# Patient Record
Sex: Male | Born: 1960 | Race: Black or African American | Hispanic: No | Marital: Single | Smoking: Heavy tobacco smoker
Health system: Southern US, Community
[De-identification: ages and names within clinical notes are randomized; demographics above are authoritative.]

## PROBLEM LIST (undated history)

## (undated) DIAGNOSIS — N189 Chronic kidney disease, unspecified: Secondary | ICD-10-CM

## (undated) DIAGNOSIS — N419 Inflammatory disease of prostate, unspecified: Secondary | ICD-10-CM

## (undated) DIAGNOSIS — M549 Dorsalgia, unspecified: Secondary | ICD-10-CM

## (undated) DIAGNOSIS — M542 Cervicalgia: Secondary | ICD-10-CM

## (undated) DIAGNOSIS — I10 Essential (primary) hypertension: Secondary | ICD-10-CM

## (undated) DIAGNOSIS — Z8711 Personal history of peptic ulcer disease: Secondary | ICD-10-CM

## (undated) DIAGNOSIS — E119 Type 2 diabetes mellitus without complications: Secondary | ICD-10-CM

## (undated) DIAGNOSIS — G8929 Other chronic pain: Secondary | ICD-10-CM

## (undated) DIAGNOSIS — B1920 Unspecified viral hepatitis C without hepatic coma: Secondary | ICD-10-CM

## (undated) DIAGNOSIS — G629 Polyneuropathy, unspecified: Secondary | ICD-10-CM

## (undated) DIAGNOSIS — Z8719 Personal history of other diseases of the digestive system: Secondary | ICD-10-CM

## (undated) DIAGNOSIS — J40 Bronchitis, not specified as acute or chronic: Secondary | ICD-10-CM

## (undated) DIAGNOSIS — K859 Acute pancreatitis without necrosis or infection, unspecified: Secondary | ICD-10-CM

## (undated) DIAGNOSIS — Z87898 Personal history of other specified conditions: Secondary | ICD-10-CM

## (undated) DIAGNOSIS — T8859XA Other complications of anesthesia, initial encounter: Secondary | ICD-10-CM

## (undated) DIAGNOSIS — Z8489 Family history of other specified conditions: Secondary | ICD-10-CM

## (undated) DIAGNOSIS — K862 Cyst of pancreas: Secondary | ICD-10-CM

## (undated) DIAGNOSIS — R0602 Shortness of breath: Secondary | ICD-10-CM

## (undated) DIAGNOSIS — K219 Gastro-esophageal reflux disease without esophagitis: Secondary | ICD-10-CM

## (undated) DIAGNOSIS — T4145XA Adverse effect of unspecified anesthetic, initial encounter: Secondary | ICD-10-CM

## (undated) DIAGNOSIS — E162 Hypoglycemia, unspecified: Secondary | ICD-10-CM

## (undated) DIAGNOSIS — C259 Malignant neoplasm of pancreas, unspecified: Secondary | ICD-10-CM

## (undated) DIAGNOSIS — I209 Angina pectoris, unspecified: Secondary | ICD-10-CM

## (undated) DIAGNOSIS — R768 Other specified abnormal immunological findings in serum: Secondary | ICD-10-CM

## (undated) HISTORY — DX: Personal history of other specified conditions: Z87.898

## (undated) HISTORY — DX: Essential (primary) hypertension: I10

## (undated) HISTORY — DX: Other chronic pain: G89.29

## (undated) HISTORY — DX: Other specified abnormal immunological findings in serum: R76.8

## (undated) HISTORY — DX: Chronic kidney disease, unspecified: N18.9

## (undated) HISTORY — DX: Cyst of pancreas: K86.2

## (undated) HISTORY — DX: Bronchitis, not specified as acute or chronic: J40

## (undated) HISTORY — PX: HERNIA REPAIR: SHX51

## (undated) HISTORY — DX: Gastro-esophageal reflux disease without esophagitis: K21.9

## (undated) HISTORY — PX: PANCREATIC CYST DRAINAGE: SHX2156

---
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
## 1997-09-09 ENCOUNTER — Emergency Department (HOSPITAL_COMMUNITY): Admission: EM | Admit: 1997-09-09 | Discharge: 1997-09-10 | Payer: Self-pay | Admitting: Emergency Medicine

## 1997-09-17 ENCOUNTER — Emergency Department (HOSPITAL_COMMUNITY): Admission: EM | Admit: 1997-09-17 | Discharge: 1997-09-17 | Payer: Self-pay | Admitting: Emergency Medicine

## 1997-09-18 ENCOUNTER — Emergency Department (HOSPITAL_COMMUNITY): Admission: EM | Admit: 1997-09-18 | Discharge: 1997-09-18 | Payer: Self-pay | Admitting: Emergency Medicine

## 1998-09-30 ENCOUNTER — Emergency Department (HOSPITAL_COMMUNITY): Admission: EM | Admit: 1998-09-30 | Discharge: 1998-09-30 | Payer: Self-pay | Admitting: *Deleted

## 1998-12-09 ENCOUNTER — Emergency Department (HOSPITAL_COMMUNITY): Admission: EM | Admit: 1998-12-09 | Discharge: 1998-12-09 | Payer: Self-pay | Admitting: Emergency Medicine

## 1999-02-19 ENCOUNTER — Emergency Department (HOSPITAL_COMMUNITY): Admission: EM | Admit: 1999-02-19 | Discharge: 1999-02-19 | Payer: Self-pay | Admitting: Emergency Medicine

## 1999-03-04 ENCOUNTER — Ambulatory Visit (HOSPITAL_COMMUNITY): Admission: RE | Admit: 1999-03-04 | Discharge: 1999-03-04 | Payer: Self-pay | Admitting: *Deleted

## 1999-03-04 ENCOUNTER — Encounter: Payer: Self-pay | Admitting: *Deleted

## 1999-05-20 ENCOUNTER — Emergency Department (HOSPITAL_COMMUNITY): Admission: EM | Admit: 1999-05-20 | Discharge: 1999-05-20 | Payer: Self-pay | Admitting: Emergency Medicine

## 2000-01-11 ENCOUNTER — Emergency Department (HOSPITAL_COMMUNITY): Admission: EM | Admit: 2000-01-11 | Discharge: 2000-01-11 | Payer: Self-pay | Admitting: Emergency Medicine

## 2000-01-17 ENCOUNTER — Emergency Department (HOSPITAL_COMMUNITY): Admission: EM | Admit: 2000-01-17 | Discharge: 2000-01-17 | Payer: Self-pay

## 2000-01-23 ENCOUNTER — Emergency Department (HOSPITAL_COMMUNITY): Admission: EM | Admit: 2000-01-23 | Discharge: 2000-01-24 | Payer: Self-pay | Admitting: Emergency Medicine

## 2000-01-24 ENCOUNTER — Encounter: Payer: Self-pay | Admitting: Emergency Medicine

## 2000-01-26 ENCOUNTER — Emergency Department (HOSPITAL_COMMUNITY): Admission: EM | Admit: 2000-01-26 | Discharge: 2000-01-27 | Payer: Self-pay | Admitting: Emergency Medicine

## 2000-01-29 ENCOUNTER — Emergency Department (HOSPITAL_COMMUNITY): Admission: EM | Admit: 2000-01-29 | Discharge: 2000-01-29 | Payer: Self-pay

## 2000-02-02 ENCOUNTER — Encounter: Admission: RE | Admit: 2000-02-02 | Discharge: 2000-02-02 | Payer: Self-pay | Admitting: Internal Medicine

## 2000-02-16 ENCOUNTER — Encounter: Admission: RE | Admit: 2000-02-16 | Discharge: 2000-02-16 | Payer: Self-pay | Admitting: Internal Medicine

## 2000-02-23 ENCOUNTER — Encounter: Admission: RE | Admit: 2000-02-23 | Discharge: 2000-02-23 | Payer: Self-pay

## 2000-03-30 ENCOUNTER — Encounter: Admission: RE | Admit: 2000-03-30 | Discharge: 2000-03-30 | Payer: Self-pay | Admitting: Internal Medicine

## 2000-04-09 ENCOUNTER — Encounter: Admission: RE | Admit: 2000-04-09 | Discharge: 2000-04-09 | Payer: Self-pay

## 2000-04-12 ENCOUNTER — Encounter: Admission: RE | Admit: 2000-04-12 | Discharge: 2000-04-12 | Payer: Self-pay | Admitting: Internal Medicine

## 2000-04-14 ENCOUNTER — Encounter: Admission: RE | Admit: 2000-04-14 | Discharge: 2000-04-14 | Payer: Self-pay | Admitting: Hematology and Oncology

## 2000-07-27 ENCOUNTER — Emergency Department (HOSPITAL_COMMUNITY): Admission: EM | Admit: 2000-07-27 | Discharge: 2000-07-27 | Payer: Self-pay | Admitting: *Deleted

## 2000-09-05 ENCOUNTER — Emergency Department (HOSPITAL_COMMUNITY): Admission: EM | Admit: 2000-09-05 | Discharge: 2000-09-06 | Payer: Self-pay

## 2000-10-14 ENCOUNTER — Encounter: Admission: RE | Admit: 2000-10-14 | Discharge: 2000-10-14 | Payer: Self-pay | Admitting: Internal Medicine

## 2001-03-24 ENCOUNTER — Emergency Department (HOSPITAL_COMMUNITY): Admission: EM | Admit: 2001-03-24 | Discharge: 2001-03-24 | Payer: Self-pay | Admitting: *Deleted

## 2001-04-06 ENCOUNTER — Emergency Department (HOSPITAL_COMMUNITY): Admission: EM | Admit: 2001-04-06 | Discharge: 2001-04-06 | Payer: Self-pay | Admitting: *Deleted

## 2001-05-23 ENCOUNTER — Encounter: Admission: RE | Admit: 2001-05-23 | Discharge: 2001-05-23 | Payer: Self-pay

## 2001-06-08 ENCOUNTER — Emergency Department (HOSPITAL_COMMUNITY): Admission: EM | Admit: 2001-06-08 | Discharge: 2001-06-08 | Payer: Self-pay | Admitting: Emergency Medicine

## 2001-07-17 ENCOUNTER — Emergency Department (HOSPITAL_COMMUNITY): Admission: EM | Admit: 2001-07-17 | Discharge: 2001-07-17 | Payer: Self-pay | Admitting: Emergency Medicine

## 2001-07-18 ENCOUNTER — Encounter: Admission: RE | Admit: 2001-07-18 | Discharge: 2001-07-18 | Payer: Self-pay | Admitting: Internal Medicine

## 2001-07-26 ENCOUNTER — Encounter: Admission: RE | Admit: 2001-07-26 | Discharge: 2001-07-26 | Payer: Self-pay | Admitting: Internal Medicine

## 2001-08-10 ENCOUNTER — Encounter: Admission: RE | Admit: 2001-08-10 | Discharge: 2001-08-10 | Payer: Self-pay | Admitting: Internal Medicine

## 2001-09-01 ENCOUNTER — Encounter: Admission: RE | Admit: 2001-09-01 | Discharge: 2001-09-01 | Payer: Self-pay | Admitting: Internal Medicine

## 2001-09-16 ENCOUNTER — Ambulatory Visit (HOSPITAL_COMMUNITY): Admission: RE | Admit: 2001-09-16 | Discharge: 2001-09-16 | Payer: Self-pay | Admitting: Urology

## 2001-09-16 ENCOUNTER — Encounter: Payer: Self-pay | Admitting: Urology

## 2001-10-25 ENCOUNTER — Encounter: Admission: RE | Admit: 2001-10-25 | Discharge: 2001-10-25 | Payer: Self-pay | Admitting: Internal Medicine

## 2001-10-30 ENCOUNTER — Emergency Department (HOSPITAL_COMMUNITY): Admission: EM | Admit: 2001-10-30 | Discharge: 2001-10-31 | Payer: Self-pay | Admitting: Emergency Medicine

## 2001-11-29 ENCOUNTER — Encounter: Admission: RE | Admit: 2001-11-29 | Discharge: 2001-11-29 | Payer: Self-pay | Admitting: Internal Medicine

## 2001-12-29 ENCOUNTER — Encounter: Admission: RE | Admit: 2001-12-29 | Discharge: 2001-12-29 | Payer: Self-pay | Admitting: Internal Medicine

## 2002-02-02 ENCOUNTER — Encounter: Admission: RE | Admit: 2002-02-02 | Discharge: 2002-02-02 | Payer: Self-pay | Admitting: Internal Medicine

## 2002-03-07 ENCOUNTER — Emergency Department (HOSPITAL_COMMUNITY): Admission: EM | Admit: 2002-03-07 | Discharge: 2002-03-08 | Payer: Self-pay | Admitting: Emergency Medicine

## 2002-03-08 ENCOUNTER — Encounter: Payer: Self-pay | Admitting: Emergency Medicine

## 2002-08-09 ENCOUNTER — Emergency Department (HOSPITAL_COMMUNITY): Admission: EM | Admit: 2002-08-09 | Discharge: 2002-08-10 | Payer: Self-pay | Admitting: Emergency Medicine

## 2002-12-25 ENCOUNTER — Emergency Department (HOSPITAL_COMMUNITY): Admission: EM | Admit: 2002-12-25 | Discharge: 2002-12-25 | Payer: Self-pay | Admitting: Emergency Medicine

## 2003-02-14 ENCOUNTER — Emergency Department (HOSPITAL_COMMUNITY): Admission: EM | Admit: 2003-02-14 | Discharge: 2003-02-14 | Payer: Self-pay

## 2003-04-19 ENCOUNTER — Emergency Department (HOSPITAL_COMMUNITY): Admission: EM | Admit: 2003-04-19 | Discharge: 2003-04-19 | Payer: Self-pay | Admitting: Emergency Medicine

## 2003-10-15 ENCOUNTER — Emergency Department (HOSPITAL_COMMUNITY): Admission: EM | Admit: 2003-10-15 | Discharge: 2003-10-15 | Payer: Self-pay | Admitting: Emergency Medicine

## 2004-12-21 ENCOUNTER — Emergency Department (HOSPITAL_COMMUNITY): Admission: EM | Admit: 2004-12-21 | Discharge: 2004-12-21 | Payer: Self-pay | Admitting: Emergency Medicine

## 2005-03-13 ENCOUNTER — Emergency Department (HOSPITAL_COMMUNITY): Admission: EM | Admit: 2005-03-13 | Discharge: 2005-03-13 | Payer: Self-pay | Admitting: Emergency Medicine

## 2005-03-16 ENCOUNTER — Emergency Department (HOSPITAL_COMMUNITY): Admission: EM | Admit: 2005-03-16 | Discharge: 2005-03-16 | Payer: Self-pay | Admitting: Family Medicine

## 2005-09-15 ENCOUNTER — Emergency Department (HOSPITAL_COMMUNITY): Admission: EM | Admit: 2005-09-15 | Discharge: 2005-09-15 | Payer: Self-pay | Admitting: Emergency Medicine

## 2005-09-18 ENCOUNTER — Ambulatory Visit: Payer: Self-pay | Admitting: Internal Medicine

## 2005-09-18 ENCOUNTER — Ambulatory Visit (HOSPITAL_COMMUNITY): Admission: RE | Admit: 2005-09-18 | Discharge: 2005-09-18 | Payer: Self-pay | Admitting: Internal Medicine

## 2005-10-02 ENCOUNTER — Ambulatory Visit: Payer: Self-pay | Admitting: Internal Medicine

## 2006-01-19 HISTORY — PX: CARDIAC CATHETERIZATION: SHX172

## 2006-09-01 ENCOUNTER — Emergency Department (HOSPITAL_COMMUNITY): Admission: EM | Admit: 2006-09-01 | Discharge: 2006-09-01 | Payer: Self-pay | Admitting: Emergency Medicine

## 2006-09-08 ENCOUNTER — Emergency Department (HOSPITAL_COMMUNITY): Admission: EM | Admit: 2006-09-08 | Discharge: 2006-09-08 | Payer: Self-pay | Admitting: Emergency Medicine

## 2006-11-17 ENCOUNTER — Emergency Department (HOSPITAL_COMMUNITY): Admission: EM | Admit: 2006-11-17 | Discharge: 2006-11-18 | Payer: Self-pay | Admitting: Emergency Medicine

## 2007-01-06 ENCOUNTER — Ambulatory Visit: Payer: Self-pay | Admitting: Internal Medicine

## 2007-01-06 ENCOUNTER — Observation Stay (HOSPITAL_COMMUNITY): Admission: EM | Admit: 2007-01-06 | Discharge: 2007-01-06 | Payer: Self-pay | Admitting: Emergency Medicine

## 2007-01-06 ENCOUNTER — Encounter (INDEPENDENT_AMBULATORY_CARE_PROVIDER_SITE_OTHER): Payer: Self-pay | Admitting: Cardiovascular Disease

## 2007-01-24 ENCOUNTER — Telehealth: Payer: Self-pay | Admitting: Internal Medicine

## 2007-01-24 ENCOUNTER — Ambulatory Visit: Payer: Self-pay | Admitting: Internal Medicine

## 2007-01-24 DIAGNOSIS — I10 Essential (primary) hypertension: Secondary | ICD-10-CM | POA: Insufficient documentation

## 2007-01-24 DIAGNOSIS — K219 Gastro-esophageal reflux disease without esophagitis: Secondary | ICD-10-CM

## 2007-01-25 ENCOUNTER — Encounter (INDEPENDENT_AMBULATORY_CARE_PROVIDER_SITE_OTHER): Payer: Self-pay | Admitting: Internal Medicine

## 2007-01-28 ENCOUNTER — Encounter (INDEPENDENT_AMBULATORY_CARE_PROVIDER_SITE_OTHER): Payer: Self-pay | Admitting: Internal Medicine

## 2007-01-28 LAB — CONVERTED CEMR LAB
Albumin: 4.9 g/dL (ref 3.5–5.2)
CO2: 28 meq/L (ref 19–32)
Calcium: 9.8 mg/dL (ref 8.4–10.5)
Chloride: 98 meq/L (ref 96–112)
Eosinophils Relative: 2 % (ref 0–5)
Glucose, Bld: 97 mg/dL (ref 70–99)
HCT: 44.7 % (ref 39.0–52.0)
Hemoglobin, Urine: NEGATIVE
Hemoglobin: 15.5 g/dL (ref 13.0–17.0)
Ketones, ur: NEGATIVE mg/dL
Leukocytes, UA: NEGATIVE
Lipase: 51 units/L (ref 0–75)
Lymphocytes Relative: 47 % — ABNORMAL HIGH (ref 12–46)
Lymphs Abs: 3.1 10*3/uL (ref 0.7–4.0)
Neutro Abs: 2.7 10*3/uL (ref 1.7–7.7)
Nitrite: NEGATIVE
Platelets: 297 10*3/uL (ref 150–400)
Protein, ur: 100 mg/dL — AB
Sodium: 139 meq/L (ref 135–145)
Total Bilirubin: 0.8 mg/dL (ref 0.3–1.2)
Total Protein: 7.8 g/dL (ref 6.0–8.3)
Urobilinogen, UA: 1 (ref 0.0–1.0)
WBC: 6.6 10*3/uL (ref 4.0–10.5)

## 2007-02-22 ENCOUNTER — Emergency Department (HOSPITAL_COMMUNITY): Admission: EM | Admit: 2007-02-22 | Discharge: 2007-02-22 | Payer: Self-pay | Admitting: Family Medicine

## 2007-03-09 ENCOUNTER — Emergency Department (HOSPITAL_COMMUNITY): Admission: EM | Admit: 2007-03-09 | Discharge: 2007-03-09 | Payer: Self-pay | Admitting: Emergency Medicine

## 2007-05-05 ENCOUNTER — Emergency Department (HOSPITAL_COMMUNITY): Admission: EM | Admit: 2007-05-05 | Discharge: 2007-05-05 | Payer: Self-pay | Admitting: Family Medicine

## 2007-05-23 ENCOUNTER — Emergency Department (HOSPITAL_COMMUNITY): Admission: EM | Admit: 2007-05-23 | Discharge: 2007-05-23 | Payer: Self-pay | Admitting: Emergency Medicine

## 2007-07-08 ENCOUNTER — Emergency Department (HOSPITAL_COMMUNITY): Admission: EM | Admit: 2007-07-08 | Discharge: 2007-07-08 | Payer: Self-pay | Admitting: Emergency Medicine

## 2007-07-26 ENCOUNTER — Ambulatory Visit: Payer: Self-pay | Admitting: Internal Medicine

## 2007-07-26 ENCOUNTER — Encounter (INDEPENDENT_AMBULATORY_CARE_PROVIDER_SITE_OTHER): Payer: Self-pay | Admitting: Internal Medicine

## 2007-07-26 DIAGNOSIS — Z87898 Personal history of other specified conditions: Secondary | ICD-10-CM

## 2007-07-26 HISTORY — DX: Personal history of other specified conditions: Z87.898

## 2007-08-02 LAB — CONVERTED CEMR LAB
CO2: 25 meq/L (ref 19–32)
Calcium: 9.8 mg/dL (ref 8.4–10.5)
Chloride: 96 meq/L (ref 96–112)
Creatinine, Ser: 1.26 mg/dL (ref 0.40–1.50)
Glucose, Bld: 99 mg/dL (ref 70–99)
Potassium: 3.8 meq/L (ref 3.5–5.3)
Sodium: 136 meq/L (ref 135–145)

## 2007-08-03 ENCOUNTER — Ambulatory Visit: Payer: Self-pay | Admitting: Infectious Diseases

## 2007-08-03 ENCOUNTER — Encounter (INDEPENDENT_AMBULATORY_CARE_PROVIDER_SITE_OTHER): Payer: Self-pay | Admitting: Internal Medicine

## 2007-09-20 ENCOUNTER — Emergency Department (HOSPITAL_COMMUNITY): Admission: EM | Admit: 2007-09-20 | Discharge: 2007-09-20 | Payer: Self-pay | Admitting: Family Medicine

## 2007-12-13 ENCOUNTER — Emergency Department (HOSPITAL_COMMUNITY): Admission: EM | Admit: 2007-12-13 | Discharge: 2007-12-13 | Payer: Self-pay | Admitting: Family Medicine

## 2008-02-13 ENCOUNTER — Telehealth (INDEPENDENT_AMBULATORY_CARE_PROVIDER_SITE_OTHER): Payer: Self-pay | Admitting: Internal Medicine

## 2008-02-27 ENCOUNTER — Emergency Department (HOSPITAL_COMMUNITY): Admission: EM | Admit: 2008-02-27 | Discharge: 2008-02-27 | Payer: Self-pay | Admitting: Emergency Medicine

## 2008-03-30 ENCOUNTER — Telehealth (INDEPENDENT_AMBULATORY_CARE_PROVIDER_SITE_OTHER): Payer: Self-pay | Admitting: Internal Medicine

## 2008-06-06 ENCOUNTER — Emergency Department (HOSPITAL_COMMUNITY): Admission: EM | Admit: 2008-06-06 | Discharge: 2008-06-06 | Payer: Self-pay | Admitting: Family Medicine

## 2008-06-06 ENCOUNTER — Ambulatory Visit: Payer: Self-pay | Admitting: Infectious Disease

## 2008-06-06 ENCOUNTER — Encounter (INDEPENDENT_AMBULATORY_CARE_PROVIDER_SITE_OTHER): Payer: Self-pay | Admitting: Internal Medicine

## 2008-06-06 DIAGNOSIS — D409 Neoplasm of uncertain behavior of male genital organ, unspecified: Secondary | ICD-10-CM

## 2008-06-06 LAB — CONVERTED CEMR LAB
Basophils Absolute: 0 10*3/uL
Basophils Relative: 1 %
Bilirubin Urine: NEGATIVE
Chlamydia, Swab/Urine, PCR: NEGATIVE
Cholesterol: 197 mg/dL
Eosinophils Absolute: 0.1 10*3/uL
Eosinophils Relative: 1 %
GC Probe Amp, Urine: NEGATIVE
HCT: 46.9 %
HDL: 46 mg/dL
Hemoglobin, Urine: NEGATIVE
Hemoglobin: 16.2 g/dL
Ketones, ur: NEGATIVE mg/dL
LDL Cholesterol: 132 mg/dL — ABNORMAL HIGH
Leukocytes, UA: NEGATIVE
Lymphocytes Relative: 50 % — ABNORMAL HIGH
Lymphs Abs: 3.1 10*3/uL
MCHC: 34.5 g/dL
MCV: 89 fL
Monocytes Absolute: 0.7 10*3/uL
Monocytes Relative: 10 %
Neutro Abs: 2.4 10*3/uL
Neutrophils Relative %: 38 % — ABNORMAL LOW
Nitrite: NEGATIVE
Platelets: 316 10*3/uL
Protein, ur: 100 mg/dL — AB
RBC / HPF: NONE SEEN
RBC: 5.27 M/uL
RDW: 13.4 %
Specific Gravity, Urine: 1.028
Total CHOL/HDL Ratio: 4.3
Triglycerides: 97 mg/dL
Urine Glucose: NEGATIVE mg/dL
Urobilinogen, UA: 1
VLDL: 19 mg/dL
WBC: 6.3 10*3/uL
pH: 5.5

## 2008-06-19 ENCOUNTER — Encounter (INDEPENDENT_AMBULATORY_CARE_PROVIDER_SITE_OTHER): Payer: Self-pay | Admitting: Internal Medicine

## 2008-06-19 ENCOUNTER — Ambulatory Visit: Payer: Self-pay | Admitting: Infectious Disease

## 2008-06-20 LAB — CONVERTED CEMR LAB
Calcium: 10.9 mg/dL — ABNORMAL HIGH (ref 8.4–10.5)
Creatinine, Ser: 1.55 mg/dL — ABNORMAL HIGH (ref 0.40–1.50)
GFR calc Af Amer: 58 mL/min — ABNORMAL LOW (ref >60)
GFR calc non Af Amer: 48 mL/min — ABNORMAL LOW (ref >60)
Sodium: 138 meq/L (ref 135–145)

## 2008-06-25 ENCOUNTER — Encounter (INDEPENDENT_AMBULATORY_CARE_PROVIDER_SITE_OTHER): Payer: Self-pay | Admitting: Internal Medicine

## 2008-06-25 ENCOUNTER — Ambulatory Visit: Payer: Self-pay | Admitting: Internal Medicine

## 2008-06-25 LAB — CONVERTED CEMR LAB
Calcium: 9.6 mg/dL (ref 8.4–10.5)
GFR calc Af Amer: >60 mL/min (ref >60)
GFR calc non Af Amer: >60 mL/min (ref >60)
Glucose, Bld: 106 mg/dL — ABNORMAL HIGH (ref 70–99)
Potassium: 3.9 meq/L (ref 3.5–5.3)
Sodium: 138 meq/L (ref 135–145)

## 2008-07-16 ENCOUNTER — Telehealth: Payer: Self-pay | Admitting: *Deleted

## 2008-07-16 ENCOUNTER — Encounter (INDEPENDENT_AMBULATORY_CARE_PROVIDER_SITE_OTHER): Payer: Self-pay | Admitting: Internal Medicine

## 2008-07-25 ENCOUNTER — Telehealth (INDEPENDENT_AMBULATORY_CARE_PROVIDER_SITE_OTHER): Payer: Self-pay | Admitting: Internal Medicine

## 2008-11-13 ENCOUNTER — Telehealth (INDEPENDENT_AMBULATORY_CARE_PROVIDER_SITE_OTHER): Payer: Self-pay | Admitting: Internal Medicine

## 2009-01-16 ENCOUNTER — Telehealth (INDEPENDENT_AMBULATORY_CARE_PROVIDER_SITE_OTHER): Payer: Self-pay | Admitting: Internal Medicine

## 2009-02-06 ENCOUNTER — Telehealth (INDEPENDENT_AMBULATORY_CARE_PROVIDER_SITE_OTHER): Payer: Self-pay | Admitting: Internal Medicine

## 2009-02-12 ENCOUNTER — Emergency Department (HOSPITAL_COMMUNITY): Admission: EM | Admit: 2009-02-12 | Discharge: 2009-02-12 | Payer: Self-pay | Admitting: Emergency Medicine

## 2009-02-28 ENCOUNTER — Ambulatory Visit: Payer: Self-pay | Admitting: Internal Medicine

## 2009-03-06 ENCOUNTER — Telehealth (INDEPENDENT_AMBULATORY_CARE_PROVIDER_SITE_OTHER): Payer: Self-pay | Admitting: Internal Medicine

## 2009-04-06 ENCOUNTER — Emergency Department (HOSPITAL_COMMUNITY): Admission: EM | Admit: 2009-04-06 | Discharge: 2009-04-06 | Payer: Self-pay | Admitting: Family Medicine

## 2009-04-11 ENCOUNTER — Encounter (INDEPENDENT_AMBULATORY_CARE_PROVIDER_SITE_OTHER): Payer: Self-pay | Admitting: Internal Medicine

## 2009-04-11 ENCOUNTER — Telehealth: Payer: Self-pay | Admitting: Internal Medicine

## 2009-05-22 ENCOUNTER — Telehealth (INDEPENDENT_AMBULATORY_CARE_PROVIDER_SITE_OTHER): Payer: Self-pay | Admitting: Internal Medicine

## 2009-06-24 ENCOUNTER — Telehealth: Payer: Self-pay | Admitting: *Deleted

## 2009-06-25 ENCOUNTER — Ambulatory Visit: Payer: Self-pay | Admitting: Internal Medicine

## 2009-07-30 ENCOUNTER — Emergency Department (HOSPITAL_COMMUNITY): Admission: EM | Admit: 2009-07-30 | Discharge: 2009-07-30 | Payer: Self-pay | Admitting: Emergency Medicine

## 2009-08-12 ENCOUNTER — Telehealth (INDEPENDENT_AMBULATORY_CARE_PROVIDER_SITE_OTHER): Payer: Self-pay | Admitting: *Deleted

## 2009-08-12 ENCOUNTER — Telehealth: Payer: Self-pay | Admitting: Internal Medicine

## 2009-08-15 ENCOUNTER — Encounter: Payer: Self-pay | Admitting: Licensed Clinical Social Worker

## 2009-08-15 ENCOUNTER — Ambulatory Visit: Payer: Self-pay | Admitting: Internal Medicine

## 2009-08-15 DIAGNOSIS — F17200 Nicotine dependence, unspecified, uncomplicated: Secondary | ICD-10-CM | POA: Insufficient documentation

## 2009-08-15 LAB — CONVERTED CEMR LAB
ALT: 39 units/L (ref 0–53)
AST: 21 units/L (ref 0–37)
Albumin: 4.5 g/dL (ref 3.5–5.2)
Alkaline Phosphatase: 82 units/L (ref 39–117)
BUN: 14 mg/dL (ref 6–23)
HDL: 49 mg/dL (ref >39)
LDL Cholesterol: 133 mg/dL — ABNORMAL HIGH (ref 0–99)
Potassium: 4.3 meq/L (ref 3.5–5.3)
Total CHOL/HDL Ratio: 4.2

## 2009-09-03 DIAGNOSIS — J45909 Unspecified asthma, uncomplicated: Secondary | ICD-10-CM | POA: Insufficient documentation

## 2009-09-03 DIAGNOSIS — I2510 Atherosclerotic heart disease of native coronary artery without angina pectoris: Secondary | ICD-10-CM

## 2009-09-10 ENCOUNTER — Ambulatory Visit: Payer: Self-pay | Admitting: Cardiovascular Disease

## 2009-10-01 ENCOUNTER — Ambulatory Visit: Payer: Self-pay

## 2009-10-01 ENCOUNTER — Ambulatory Visit: Payer: Self-pay | Admitting: Cardiovascular Disease

## 2009-10-17 ENCOUNTER — Telehealth (INDEPENDENT_AMBULATORY_CARE_PROVIDER_SITE_OTHER): Payer: Self-pay | Admitting: *Deleted

## 2009-10-23 ENCOUNTER — Ambulatory Visit: Payer: Self-pay | Admitting: Cardiovascular Disease

## 2009-10-23 LAB — CONVERTED CEMR LAB
Calcium: 9.7 mg/dL (ref 8.4–10.5)
Chloride: 105 meq/L (ref 96–112)
Creatinine, Ser: 1.1 mg/dL (ref 0.4–1.5)
Sodium: 141 meq/L (ref 135–145)

## 2010-01-08 ENCOUNTER — Telehealth: Payer: Self-pay | Admitting: Internal Medicine

## 2010-01-15 ENCOUNTER — Ambulatory Visit: Admission: RE | Admit: 2010-01-15 | Discharge: 2010-01-15 | Payer: Self-pay | Source: Home / Self Care

## 2010-01-15 DIAGNOSIS — R300 Dysuria: Secondary | ICD-10-CM | POA: Insufficient documentation

## 2010-01-15 DIAGNOSIS — R809 Proteinuria, unspecified: Secondary | ICD-10-CM

## 2010-01-15 LAB — CONVERTED CEMR LAB
BUN: 9 mg/dL (ref 6–23)
CO2: 29 meq/L (ref 19–32)
Chlamydia, DNA Probe: NEGATIVE
Chloride: 104 meq/L (ref 96–112)
Glucose, Bld: 99 mg/dL (ref 70–99)
Glucose, Urine, Semiquant: NEGATIVE
Lymphocytes Relative: 44 % (ref 12–46)
Lymphs Abs: 3.2 10*3/uL (ref 0.7–4.0)
Monocytes Relative: 8 % (ref 3–12)
Neutro Abs: 3.4 10*3/uL (ref 1.7–7.7)
Neutrophils Relative %: 46 % (ref 43–77)
Nitrite: NEGATIVE
Potassium: 4.2 meq/L (ref 3.5–5.3)
RBC: 5.47 M/uL (ref 4.22–5.81)
Specific Gravity, Urine: 1.03 (ref 1.005–1.03)
Urine Glucose: NEGATIVE mg/dL
Urobilinogen, UA: 1
WBC: 7.3 10*3/uL (ref 4.0–10.5)
pH: 5
pH: 5.5 (ref 5.0–8.0)

## 2010-01-19 HISTORY — PX: CARDIAC CATHETERIZATION: SHX172

## 2010-02-07 ENCOUNTER — Telehealth (INDEPENDENT_AMBULATORY_CARE_PROVIDER_SITE_OTHER): Payer: Self-pay | Admitting: *Deleted

## 2010-02-13 ENCOUNTER — Ambulatory Visit: Admission: RE | Admit: 2010-02-13 | Discharge: 2010-02-13 | Payer: Self-pay | Source: Home / Self Care

## 2010-02-13 DIAGNOSIS — N419 Inflammatory disease of prostate, unspecified: Secondary | ICD-10-CM | POA: Insufficient documentation

## 2010-02-13 LAB — CONVERTED CEMR LAB
ALT: 41 units/L (ref 0–53)
Alkaline Phosphatase: 93 units/L (ref 39–117)
Bilirubin Urine: NEGATIVE
CO2: 27 meq/L (ref 19–32)
Casts: NONE SEEN /lpf
Creatinine, Ser: 1.32 mg/dL (ref 0.40–1.50)
Eosinophils Absolute: 0.1 10*3/uL (ref 0.0–0.7)
Eosinophils Relative: 2 % (ref 0–5)
HCT: 47.9 % (ref 39.0–52.0)
Ketones, ur: NEGATIVE mg/dL
Lymphs Abs: 4 10*3/uL (ref 0.7–4.0)
MCHC: 35.7 g/dL (ref 30.0–36.0)
MCV: 89 fL (ref 78.0–100.0)
Monocytes Relative: 10 % (ref 3–12)
Platelets: 286 10*3/uL (ref 150–400)
RBC: 5.38 M/uL (ref 4.22–5.81)
Total Bilirubin: 0.8 mg/dL (ref 0.3–1.2)
Urine Glucose: NEGATIVE mg/dL
WBC: 7.6 10*3/uL (ref 4.0–10.5)
pH: 5.5 (ref 5.0–8.0)

## 2010-02-20 NOTE — Medication Information (Signed)
Summary: [place] [employer]  [place] [employer]   Imported By: Florinda Marker 04/15/2009 10:36:56  _____________________________________________________________________  External Attachment:    Type:   Image     Comment:   External Document

## 2010-02-20 NOTE — Assessment & Plan Note (Signed)
Summary: PROSTATE/SB.   Vital Signs:  Patient profile:   50 year old male Height:      67 inches (170.18 cm) Weight:      179.7 pounds (81.68 kg) BMI:     28.25 Temp:     100.2 degrees F (37.89 degrees C) oral Pulse rate:   88 / minute BP sitting:   140 / 88  (left arm)  Vitals Entered By: Stanton Kidney Ditzler RN (January 15, 2010 1:44 PM) Is Patient Diabetic? No Pain Assessment Patient in pain? yes     Location: left side prostate Intensity: 5 Type: sharp Onset of pain  off and on past week Nutritional Status BMI of 25 - 29 = overweight Nutritional Status Detail appetite good  Have you ever been in a relationship where you felt threatened, hurt or afraid?denies   Does patient need assistance? Functional Status Self care Ambulation Normal Comments Leaking and urgency urination. Accidents at night. Sl discharge. ? knot off and on.   Primary Care Provider:  Rosana Berger MD   History of Present Illness: 50 yo male with PMH outlined below presents to [facility] with main concern of 2 week dysuria, urinary frequency, urgency, dribbling, and sense of incomplete voiding. This has been getting worse and pain is not 7/10 in severity radiating to the lower back area. Aggrevated by bowel movements and urination. He has had similar episodes in the past but can not recall the exact cause of it. These episodes have been associtaed with fevers and chills but no other systemic concerns, no abd concerns. No recent changes in weight, appetite. He reports being sexualy active and has history of "some" STD, currently uses no protection. Denies blood in urine or stool.   Depression History:      The patient denies a depressed mood most of the day and a diminished interest in his usual daily activities.         Preventive Screening-Counseling & Management  Alcohol-Tobacco     Alcohol type: BEER AT TIMES     Smoking Status: current     Smoking Cessation Counseling: yes     Packs/Day: 2-3 cig  per  week     Year Quit: 01/06/07  Caffeine-Diet-Exercise     Does Patient Exercise: no  Problems Prior to Update: 1)  Limb Pain  (ICD-729.5) 2)  Cad, Native Vessel  (ICD-414.01) 3)  Chest Pain, Atypical  (ICD-786.59) 4)  Hypertension  (ICD-401.9) 5)  Tobacco Abuse  (ICD-305.1) 6)  Abdominal Pain, Epigastric  (ICD-789.06) 7)  Gerd  (ICD-530.81) 8)  Anxiety  (ICD-300.00) 9)  Asthma  (ICD-493.90) 10)  Penile Lesion  (ICD-236.6) 11)  Genital Herpes, Hx of  (ICD-V13.8)  Medications Prior to Update: 1)  Norvasc 10 Mg  Tabs (Amlodipine Besylate) .... Take 1 Pill By Mouth Daily. 2)  Ranitidine Hcl 300 Mg  Caps (Ranitidine Hcl) .... Take 1 Pill By Mouth Daily Before Bed 3)  Hydrochlorothiazide 25 Mg  Tabs (Hydrochlorothiazide) .... Take 1 Pill By Mouth Daily 4)  Ventolin Hfa 108 (90 Base) Mcg/act  Aers (Albuterol Sulfate) .... Take 2 Puffs Qid As Needed. 5)  Klor-Con M10 10 Meq  Tbcr (Potassium Chloride Crys Cr) .... Take 1 Pill By Mouth Daily. 6)  Valtrex 500 Mg  Tabs (Valacyclovir Hcl) .... Take 1 Pill By Mouth Twice Daily For 3 Days. 7)  Cialis 10 Mg Tabs (Tadalafil) .... Please Take One Pill By Mouth 30 Minutes Before Sexual Activity.  Current Medications (verified): 1)  Norvasc 10 Mg  Tabs (Amlodipine Besylate) .... Take 1 Pill By Mouth Daily. 2)  Ranitidine Hcl 300 Mg  Caps (Ranitidine Hcl) .... Take 1 Pill By Mouth Daily Before Bed 3)  Hydrochlorothiazide 25 Mg  Tabs (Hydrochlorothiazide) .... Take 1 Pill By Mouth Daily 4)  Ventolin Hfa 108 (90 Base) Mcg/act  Aers (Albuterol Sulfate) .... Take 2 Puffs Qid As Needed. 5)  Klor-Con M10 10 Meq  Tbcr (Potassium Chloride Crys Cr) .... Take 1 Pill By Mouth Daily. 6)  Valtrex 500 Mg  Tabs (Valacyclovir Hcl) .... Take 1 Pill By Mouth Twice Daily For 3 Days. 7)  Cialis 10 Mg Tabs (Tadalafil) .... Please Take One Pill By Mouth 30 Minutes Before Sexual Activity. 8)  Cipro 500 Mg Tabs (Ciprofloxacin Hcl) .... Take 1 Tablet By Mouth Two Times A Day  For 4 Weeks  Allergies: 1)  ! Benadryl  Past History:  Past Medical History: Last updated: 09/10/2009 HTN Multiple dental caries with extractions Asthma GERD Bronchitis Kidney stones Migraine Headaches  Past Surgical History: Last updated: 09/10/2009 Iinguinal hernia repair ? 1977  Cardiac cath December 2008 (Normal coronary arteries)  Family History: Last updated: 09/10/2009 Family History of Coronary Artery Disease:  Family History of CVA or Stroke:  Family History of Hypertension:   Mother-CVA/MI Father-MI Sister-MI  Social History: Last updated: 09/10/2009 Occupation:  Unemployed Current Smoker:  1ppd for 25 years. Alcohol use-yes:  1 beer/day Drug use-no Single 2 chiildren, healthy  Risk Factors: Exercise: no (01/15/2010)  Risk Factors: Smoking Status: current (01/15/2010) Packs/Day: 2-3 cig  per week (01/15/2010)  Family History: Reviewed history from 09/10/2009 and no changes required. Family History of Coronary Artery Disease:  Family History of CVA or Stroke:  Family History of Hypertension:   Mother-CVA/MI Father-MI Sister-MI  Social History: Reviewed history from 09/10/2009 and no changes required. Occupation:  Unemployed Current Smoker:  1ppd for 25 years. Alcohol use-yes:  1 beer/day Drug use-no Single 2 chiildren, healthy Packs/Day:  2-3 cig  per week  Review of Systems       per HPI  Physical Exam  General:  Well-developed,well-nourished,in no acute distress; alert,appropriate and cooperative throughout examination Lungs:  normal respiratory effort and no accessory muscle use.   Heart:  normal rate, regular rhythm, and no murmur.   Abdomen:  Bowel sounds positive,abdomen soft and non-tender without masses, organomegaly or hernias noted. Genitalia:  circumcised, no hydrocele, no varicocele, no scrotal masses, and no testicular masses or atrophy.  ?old wart lesion on penis, no active lesions noted, no discharge  noted Prostate:  no gland enlargement, no nodules, no asymmetry, and tender.   Inguinal Nodes:  No significant adenopathy   Impression & Recommendations:  Problem # 1:  PROTEINURIA (ICD-791.0)  Incidentally noted on urine dipstic, unclear etiology but will check UA and will also check bmp with GFR.   Orders: T-Urinalysis ([id number])  Problem # 2:  DYSURIA (ICD-788.1)  Symptoms most worrisome for acute prostatitis. Will treat with once IM dose of rocephin in case STD (given his high risk sexual behavior), will treat with courseof cipro for 4 weeks and will follow up. He was advised to come back to see [place] sooner if his symptoms do not improve or get worse and he cont to experience fevers. Will check bmp and will follow up on GFR and Cr, if results outside the range, will admite and treat inpatient. This is certainly worrisome for ascending infection but will first follow up on labs including cbc.  Orders: T-Urinalysis ([id number]) T-Culture, Urine ([id number]) T-Basic Metabolic Panel [id number]) T-CBC w/Diff [id number]) T-Chlamydia & GC Probe, Genital ([id number]) Admin of Therapeutic Inj  intramuscular or subcutaneous (51884) Rocephin  250mg  (Z6606)  His updated medication list for this problem includes:    Cipro 500 Mg Tabs (Ciprofloxacin hcl) .Marland Kitchen... Take 1 tablet by mouth two times a day for 4 weeks  Orders: T-Urinalysis ([id number]) T-Culture, Urine ([id number]) T-Basic Metabolic Panel ([id number]) T-CBC w/Diff ([id number]) T-Chlamydia & GC Probe, Genital ([id number]/[id number])  Problem # 3:  HYPERTENSION (ICD-401.9) Slightly above the goal and I suspect due to current condition. Will not make changes to his regimen but will follow upon bmet and will d/c K if indicated.  His updated medication list for this problem includes:    Norvasc 10 Mg Tabs (Amlodipine besylate) .Marland Kitchen... Take 1 pill by mouth daily.    Hydrochlorothiazide 25 Mg Tabs (Hydrochlorothiazide) .Marland Kitchen...  Take 1 pill by mouth daily  BP today: 140/88 Prior BP: 120/80 (10/23/2009)  Prior 10 Yr Risk Heart Disease: N/A (10/01/2009)  Labs Reviewed: K+: 5.2 (10/23/2009) Creat: : 1.1 (10/23/2009)   Chol: 205 (08/15/2009)   HDL: 49 (08/15/2009)   LDL: 133 (08/15/2009)   TG: 113 (08/15/2009)  Complete Medication List: 1)  Norvasc 10 Mg Tabs (Amlodipine besylate) .... Take 1 pill by mouth daily. 2)  Ranitidine Hcl 300 Mg Caps (Ranitidine hcl) .... Take 1 pill by mouth daily before bed 3)  Hydrochlorothiazide 25 Mg Tabs (Hydrochlorothiazide) .... Take 1 pill by mouth daily 4)  Ventolin Hfa 108 (90 Base) Mcg/act Aers (Albuterol sulfate) .... Take 2 puffs qid as needed. 5)  Klor-con M10 10 Meq Tbcr (Potassium chloride crys cr) .... Take 1 pill by mouth daily. 6)  Cialis 10 Mg Tabs (Tadalafil) .... Please take one pill by mouth 30 minutes before sexual activity. 7)  Cipro 500 Mg Tabs (Ciprofloxacin hcl) .... Take 1 tablet by mouth two times a day for 4 weeks  Other Orders: T-HIV Antibody  (Reflex) ([id number])  Patient Instructions: 1)  Please schedule a follow-up appointment in 2 weeks. 2)  Please check your blood pressure regularly, if it is >170 please call clinic at [phone] Prescriptions: CIPRO 500 MG TABS (CIPROFLOXACIN HCL) Take 1 tablet by mouth two times a day for 4 weeks  #60 x 0   Entered and Authorized by:   Mliss Sax MD   Signed by:   Mliss Sax MD on 01/15/2010   Method used:   Electronically to        [facility] [phone]* (retail)       [address]       [place], [place]  [address]       Ph: [phone]       Fax: [phone]   RxID:   [id number] CIPRO 500 MG TABS (CIPROFLOXACIN HCL) Take 1 tablet by mouth two times a day for 4 weeks  #60 x 0   Entered and Authorized by:   Mliss Sax MD   Signed by:   Mliss Sax MD on 01/15/2010   Method used:   Electronically to        [facility]* (retail)       [address]       [address]       [place], [place]  [address]       Ph: [phone]       Fax: [phone]   RxID:   [id number]  Medication Administration  Injection # 1:    Medication: Rocephin  250mg     Diagnosis: DYSURIA (ICD-788.1)    Route: IM    Site: RUOQ gluteus    Exp Date: 05/2012    Lot #: [id number]    Mfr: novaplus    Comments: Rocephin 500mg  IM per Dr Aldine Contes 2:30PM.    Patient tolerated injection without complications    Given by: Stanton Kidney Ditzler RN (January 15, 2010 2:30 PM)  Orders Added: 1)  T-Urinalysis [81003-65000] 2)  T-Culture, Urine [[id number]] 3)  T-Basic Metabolic Panel [[id number]] 4)  T-CBC w/Diff [[id number]] 5)  T-Chlamydia & GC Probe, Genital [87491/[id number]] 6)  T-HIV Antibody  (Reflex) [[id number]] 7)  Est. Patient Level III [44010] 8)  Admin of Therapeutic Inj  intramuscular or subcutaneous [96372] 9)  Rocephin  250mg  [J0696]   Process Orders Check Orders Results:     [facility]: ABN not required for this insurance Tests Sent for requisitioning (January 15, 2010 2:58 PM):     01/15/2010: [facility] -- T-Urinalysis [81003-65000] (signed)     01/15/2010: [facility] -- T-Culture, Urine [[id number]] (signed)     01/15/2010: [facility] -- T-Basic Metabolic Panel [id number] (signed)     01/15/2010: [facility] -- T-CBC w/Diff [[id number]] (signed)     01/15/2010: [facility] -- T-Chlamydia & GC Probe, Genital [[id number]] (signed)     01/15/2010: [facility] -- T-HIV Antibody  (Reflex) [[id number]] (signed)     Prevention & Chronic Care Immunizations   Influenza vaccine: Not documented    Tetanus booster: Not documented    Pneumococcal vaccine: Not documented  Colorectal Screening   Hemoccult: Not documented    Colonoscopy: Not documented  Other Screening   PSA: Not documented   Smoking status:  current  (01/15/2010)   Smoking cessation counseling: yes  (01/15/2010)  Lipids   Total Cholesterol: 205  (08/15/2009)   LDL: 133  (08/15/2009)   LDL Direct: Not documented   HDL: 49  (08/15/2009)   Triglycerides: 113  (08/15/2009)  Hypertension   Last Blood Pressure: 140 / 88  (01/15/2010)   Serum creatinine: 1.1  (10/23/2009)   Serum potassium 5.2  (10/23/2009)    Hypertension flowsheet reviewed?: Yes   Progress toward BP goal: Deteriorated  Self-Management Support :   Personal Goals (by the next clinic visit) :      Personal blood pressure goal: 140/90  (06/25/2009)   Patient will work on the following items until the next clinic visit to reach self-care goals:     Medications and monitoring: take my medicines every day, check my blood pressure, bring all of my medications to every visit, weigh myself weekly  (01/15/2010)     Eating: drink diet soda or water instead of juice or soda, eat more vegetables, use fresh or frozen vegetables, eat fruit for snacks and desserts  (01/15/2010)     Activity: take a 30 minute walk every day  (01/15/2010)    Hypertension self-management support: Written self-care plan, Education handout, Resources for patients handout  (01/15/2010)   Hypertension self-care plan printed.   Hypertension education handout printed      Resource handout printed.  Laboratory Results   Urine Tests  Date/Time Received: 01/15/10 1:51PM Date/Time Reported: same  Routine Urinalysis   Color: dk brown Appearance: Hazy Glucose: negative   (Normal Range: Negative) Bilirubin: moderate   (Normal Range: Negative) Ketone: small (15)   (Normal Range: Negative) Spec. Gravity: >=1.030   (  Normal Range: 1.003-1.035) Blood: moderate   (Normal Range: Negative) pH: 5.0   (Normal Range: 5.0-8.0) Protein: >=300   (Normal Range: Negative) Urobilinogen: 1.0   (Normal Range: 0-1) Leukocyte Esterace: negative   (Normal Range: Negative)         Medication  Administration  Injection # 1:    Medication: Rocephin  250mg     Diagnosis: DYSURIA (ICD-788.1)    Route: IM    Site: RUOQ gluteus    Exp Date: 05/2012    Lot #: [id number]    Mfr: novaplus    Comments: Rocephin 500mg  IM per Dr Aldine Contes 2:30PM.    Patient tolerated injection without complications    Given by: Stanton Kidney Ditzler RN (January 15, 2010 2:30 PM)  Orders Added: 1)  T-Urinalysis [81003-65000] 2)  T-Culture, Urine [[id number]] 3)  T-Basic Metabolic Panel [[id number]] 4)  T-CBC w/Diff [[id number]] 5)  T-Chlamydia & GC Probe, Genital [87491/[id number]] 6)  T-HIV Antibody  (Reflex) [[id number]] 7)  Est. Patient Level III [72536] 8)  Admin of Therapeutic Inj  intramuscular or subcutaneous [96372] 9)  Rocephin  250mg  [J0696]

## 2010-02-20 NOTE — Progress Notes (Signed)
Summary: refill/ hla  Phone Note Refill Request Message from:  Patient on March 06, 2009 10:59 AM  Refills Requested: Medication #1:  VICODIN 5-500 MG TABS take 1 pill by mouth every 6 hourly as needed for pain. spoke with dental clinic angie states hopefully within 2 wks he will be seen, so could we provide more pain med for him  Initial call taken by: Marin Roberts RN,  March 06, 2009 11:01 AM  Follow-up for Phone Call       Follow-up by: Jason Coop MD,  March 07, 2009 8:40 AM    Prescriptions: VICODIN 5-500 MG TABS (HYDROCODONE-ACETAMINOPHEN) take 1 pill by mouth every 6 hourly as needed for pain.  #30 x 0   Entered and Authorized by:   Jason Coop MD   Signed by:   Jason Coop MD on 03/07/2009   Method used:   Telephoned to ...       [facility] [address] [phone]* (retail)       [address]       [place], [place]  [address]       Ph: [phone]       Fax: [phone]   RxID:   [id number]

## 2010-02-20 NOTE — Progress Notes (Signed)
Summary: med refill/gp  #2  Phone Note Refill Request Message from:  Patient on August 12, 2009 9:29 AM  Refills Requested: Medication #1:  KLOR-CON M10 10 MEQ  TBCR take 1 pill by mouth daily.  Medication #2:  VALTREX 500 MG  TABS take 1 pill by mouth twice daily for 3 days.  Method Requested: Telephone to Pharmacy Initial call taken by: Chinita Pester RN,  August 12, 2009 9:30 AM  Follow-up for Phone Call         Potassium chloride refill faxed to pharmacy.  Patient should come in for labs (CMET) within 1 week.  Please clarify how patient is taking the Valtrex. Follow-up by: Margarito Liner MD,  August 12, 2009 3:20 PM  Additional Follow-up for Phone Call Additional follow up Details #1::        Pt.states he takes Valtrex 1 tab two times a day as needed for breakouts. Pt. has scheduled an appt. for July 28. Additional Follow-up by: Chinita Pester RN,  August 13, 2009 10:10 AM    Prescriptions: VALTREX 500 MG  TABS (VALACYCLOVIR HCL) take 1 pill by mouth twice daily for 3 days.  #6 x 0   Entered and Authorized by:   Margarito Liner MD   Signed by:   Margarito Liner MD on 08/13/2009   Method used:   Faxed to ...       [facility] (retail)       [address]       [place], [place]  [address]       Ph: [phone]       Fax: [phone]   RxID:   [id number] KLOR-CON M10 10 MEQ  TBCR (POTASSIUM CHLORIDE CRYS CR) take 1 pill by mouth daily.  #30 x 4   Entered and Authorized by:   Margarito Liner MD   Signed by:   Margarito Liner MD on 08/12/2009   Method used:   Faxed to ...       [facility] (retail)       [address]       [place], [place]  [address]       Ph: [phone]       Fax: [phone]   RxID:   [id number]  Process Orders Check Orders Results:     [facility]: ABN not required for this insurance Tests Sent for requisitioning (August 13, 2009 1:54 PM):     08/13/2009: [facility] -- T-Comprehensive Metabolic Panel [phone] (signed)

## 2010-02-20 NOTE — Assessment & Plan Note (Signed)
Summary: ACUTE-TOOTH PAIN/(POKHAREL)/CFB   Vital Signs:  Patient profile:   50 year old male Height:      67 inches (170.18 cm) Weight:      171.2 pounds (79.64 kg) BMI:     27.54 Temp:     99.4 degrees F (37.44 degrees C) oral Pulse rate:   69 / minute BP sitting:   130 / 85  (left arm) Cuff size:   regular  Vitals Entered By: Theotis Barrio NT II (June 25, 2009 9:38 AM) CC: PATIENT IS HERE FOR SEVERE TOOTH PAIN-LEFT LOWER- FILLING CAME OUT 6/6/011. HAS APPT WITH DENTAL CLINIC 6/9/011/ NEED SOMETHING FOR PAIN Is Patient Diabetic? No Nutritional Status BMI of 25 - 29 = overweight  Have you ever been in a relationship where you felt threatened, hurt or afraid?No   Does patient need assistance? Functional Status Self care Ambulation Normal Comments SEVER TOOTH PAIN X 2 PAIN / HAS DENTAL APPT 6/9/011 / WANTS SOMETHING FOR PAIN    CC:  PATIENT IS HERE FOR SEVERE TOOTH PAIN-LEFT LOWER- FILLING CAME OUT 6/6/011. HAS APPT WITH DENTAL CLINIC 6/9/011/ NEED SOMETHING FOR PAIN.  History of Present Illness: Jerry Terrell is a 50 yo man with pMH as outlined below.  He is here for tooth pain in the left lower side.  Filling came out yesterday and reports some swelling and severe pain.  He has had prior episodes in the past and reports having them had extracted.  He reports having a dental clinic appointment next week thursday 6/16.  Preventive Screening-Counseling & Management  Alcohol-Tobacco     Alcohol type: BEER AT TIMES     Smoking Status: current     Smoking Cessation Counseling: yes     Packs/Day: 1 pk per week     Year Quit: 01/06/07  Caffeine-Diet-Exercise     Does Patient Exercise: no      Drug Use:  no.    Current Medications (verified): 1)  Norvasc 10 Mg  Tabs (Amlodipine Besylate) .... Take 1 Pill By Mouth Daily. 2)  Ranitidine Hcl 300 Mg  Caps (Ranitidine Hcl) .... Take 1 Pill By Mouth Daily Before Bed 3)  Hydrochlorothiazide 25 Mg  Tabs (Hydrochlorothiazide) .... Take 1  Pill By Mouth Daily 4)  Ventolin Hfa 108 (90 Base) Mcg/act  Aers (Albuterol Sulfate) .... Take 2 Puffs Qid As Needed. 5)  Klor-Con M10 10 Meq  Tbcr (Potassium Chloride Crys Cr) .... Take 1 Pill By Mouth Daily. 6)  Valtrex 500 Mg  Tabs (Valacyclovir Hcl) .... Take 1 Pill By Mouth Twice Daily For 3 Days. 7)  Alprazolam 0.25 Mg Tabs (Alprazolam) .... Take 1 Pill By Mouth Three Times A Day As Needed For Anxiety  Allergies (verified): 1)  ! Benadryl  Past History:  Past Medical History: HTN multiple dental caries with extractions  Past Surgical History: inguinal hernia repair ? 1977 or so  Social History: Occupation:  Materials engineer Current Smoker:  5 cig/day Alcohol use-yes:  1 beer/day Drug use-no Smoking Status:  current Drug Use:  no  Review of Systems      See HPI  Physical Exam  General:  markedly anxious, in pain and uncomfortable Eyes:  anciteric Mouth:  poor dentition.  there is a filling tooth ? 18 with minimal swelling in gum. Lungs:  normal respiratory effort and no accessory muscle use.   Neurologic:  alert & oriented X3 and gait normal.   Psych:  Oriented X3 and moderately anxious.  Impression & Recommendations:  Problem # 1:  DENTAL CARIES (ICD-521.00)  similar episodes in past will check UDS and provide limited amount of vicodin reports having appointmnet with dental clinic 1 week from thursday (6/16)... will confirm. will avoid antibiotics at this point.  Orders: T-Drug Screen-Urine, (single) [id number])  Problem # 2:  ANXIETY (ICD-300.00) markedly anxious, likely due to pain above will check UDS to make sure no other source  His updated medication list for this problem includes:    Alprazolam 0.25 Mg Tabs (Alprazolam) .Marland Kitchen... Take 1 pill by mouth three times a day as needed for anxiety  Orders: T-Drug Screen-Urine, (single) [phone])  Complete Medication List: 1)  Norvasc 10 Mg Tabs (Amlodipine besylate) .... Take 1 pill by mouth  daily. 2)  Ranitidine Hcl 300 Mg Caps (Ranitidine hcl) .... Take 1 pill by mouth daily before bed 3)  Hydrochlorothiazide 25 Mg Tabs (Hydrochlorothiazide) .... Take 1 pill by mouth daily 4)  Ventolin Hfa 108 (90 Base) Mcg/act Aers (Albuterol sulfate) .... Take 2 puffs qid as needed. 5)  Klor-con M10 10 Meq Tbcr (Potassium chloride crys cr) .... Take 1 pill by mouth daily. 6)  Valtrex 500 Mg Tabs (Valacyclovir hcl) .... Take 1 pill by mouth twice daily for 3 days. 7)  Alprazolam 0.25 Mg Tabs (Alprazolam) .... Take 1 pill by mouth three times a day as needed for anxiety 8)  Vicodin 5-500 Mg Tabs (Hydrocodone-acetaminophen) .... Take 1 tab every 6 hours as needed pain  Patient Instructions: 1)  Please schedule an appointment with your primary doctor in : first available. 2)  Will provide a limited amount of vicodin until seen in the dental clinic. 3)  continue medications below. 4)  if you have any further problems before next visit, call clinic.  Prescriptions: VICODIN 5-500 MG TABS (HYDROCODONE-ACETAMINOPHEN) take 1 tab every 6 hours as needed pain  #40 x 0   Entered and Authorized by:   Mariea Stable MD   Signed by:   Mariea Stable MD on 06/25/2009   Method used:   Print then Give to Patient   RxID:   [id number]  Process Orders Check Orders Results:     [facility]: ABN not required for this insurance Order queued for requisitioning for [facility]: June 25, 2009 10:14 AM  Tests Sent for requisitioning (June 25, 2009 10:14 AM):     06/25/2009: [facility] -- T-Drug Screen-Urine, (single) [phone] (signed)    Prevention & Chronic Care Immunizations   Influenza vaccine: Not documented    Tetanus booster: Not documented    Pneumococcal vaccine: Not documented  Colorectal Screening   Hemoccult: Not documented    Colonoscopy: Not documented  Other Screening   PSA: Not documented   Smoking status: current  (06/25/2009)   Smoking  cessation counseling: yes  (06/25/2009)  Lipids   Total Cholesterol: 197  (06/06/2008)   LDL: 132  (06/06/2008)   LDL Direct: Not documented   HDL: 46  (06/06/2008)   Triglycerides: 97  (06/06/2008)  Hypertension   Last Blood Pressure: 130 / 85  (06/25/2009)   Serum creatinine: 1.19  (06/25/2008)   Serum potassium 3.9  (06/25/2008)  Self-Management Support :   Personal Goals (by the next clinic visit) :      Personal blood pressure goal: 140/90  (06/25/2009)   Patient will work on the following items until the next clinic visit to reach self-care goals:     Medications and monitoring: take my medicines every  day, bring all of my medications to every visit  (06/25/2009)     Eating: drink diet soda or water instead of juice or soda, eat more vegetables, use fresh or frozen vegetables, eat foods that are low in salt, eat baked foods instead of fried foods, eat fruit for snacks and desserts, limit or avoid alcohol  (06/25/2009)     Activity: take a 30 minute walk every day  (06/25/2009)    Hypertension self-management support: Resources for patients handout  (06/25/2009)      Resource handout printed.   Appended Document: ACUTE-TOOTH PAIN/(POKHAREL)/CFB    Clinical Lists Changes  Orders: Added new Test order of T- * Misc. Laboratory test [phone]) - Signed       Problem # 13:  DENTAL CARIES (ICD-521.00)  Orders: T- * Misc. Laboratory test [phone])   Complete Medication List: 1)  Norvasc 10 Mg Tabs (Amlodipine besylate) .... Take 1 pill by mouth daily. 2)  Ranitidine Hcl 300 Mg Caps (Ranitidine hcl) .... Take 1 pill by mouth daily before bed 3)  Hydrochlorothiazide 25 Mg Tabs (Hydrochlorothiazide) .... Take 1 pill by mouth daily 4)  Ventolin Hfa 108 (90 Base) Mcg/act Aers (Albuterol sulfate) .... Take 2 puffs qid as needed. 5)  Klor-con M10 10 Meq Tbcr (Potassium chloride crys cr) .... Take 1 pill by mouth daily. 6)  Valtrex 500 Mg Tabs (Valacyclovir hcl) .... Take 1  pill by mouth twice daily for 3 days. 7)  Alprazolam 0.25 Mg Tabs (Alprazolam) .... Take 1 pill by mouth three times a day as needed for anxiety 8)  Vicodin 5-500 Mg Tabs (Hydrocodone-acetaminophen) .... Take 1 tab every 6 hours as needed pain   Process Orders Check Orders Results:     [facility]: ABN not required for this insurance Order queued for requisitioning for [facility]: June 25, 2009 10:37 AM  Tests Sent for requisitioning (June 25, 2009 10:37 AM):     06/25/2009: [facility] -- T- * Misc. Laboratory test [phone] (signed)

## 2010-02-20 NOTE — Assessment & Plan Note (Signed)
Summary: Smoking Cessation  Social Work.  45 minutes.  Smoking Cessation counseling.   Met with Jerry Terrell in my office.  He smokes a pack a day and has been smoking since age 50.  His main reason for quitting is because he wants to set a good example for his grandchildren.   Quit tips were shared with Jerry Terrell and since he didn't think he could quit on his own, NRT was encouraged along with coming up with substitutes for his smoking like gum, carrots and celery stix, suckers, straws or toothpicks.   Jerry Terrell lives with his fiancee who is a nonsmoker,  He sometimes smokes in the house.  He has been encouraged to make both his home and car smoke free before his quit date.   HIs tentative quitdate is Monday August 1st.  He will come in the afternoon to visit with me again. I have suggested he purchase the patches starting with 21 mg for the first 6 weeks.   He has tip sheet and how to cope with triggers.  He was encouraged to connect with the Quitline for additional support.   SW followup.

## 2010-02-20 NOTE — Progress Notes (Signed)
Summary: med refill/gp  Phone Note Refill Request Message from:  Patient on August 12, 2009 9:32 AM  Refills Requested: Medication #1:  ALPRAZOLAM 0.25 MG TABS take 1 pill by mouth three times a day as needed for anxiety. This med. has to be refilled at [facility].   Method Requested: Telephone to Pharmacy Initial call taken by: Chinita Pester RN,  August 12, 2009 9:32 AM  Follow-up for Phone Call        This was prescribed in early May (#50 with no refills).  Results of UDS on 06/25/2009 noted.  Patient will need to be seen for management of his anxiety; please schedule an appointment this week.  Follow-up by: Margarito Liner MD,  August 12, 2009 3:29 PM  Additional Follow-up for Phone Call Additional follow up Details #1::        Pt. was calledto make an appt. Additional Follow-up by: Chinita Pester RN,  August 13, 2009 10:06 AM    Additional Follow-up for Phone Call Additional follow up Details #2::    Thank you.  Per Dr. Meredith Pel' note, will wait for pt to be seen for eval and further management. Follow-up by: Mariea Stable MD,  August 13, 2009 10:09 AM

## 2010-02-20 NOTE — Assessment & Plan Note (Signed)
Summary: bp/401.01/sa  Nurse Visit   Vital Signs:  Patient profile:   50 year old male Weight:      177 pounds Pulse rate:   80 / minute Pulse rhythm:   regular BP sitting:   120 / 80  (left arm) Cuff size:   regular  Allergies: 1)  ! Benadryl  Impression & Recommendations:  Problem # 1:  HYPERTENSION (ICD-401.9)  Pt with complaints of dry mouth and allergies this morning.  Jerry Terrell states that he has not started taking Lisinopril as previously ordered. He has not taken any of his bp medications today.  Dr. Clifton James aware and Lisinopril was discontinued.  Encouraged patient to regularly exercise 3xwk, follow a low salt low fat heart healthy diet.  He will follow-up with his PCP for hypertension and recommendations for a GI MD as he has had ulcers in the past and does experience some bloating after meals. Jerry Devoid RN  October 23, 2009 9:45 AM   Prevention & Chronic Care Immunizations   Influenza vaccine: Not documented    Tetanus booster: Not documented    Pneumococcal vaccine: Not documented  Colorectal Screening   Hemoccult: Not documented    Colonoscopy: Not documented  Other Screening   PSA: Not documented   Smoking status: current  (08/15/2009)   Smoking cessation counseling: yes  (08/15/2009)  Lipids   Total Cholesterol: 205  (08/15/2009)   LDL: 133  (08/15/2009)   LDL Direct: Not documented   HDL: 49  (08/15/2009)   Triglycerides: 113  (08/15/2009)  Hypertension   Last Blood Pressure: 120 / 80  (10/23/2009)   Serum creatinine: 1.22  (08/15/2009)   Serum potassium 4.3  (08/15/2009)  Self-Management Support :   Personal Goals (by the next clinic visit) :      Personal blood pressure goal: 140/90  (06/25/2009)   Hypertension self-management support: Education handout, Written self-care plan, Resources for patients handout  (08/15/2009)  Appended Document: bp/401.01/sa    Clinical Lists Changes  Medications: Removed medication of  LISINOPRIL 5 MG TABS (LISINOPRIL) Take one tablet by mouth daily

## 2010-02-20 NOTE — Assessment & Plan Note (Signed)
Summary: np6/chest pain/jml   Visit Type:  new pt visit Primary Ovide Dusek:  Rosana Berger MD  CC:  chest pain...bilateral leg pain....pt states his BP is up and down.....  History of Present Illness: 50 yo AAM with history of tobacco abuse, HTN and GERD who is here today for evaluation of chest pain. He tells me that he has occasional sharp pains over the last few months. This happens once per week. This is associated with dizziness. No exertional chest pain. He does notice pain in both calf muscles when he walks. This does not occur consistently. No rest pain or ulcerations.   Current Medications (verified): 1)  Norvasc 10 Mg  Tabs (Amlodipine Besylate) .... Take 1 Pill By Mouth Daily. 2)  Ranitidine Hcl 300 Mg  Caps (Ranitidine Hcl) .... Take 1 Pill By Mouth Daily Before Bed 3)  Hydrochlorothiazide 25 Mg  Tabs (Hydrochlorothiazide) .... Take 1 Pill By Mouth Daily 4)  Ventolin Hfa 108 (90 Base) Mcg/act  Aers (Albuterol Sulfate) .... Take 2 Puffs Qid As Needed. 5)  Klor-Con M10 10 Meq  Tbcr (Potassium Chloride Crys Cr) .... Take 1 Pill By Mouth Daily. 6)  Valtrex 500 Mg  Tabs (Valacyclovir Hcl) .... Take 1 Pill By Mouth Twice Daily For 3 Days.  Allergies: 1)  ! Benadryl  Past History:  Past Medical History: HTN Multiple dental caries with extractions Asthma GERD Bronchitis Kidney stones Migraine Headaches  Past Surgical History: Iinguinal hernia repair ? 1977  Cardiac cath December 2008 (Normal coronary arteries)  Family History: Family History of Coronary Artery Disease:  Family History of CVA or Stroke:  Family History of Hypertension:   Mother-CVA/MI Father-MI Sister-MI  Social History: Occupation:  Unemployed Current Smoker:  1ppd for 25 years. Alcohol use-yes:  1 beer/day Drug use-no Single 2 chiildren, healthy  Review of Systems       See HPI. Bilateral leg pain with ambulation.  Vital Signs:  Patient profile:   50 year old male Height:      67  inches Weight:      171 pounds BMI:     26.88 Pulse rate:   74 / minute Pulse rhythm:   regular BP sitting:   110 / 70  (left arm) Cuff size:   large  Vitals Entered By: Danielle Rankin, CMA (September 10, 2009 4:05 PM)  Physical Exam  General:  General: Well developed, well nourished, NAD HEENT: OP clear, mucus membranes moist SKIN: warm, dry Neuro: No focal deficits Musculoskeletal: Muscle strength 5/5 all ext Psychiatric: Mood and affect normal Neck: No JVD, no carotid bruits, no thyromegaly, no lymphadenopathy. Lungs:Clear bilaterally, no wheezes, rhonci, crackles CV: RRR no murmurs, gallops rubs Abdomen: soft, NT, ND, BS present Extremities: No edema, pulses 2+ in bilateral DP/PT/radial.    Cardiac Cath  Procedure date:  12/27/2006  Findings:      CORONARY ANATOMY:  The left main coronary artery was normal.      Left anterior descending coronary artery:  The left anterior descending   coronary artery was also normal.      Left circumflex coronary artery:  The left circumflex coronary artery   was normal.      Right coronary artery:  The right coronary was dominant and his   posterolateral branch and posterior descending coronary arteries were   also normal.      LEFT VENTRICULOGRAM:  The left ventriculogram showed mild apical   hypokinesia with ejection fraction of 60%.      IMPRESSION:  1. Normal coronaries.   2. Preserved left ventricular systolic function.  EKG  Procedure date:  12/27/2006  Findings:      Normal sinus rhythm, rate 74 bpm. Normal EKG  Impression & Recommendations:  Problem # 1:  CHEST PAIN, ATYPICAL (ICD-786.59)  His pain is atypical. He had a normal cardiac cath in December of 2008. I think there is a low probability of obstructive CAD. I will arrange an exercise treadmill stress.   His updated medication list for this problem includes:    Norvasc 10 Mg Tabs (Amlodipine besylate) .Marland Kitchen... Take 1 pill by mouth daily.  His updated  medication list for this problem includes:    Norvasc 10 Mg Tabs (Amlodipine besylate) .Marland Kitchen... Take 1 pill by mouth daily.  Problem # 2:  LIMB PAIN (ICD-729.5) His pain is in the bilateral calf muscles with exercise. His pulses are palpable and handheld doppler has audible signals in bilateral DP and PT. I will arrange ABI to assess further.   Other Orders: Treadmill (Treadmill) Arterial Duplex Lower Extremity (Arterial Duplex Low)  Patient Instructions: 1)  Your physician recommends that you schedule a follow-up appointment in: Dr. Clifton James will see you when you come in for your stress test. 2)  Your physician recommends that you continue on your current medications as directed. Please refer to the Current Medication list given to you today. 3)  Your physician has requested that you have an ankle brachial index (ABI). During this test an ultrasound and blood pressure cuff are used to evaluate the arteries that supply the arms and legs with blood. Allow thirty minutes for this exam. There are no restrictions or special instructions. 4)  Your physician has requested that you have an exercise tolerance test.  For further information please visit [email or web address]  Please also follow instruction sheet, as given.

## 2010-02-20 NOTE — Assessment & Plan Note (Signed)
Summary: EST-CK/FU/MEDS/CFB   Vital Signs:  Patient profile:   50 year old male Height:      67 inches (170.18 cm) Weight:      175.2 pounds (79.64 kg) BMI:     27.54 Temp:     98.9 degrees F (37.17 degrees C) oral Pulse rate:   85 / minute BP sitting:   126 / 85  (right arm)  Vitals Entered By: Stanton Kidney Ditzler RN (February 28, 2009 10:46 AM) Is Patient Diabetic? No Pain Assessment Patient in pain? yes     Location: teeth Intensity: 9-10 Type: painful Onset of pain  past 3-4 weeks Nutritional Status BMI of 25 - 29 = overweight Nutritional Status Detail appetite down  Have you ever been in a relationship where you felt threatened, hurt or afraid?denies   Does patient need assistance? Functional Status Self care Ambulation Normal Comments ER FU - needs dental referral. Past 1.5 months swollen stomach and tender in chest. Tylenol 500mg  by mouth x1 by mouth given to pt at 12N per Dr Aleene Davidson. Another sample given to pt per Dr Aleene Davidson Tylenol 500mg  by mouth x1 - to repeat 4-6 hours for pain.   History of Present Illness: Jerry Terrell is a 50 yo man comes today for f/u.  1. HTN: He is taking his norvasc and hctz regularly.   2. Anxiety: He takes clonapin as needed.   3. Abdominal Pain; Sometimes he feels like his belly is bloating and swollen. No abdoominal pain. He had abdominal xray and ultrasound a year ago and was WNL. No blood in stool, no vomiting, but he has some nausea.   4. Dental Pain: Pt visited ED few days back for dental pain. He wasn't given any antibiotics. He has some discharge. He endorses some fever and chills. He had temp of 101, about 2-3 months ago when he also had this dental pain. Then he was on antibiotics. He also has some headache.  Depression History:      The patient denies a depressed mood most of the day and a diminished interest in his usual daily activities.         Preventive Screening-Counseling & Management  Alcohol-Tobacco     Alcohol  type: BEER AT TIMES     Smoking Status: current yesterday     Smoking Cessation Counseling: yes     Packs/Day: 1 pk per week     Year Quit: 01/06/07  Caffeine-Diet-Exercise     Does Patient Exercise: no  Current Medications (verified): 1)  Norvasc 10 Mg  Tabs (Amlodipine Besylate) .... Take 1 Pill By Mouth Daily. 2)  Ranitidine Hcl 300 Mg  Caps (Ranitidine Hcl) .... Take 1 Pill By Mouth Daily Before Bed 3)  Hydrochlorothiazide 25 Mg  Tabs (Hydrochlorothiazide) .... Take 1 Pill By Mouth Daily 4)  Ventolin Hfa 108 (90 Base) Mcg/act  Aers (Albuterol Sulfate) .... Take 2 Puffs Qid As Needed. 5)  Klor-Con M10 10 Meq  Tbcr (Potassium Chloride Crys Cr) .... Take 1 Pill By Mouth Daily. 6)  Valtrex 500 Mg  Tabs (Valacyclovir Hcl) .... Take 1 Pill By Mouth Twice Daily For 3 Days. 7)  Clonazepam 0.5 Mg Tabs (Clonazepam) .... Take One Half Pill Two Times A Day 8)  Vicodin 5-500 Mg Tabs (Hydrocodone-Acetaminophen) .... Take 1 Pill By Mouth Every 6 Hourly As Needed For Pain. 9)  Amoxicillin 500 Mg Caps (Amoxicillin) .... Take 1 Pill By Mouth Two Times A Day For 5 Days  Allergies: 1)  !  Benadryl  Social History: Packs/Day:  1 pk per week  Review of Systems      See HPI  Physical Exam  General:  alert.   Mouth:  pharynx pink and moist, poor dentition, halitosis, gingival inflammation, and white plaque(s).  pharynx pink and moist, poor dentition, halitosis, gingival inflammation, and white plaque(s).   Lungs:  normal breath sounds, no crackles, and no wheezes.   Heart:  normal rate, regular rhythm, and no murmur.   Abdomen:  soft, non-tender, and normal bowel sounds.   Extremities:  trace left pedal edema and trace right pedal edema.   Neurologic:  alert & oriented X3.   Cervical Nodes:  no anterior cervical adenopathy and no posterior cervical adenopathy.  no anterior cervical adenopathy and no posterior cervical adenopathy.     Impression & Recommendations:  Problem # 1:  DENTAL CARIES  (ICD-521.00) The pt presented with excruciating dental pain and has visited ED twice and was on antibiotics during his first ED visit. He is today crying in my office with pain. I gave him extra strength tylenol. I also prescribed amoxycillin and vicodin for pain. Will make an urgent dental referral.  Orders: Dental Referral (Dentist)  Problem # 2:  ANXIETY (ICD-300.00) Will cont. his clonapin.  His updated medication list for this problem includes:    Clonazepam 0.5 Mg Tabs (Clonazepam) .Marland Kitchen... Take one half pill two times a day  Problem # 3:  GERD (ICD-530.81)  Cont his zantac. His updated medication list for this problem includes:    Ranitidine Hcl 300 Mg Caps (Ranitidine hcl) .Marland Kitchen... Take 1 pill by mouth daily before bed  Labs Reviewed: Hgb: 16.2 (06/06/2008)   Hct: 46.9 (06/06/2008)  His updated medication list for this problem includes:    Ranitidine Hcl 300 Mg Caps (Ranitidine hcl) .Marland Kitchen... Take 1 pill by mouth daily before bed  Problem # 4:  HYPERTENSION (ICD-401.9) BP well controlled with following regimen.  His updated medication list for this problem includes:    Norvasc 10 Mg Tabs (Amlodipine besylate) .Marland Kitchen... Take 1 pill by mouth daily.    Hydrochlorothiazide 25 Mg Tabs (Hydrochlorothiazide) .Marland Kitchen... Take 1 pill by mouth daily  BP today: 126/85 Prior BP: 128/91 (06/19/2008)  Labs Reviewed: K+: 3.9 (06/25/2008) Creat: : 1.19 (06/25/2008)   Chol: 197 (06/06/2008)   HDL: 46 (06/06/2008)   LDL: 132 (06/06/2008)   TG: 97 (06/06/2008)  His updated medication list for this problem includes:    Norvasc 10 Mg Tabs (Amlodipine besylate) .Marland Kitchen... Take 1 pill by mouth daily.    Hydrochlorothiazide 25 Mg Tabs (Hydrochlorothiazide) .Marland Kitchen... Take 1 pill by mouth daily  Complete Medication List: 1)  Norvasc 10 Mg Tabs (Amlodipine besylate) .... Take 1 pill by mouth daily. 2)  Ranitidine Hcl 300 Mg Caps (Ranitidine hcl) .... Take 1 pill by mouth daily before bed 3)  Hydrochlorothiazide 25 Mg Tabs  (Hydrochlorothiazide) .... Take 1 pill by mouth daily 4)  Ventolin Hfa 108 (90 Base) Mcg/act Aers (Albuterol sulfate) .... Take 2 puffs qid as needed. 5)  Klor-con M10 10 Meq Tbcr (Potassium chloride crys cr) .... Take 1 pill by mouth daily. 6)  Valtrex 500 Mg Tabs (Valacyclovir hcl) .... Take 1 pill by mouth twice daily for 3 days. 7)  Clonazepam 0.5 Mg Tabs (Clonazepam) .... Take one half pill two times a day 8)  Vicodin 5-500 Mg Tabs (Hydrocodone-acetaminophen) .... Take 1 pill by mouth every 6 hourly as needed for pain. 9)  Amoxicillin 500 Mg Caps (  Amoxicillin) .... Take 1 pill by mouth two times a day for 5 days  Patient Instructions: 1)  Please schedule a follow-up appointment in 1 month. 2)  Limit your Sodium (Salt) to less than 2 grams a day(slightly less than 1/2 a teaspoon) to prevent fluid retention, swelling, or worsening of symptoms. 3)  Tobacco is very bad for your health and your loved ones! You Should stop smoking!. 4)  Stop Smoking Tips: Choose a Quit date. Cut down before the Quit date. decide what you will do as a substitute when you feel the urge to smoke(gum,toothpick,exercise). 5)  It is important that you exercise regularly at least 20 minutes 5 times a week. If you develop chest pain, have severe difficulty breathing, or feel very tired , stop exercising immediately and seek medical attention. 6)  You need to lose weight. Consider a lower calorie diet and regular exercise.  7)  Check your Blood Pressure regularly. If it is above: you should make an appointment. Prescriptions: AMOXICILLIN 500 MG CAPS (AMOXICILLIN) take 1 pill by mouth two times a day for 5 days  #10 x 0   Entered and Authorized by:   Jerry Terrell   Signed by:   Jerry Terrell on 02/28/2009   Method used:   Print then Give to Patient   RxID:   [id number] VICODIN 5-500 MG TABS (HYDROCODONE-ACETAMINOPHEN) take 1 pill by mouth every 6 hourly as needed for pain.  #30 x 0   Entered and  Authorized by:   Jerry Terrell   Signed by:   Jerry Terrell on 02/28/2009   Method used:   Print then Give to Patient   RxID:   [id number] CLONAZEPAM 0.5 MG TABS (CLONAZEPAM) take one half pill two times a day  #30 x 0   Entered and Authorized by:   Jerry Terrell   Signed by:   Jerry Terrell on 02/28/2009   Method used:   Print then Give to Patient   RxID:   [id number] VALTREX 500 MG  TABS (VALACYCLOVIR HCL) take 1 pill by mouth twice daily for 3 days.  #6 x 4   Entered and Authorized by:   Jerry Terrell   Signed by:   Jerry Terrell on 02/28/2009   Method used:   Print then Give to Patient   RxID:   [id number] KLOR-CON M10 10 MEQ  TBCR (POTASSIUM CHLORIDE CRYS CR) take 1 pill by mouth daily.  #30 x 4   Entered and Authorized by:   Jerry Terrell   Signed by:   Jerry Terrell on 02/28/2009   Method used:   Print then Give to Patient   RxID:   [id number] VENTOLIN HFA 108 (90 BASE) MCG/ACT  AERS (ALBUTEROL SULFATE) take 2 puffs qid as needed.  #1 mo supply x 4   Entered and Authorized by:   Jerry Terrell   Signed by:   Jerry Terrell on 02/28/2009   Method used:   Print then Give to Patient   RxID:   [id number] HYDROCHLOROTHIAZIDE 25 MG  TABS (HYDROCHLOROTHIAZIDE) take 1 pill by mouth daily  #30 x 4   Entered and Authorized by:   Jerry Terrell   Signed by:   Jerry Terrell on 02/28/2009   Method used:   Print then Give to Patient   RxID:   [id number] RANITIDINE HCL 300 MG  CAPS (RANITIDINE HCL) take 1 pill by mouth daily before bed  #  30 x 4   Entered and Authorized by:   Jerry Terrell   Signed by:   Jerry Terrell on 02/28/2009   Method used:   Print then Give to Patient   RxID:   [id number] NORVASC 10 MG  TABS (AMLODIPINE BESYLATE) take 1 pill by mouth daily.  #30 x 4   Entered and Authorized by:   Jerry Terrell   Signed by:    Jerry Terrell on 02/28/2009   Method used:   Print then Give to Patient   RxID:   [id number]    Prevention & Chronic Care Immunizations   Influenza vaccine: Not documented    Tetanus booster: Not documented    Pneumococcal vaccine: Not documented  Colorectal Screening   Hemoccult: Not documented    Colonoscopy: Not documented  Other Screening   PSA: Not documented   Smoking status: current yesterday  (02/28/2009)  Lipids   Total Cholesterol: 197  (06/06/2008)   LDL: 132  (06/06/2008)   LDL Direct: Not documented   HDL: 46  (06/06/2008)   Triglycerides: 97  (06/06/2008)  Hypertension   Last Blood Pressure: 126 / 85  (02/28/2009)   Serum creatinine: 1.19  (06/25/2008)   Serum potassium 3.9  (06/25/2008)  Self-Management Support :    Patient will work on the following items until the next clinic visit to reach self-care goals:     Medications and monitoring: take my medicines every day, check my blood pressure, bring all of my medications to every visit, weigh myself weekly  (02/28/2009)     Eating: drink diet soda or water instead of juice or soda, eat more vegetables, use fresh or frozen vegetables, eat fruit for snacks and desserts  (02/28/2009)     Activity: take a 30 minute walk every day  (02/28/2009)    Hypertension self-management support: Not documented

## 2010-02-20 NOTE — Progress Notes (Signed)
Summary: med refill/gp  #1  Phone Note Refill Request Message from:  Patient on August 12, 2009 9:27 AM  Refills Requested: Medication #1:  NORVASC 10 MG  TABS take 1 pill by mouth daily.  Medication #2:  RANITIDINE HCL 300 MG  CAPS take 1 pill by mouth daily before bed  Medication #3:  HYDROCHLOROTHIAZIDE 25 MG  TABS take 1 pill by mouth daily  Medication #4:  VENTOLIN HFA 108 (90 BASE) MCG/ACT  AERS take 2 puffs qid as needed. Last appts, June 7 and Feb 10.   Method Requested: Telephone to Pharmacy Initial call taken by: Chinita Pester RN,  August 12, 2009 9:27 AM  Follow-up for Phone Call        Rx faxed to pharmacy. Follow-up by: Margarito Liner MD,  August 12, 2009 3:16 PM    Prescriptions: VENTOLIN HFA 108 (90 BASE) MCG/ACT  AERS (ALBUTEROL SULFATE) take 2 puffs qid as needed.  #1 mo supply x 4   Entered and Authorized by:   Margarito Liner MD   Signed by:   Margarito Liner MD on 08/12/2009   Method used:   Faxed to ...       [facility] (retail)       [address]       [place], [place]  [address]       Ph: [phone]       Fax: [phone]   RxID:   [id number] HYDROCHLOROTHIAZIDE 25 MG  TABS (HYDROCHLOROTHIAZIDE) take 1 pill by mouth daily  #30 x 4   Entered and Authorized by:   Margarito Liner MD   Signed by:   Margarito Liner MD on 08/12/2009   Method used:   Faxed to ...       [facility] (retail)       [address]       [place], [place]  [address]       Ph: [phone]       Fax: [phone]   RxID:   [id number] RANITIDINE HCL 300 MG  CAPS (RANITIDINE HCL) take 1 pill by mouth daily before bed  #30 x 4   Entered and Authorized by:   Margarito Liner MD   Signed by:   Margarito Liner MD on 08/12/2009   Method used:   Faxed to ...       [facility] (retail)       [address]       [place], [place]  [address]       Ph: [phone]       Fax:  [phone]   RxID:   [id number] NORVASC 10 MG  TABS (AMLODIPINE BESYLATE) take 1 pill by mouth daily.  #30 x 4   Entered and Authorized by:   Margarito Liner MD   Signed by:   Margarito Liner MD on 08/12/2009   Method used:   Faxed to ...       [facility] (retail)       [address]       [place], [place]  [address]       Ph: [phone]       Fax: [phone]   RxID:   [id number]

## 2010-02-20 NOTE — Progress Notes (Signed)
Summary: refill/gg  Phone Note Refill Request  on October 17, 2009 2:03 PM  Refills Requested: Medication #1:  HYDROCHLOROTHIAZIDE 25 MG  TABS take 1 pill by mouth daily   Last Refilled: 08/04/2009 last visit 08/15/09   Method Requested: Electronic Initial call taken by: Merrie Roof RN,  October 17, 2009 2:04 PM    Prescriptions: HYDROCHLOROTHIAZIDE 25 MG  TABS (HYDROCHLOROTHIAZIDE) take 1 pill by mouth daily  #30 x 11   Entered and Authorized by:   Zoila Shutter MD   Signed by:   Zoila Shutter MD on 10/17/2009   Method used:   Electronically to        [facility] [phone]* (retail)       [address]       [place], [place]  [address]       Ph: [phone]       Fax: [phone]   RxID:   [id number]

## 2010-02-20 NOTE — Progress Notes (Signed)
Summary: phone/gg  Phone Note From Other Clinic   Summary of Call: call from Thayer Ohm, dental assisstant at [facility].  [phone])   They have had some  Issues with pt. and wanted [place] to be aware. They will not give him any more pain meds from the dental clinic because he has received several Rx in a short amount of time.  He tells them that he goes to the ED and gets more pain meds   there.    He has one more extraction that should be done on Monday.  Can you run his name through the data base?  Initial call taken by: Merrie Roof RN,  April 11, 2009 11:18 AM  Follow-up for Phone Call        Ran name through [facility] database.  Except #15 hydrocodone in May 2010, pt only started receiving narcs after his teeth became an issue.  In the past 2 months,he has gotten #128 hydrocodone,averaging 2 per day.  Last narc fill was 03/22/09.  Checked echart and had ER visit 04/06/09 but I can't access those records.  Pt has legitimate reason for pain,is actively seeking definitve treatment for the pain (last extraction on Monday), and isn't really doctor shopping.  If it were up to me, I would give narcs to get through this last extraction.  Dentist has said they plan not to give any more narcs.  Follow-up by: Blanch Media MD,  April 11, 2009 11:45 AM     Appended Document: phone/gg I agree with Dr. Rogelia Boga.

## 2010-02-20 NOTE — Progress Notes (Signed)
Summary: phone note/dentgal/gp  Phone Note Call from Patient   Caller: Patient Summary of Call: Pt. states tooth filling came out and he's in a lot of pain and wants to see the doctor for something for pain. He has been to the Dental Clinic and I instructed him to call them.  He said it will probably be a "long time" before he can get an appt. Initial call taken by: Chinita Pester RN,  June 24, 2009 2:45 PM  Follow-up for Phone Call        Appt. has been scheduled for tomorrow.  Also, I called the pt. back to make sure he has the Dental Clinic # so he can go ahead and schedule his appt. [phone] or [phone]) Follow-up by: Chinita Pester RN,  June 24, 2009 2:51 PM

## 2010-02-20 NOTE — Assessment & Plan Note (Signed)
Summary: ACUTE/HO/1 MONTH RECHECK PER [facility]   Vital Signs:  Patient profile:   50 year old male Weight:      177.5 pounds Temp:     99.1 degrees F Pulse rate:   78 / minute BP sitting:   138 / 90  Primary Care Provider:  Rosana Berger MD   History of Present Illness: 50 y/o m with h/o HTN and recent empiric treatment for prostatis comes to the clinic complaining of headache.   - since 3 weeks - frontal mostly - intermittent.  - 8/10 in intesitiy, dull aching pain - he thinks it is from ciprofloxacin, - says it started after he started ciprofloxacin - he stopped ciprofloxacin 1 week ago but has been having headache.   b/l flank pain since 1 week -sharp - constant pain - worse after he stopped ciprofloxacin - no urinary complaints, no fever, chills,     ;  Current Medications (verified): 1)  Norvasc 10 Mg  Tabs (Amlodipine Besylate) .... Take 1 Pill By Mouth Daily. 2)  Ranitidine Hcl 300 Mg  Caps (Ranitidine Hcl) .... Take 1 Pill By Mouth Daily Before Bed 3)  Hydrochlorothiazide 25 Mg  Tabs (Hydrochlorothiazide) .... Take 1 Pill By Mouth Daily 4)  Ventolin Hfa 108 (90 Base) Mcg/act  Aers (Albuterol Sulfate) .... Take 2 Puffs Qid As Needed. 5)  Klor-Con M10 10 Meq  Tbcr (Potassium Chloride Crys Cr) .... Take 1 Pill By Mouth Daily. 6)  Cialis 10 Mg Tabs (Tadalafil) .... Please Take One Pill By Mouth 30 Minutes Before Sexual Activity.  Allergies (verified): 1)  ! Benadryl  Review of Systems       The patient complains of abdominal pain and severe indigestion/heartburn.  The patient denies anorexia, fever, weight loss, weight gain, vision loss, decreased hearing, hoarseness, chest pain, syncope, dyspnea on exertion, peripheral edema, prolonged cough, headaches, hemoptysis, melena, hematochezia, hematuria, incontinence, genital sores, muscle weakness, suspicious skin lesions, transient blindness, difficulty walking, depression, unusual weight change, abnormal bleeding,  enlarged lymph nodes, angioedema, breast masses, and testicular masses.    Physical Exam  General:  alert and well-developed.   Head:  normocephalic and atraumatic.   Neck:  supple and full ROM.   Lungs:  normal respiratory effort, no intercostal retractions, no accessory muscle use, normal breath sounds, and no dullness.   Heart:  normal rate, regular rhythm, no murmur, and no gallop.   Abdomen:  soft, LUQ tenderness, R flank tenderness, and L flank tenderness.   Rectal:  no external abnormalities and no hemorrhoids.  tenderness on exam- could not assess if its prostatic tenderness as he would not let me examine thorougly because of pain  Genitalia:  no abnormality   Impression & Recommendations:  Problem # 1:  UNSPECIFIED PROSTATITIS (ICD-601.9) He continues to have symptoms. He was feeling better for 3 weeks while he was on cipro but he stopped it before completing course because of GI side effects. He has urinary complaints and flank pain but his urine dipstick does not show pyurea altough it shows moderate blood and protein. Also he had multiple CT scan of abd/pelvis and [place] Abd for abdominal pain so I am not getting one today. He says he has been to [facility]rology few years back and they gave him some tablets which treated his pain. I will try to get those records from them and refer him to [facility]. 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 Will ask him to take tylenol and ibuprofen(if tolerated) for pain. Will ask him to return in case he cannot tolerate by mouth anitiotics or has new symptoms. 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 Take 1 pill by mouth daily. 2)  Ranitidine Hcl 300 Mg Caps (Ranitidine hcl) .... Take 1 pill by mouth daily before bed 3)  Hydrochlorothiazide 25 Mg Tabs (Hydrochlorothiazide) .... Take 1 pill by mouth daily 4)  Ventolin Hfa 108 (90 Base) Mcg/act Aers (Albuterol sulfate) .... Take 2 puffs qid as needed. 5)  Klor-con M10 10 Meq Tbcr (Potassium chloride crys cr) .... Take 1 pill by mouth daily. 6)  Cialis 10 Mg Tabs (Tadalafil) .... Please take one pill by mouth 30 minutes before sexual activity. 7)  Bactrim Ds 800-160 Mg Tabs (Sulfamethoxazole-trimethoprim) .... Take 1 tablet by mouth two times a day for 6 weeks  Other Orders: T-CMP with Estimated GFR ([id number]) Prescriptions: RANITIDINE HCL 300 MG  CAPS (RANITIDINE HCL) take 1 pill by mouth daily before bed  #30 x 11   Entered and Authorized by:   Bethel Born MD   Signed by:   Bethel Born MD on 02/13/2010   Method used:   Electronically to        [facility]* (retail)       [address]       [address] Shipping/mailing       [place], [place]  [address]       Ph: [phone]       Fax: [phone]   RxID:   [id number]    Orders Added: 1)  T-CBC w/Diff [[id number]] 2)  T-CMP with Estimated GFR [[id number]] 3)  T-Urinalysis [81003-65000] 4)  T-Culture, Urine [[id number]] 5)  T-PSA [[id number]] 6)  Est. Patient Level IV [[address]27] 7)  Urology Referral [Urology]    Process Orders Check Orders Results:     [facility]: ABN not required for this insurance Tests Sent for requisitioning (February 13, 2010 6:19 PM):     02/13/2010: [facility] -- T-CBC w/Diff [[id number]] (signed)     02/13/2010:  [facility] -- T-CMP with Estimated GFR [[id number]] (signed)     02/13/2010: [facility] -- T-Urinalysis [[id number]] (signed)     02/13/2010: [facility] -- T-Culture, Urine [[id number]] (signed)     02/13/2010: [facility] -- T-PSA [phone] (signed)     Appended Document: ACUTE/HO/1 MONTH RECHECK PER [facility]   Medication Administration  Injection # 1:    Medication: Rocephin  250mg     Diagnosis: UNSPECIFIED PROSTATITIS (ICD-601.9)    Route: IM    Site: L deltoid    Exp Date: 08/19/2012    Lot #: [id number]    Mfr: Hospira    Patient tolerated injection without complications    Given by: Angelina Ok RN (February 13, 2010 5:10PM)  Orders Added: 1)  Admin of Therapeutic Inj  intramuscular or subcutaneous [96372] 2)  Rocephin  250mg  [J0696]

## 2010-02-20 NOTE — Progress Notes (Signed)
Summary: Refill/gh  Phone Note Refill Request Message from:  Fax from Pharmacy on January 08, 2010 9:56 AM  Refills Requested: Medication #1:  VENTOLIN HFA 108 (90 BASE) MCG/ACT  AERS take 2 puffs qid as needed.   Last Refilled: 10/31/2009 Last visit was 08/15/2009. Last labs were 10.5.2011.   Method Requested: Fax to Local Pharmacy Initial call taken by: Angelina Ok RN,  January 08, 2010 9:57 AM  Follow-up for Phone Call        Refilled electronically.  Follow-up by: Margarito Liner MD,  January 08, 2010 10:12 AM    Prescriptions: VENTOLIN HFA 108 (90 BASE) MCG/ACT  AERS (ALBUTEROL SULFATE) take 2 puffs qid as needed.  #1 mo supply x 3   Entered and Authorized by:   Margarito Liner MD   Signed by:   Margarito Liner MD on 01/08/2010   Method used:   Electronically to        [facility]* (retail)       [address]       [address] Shipping/mailing       [place], [place]  [address]       Ph: [phone]       Fax: [phone]   RxID:   [id number]

## 2010-02-20 NOTE — Assessment & Plan Note (Signed)
Summary: FU VISIT PER DR. JOINES/HO/DS   Vital Signs:  Patient profile:   50 year old male Height:      67 inches Weight:      169.9 pounds BMI:     26.71 Temp:     97.6 degrees F oral Pulse rate:   69 / minute BP sitting:   126 / 86  (right arm)  Vitals Entered By: Filomena Jungling NT II (August 15, 2009 8:37 AM) CC: SEE NOTE Is Patient Diabetic? No Nutritional Status BMI of 25 - 29 = overweight  Have you ever been in a relationship where you felt threatened, hurt or afraid?No   Does patient need assistance? Functional Status Self care Ambulation Normal   Primary Care Provider:  Rosana Berger MD  CC:  SEE NOTE.  History of Present Illness: Patient was here today for a routine visit and also to discuss about his anxietyproblem.  Patient says that he does have anxiety attacks sometimes and he usually takes the Xanax 0.25 mg prescribed to him. He takes once in 1-2 days and a prescription of 50 pills lasts himfor about 2 month or even more. I tried to explain to him the risks of chronic Xanax and gave him information about Generalized anxiety disorder. He was not really interested in adding another med to his regimen and said that he will stop taking xanax alltogether if it can cause problems.  Patient has been smoking for almsot 30 years and wants to quit, i will refer him to [facility].  He has a very strong family history of CAD and with increasing age and h/o atypical chest pains, he is worried about a heart attack and wants me to refer him for stress test.  BP is well controlled oj current meds. will check [place] crt.  Hx of fatty liver: wnats me check his liver.   He denies any new sicknesses or hospitalizations, no chest pain episodes, no fevers, no chills, no abdominal or urinary concerns. No recent changes in appetite, weight.   Preventive Screening-Counseling & Management  Alcohol-Tobacco     Alcohol type: BEER AT TIMES     Smoking Status: current     Smoking  Cessation Counseling: yes     Packs/Day: 1 pk per week     Year Quit: 01/06/07  Caffeine-Diet-Exercise     Does Patient Exercise: no  Problems Prior to Update: 1)  Dental Caries  (ICD-521.00) 2)  Penile Lesion  (ICD-236.6) 3)  Genital Herpes, Hx of  (ICD-V13.8) 4)  Anxiety  (ICD-300.00) 5)  Chest Pain, Atypical  (ICD-786.59) 6)  Gerd  (ICD-530.81) 7)  Hypertension  (ICD-401.9) 8)  Abdominal Pain, Epigastric  (ICD-789.06)  Medications Prior to Update: 1)  Norvasc 10 Mg  Tabs (Amlodipine Besylate) .... Take 1 Pill By Mouth Daily. 2)  Ranitidine Hcl 300 Mg  Caps (Ranitidine Hcl) .... Take 1 Pill By Mouth Daily Before Bed 3)  Hydrochlorothiazide 25 Mg  Tabs (Hydrochlorothiazide) .... Take 1 Pill By Mouth Daily 4)  Ventolin Hfa 108 (90 Base) Mcg/act  Aers (Albuterol Sulfate) .... Take 2 Puffs Qid As Needed. 5)  Klor-Con M10 10 Meq  Tbcr (Potassium Chloride Crys Cr) .... Take 1 Pill By Mouth Daily. 6)  Valtrex 500 Mg  Tabs (Valacyclovir Hcl) .... Take 1 Pill By Mouth Twice Daily For 3 Days. 7)  Alprazolam 0.25 Mg Tabs (Alprazolam) .... Take 1 Pill By Mouth Three Times A Day As Needed For Anxiety  Current Medications (verified): 1)  Norvasc 10 Mg  Tabs (Amlodipine Besylate) .... Take 1 Pill By Mouth Daily. 2)  Ranitidine Hcl 300 Mg  Caps (Ranitidine Hcl) .... Take 1 Pill By Mouth Daily Before Bed 3)  Hydrochlorothiazide 25 Mg  Tabs (Hydrochlorothiazide) .... Take 1 Pill By Mouth Daily 4)  Ventolin Hfa 108 (90 Base) Mcg/act  Aers (Albuterol Sulfate) .... Take 2 Puffs Qid As Needed. 5)  Klor-Con M10 10 Meq  Tbcr (Potassium Chloride Crys Cr) .... Take 1 Pill By Mouth Daily. 6)  Valtrex 500 Mg  Tabs (Valacyclovir Hcl) .... Take 1 Pill By Mouth Twice Daily For 3 Days.  Allergies (verified): 1)  ! Benadryl  Past History:  Past Medical History: Last updated: 06/25/2009 HTN multiple dental caries with extractions  Past Surgical History: Last updated: 06/25/2009 inguinal hernia repair ?  1977 or so  Social History: Last updated: 06/25/2009 Occupation:  temp agency Current Smoker:  5 cig/day Alcohol use-yes:  1 beer/day Drug use-no  Risk Factors: Exercise: no (08/15/2009)  Risk Factors: Smoking Status: current (08/15/2009) Packs/Day: 1 pk per week (08/15/2009)  Social History: Reviewed history from 06/25/2009 and no changes required. Occupation:  temp agency Current Smoker:  5 cig/day Alcohol use-yes:  1 beer/day Drug use-no  Review of Systems      See HPI  Physical Exam  Additional Exam:  Gen: AOx3, in no acute distress Eyes: PERRL, EOMI ENT:MMM, No erythema noted in posterior pharynx Neck: No JVD, No LAP Chest: CTAB with  good respiratory effort CVS: regular rhythmic rate, NO M/R/G, S1 S2 normal Abdo: soft,ND, BS+x4, Non tender and No hepatosplenomegaly EXT: No odema noted Neuro: Non focal, gait is normal Skin: no rashes noted.    Impression & Recommendations:  Problem # 1:  ANXIETY (ICD-300.00) Assessment Unchanged I gave him information about gen anxiety disorder and options about medications. He does not want to start any chronic meds and said that he will try to stop Xanax if it can create problems.  The following medications were removed from the medication list:    Alprazolam 0.25 Mg Tabs (Alprazolam) .Marland Kitchen... Take 1 pill by mouth three times a day as needed for anxiety  Problem # 2:  TOBACCO ABUSE (ICD-305.1) Assessment: Unchanged Wants to Quit. Patient was counseled on smoking cessation strategies including medications and behavior modification options.  Orders: Social Work Referral (Social )  Problem # 3:  CHEST PAIN, ATYPICAL (ICD-786.59) Assessment: Comment Only I will refer him for a stress test and explained to him about decresing other cardiovascular risks like smoking and healthy diet at the same time.  Orders: Cardiology Other (Cardiology Other)  Problem # 4:  HYPERTENSION (ICD-401.9) Assessment: Improved Well  controlled. Will chekc K and Crt today. Lipid profile for risk startification.  His updated medication list for this problem includes:    Norvasc 10 Mg Tabs (Amlodipine besylate) .Marland Kitchen... Take 1 pill by mouth daily.    Hydrochlorothiazide 25 Mg Tabs (Hydrochlorothiazide) .Marland Kitchen... Take 1 pill by mouth daily  Orders: T-CMP with Estimated GFR ([id number]) T-Lipid Profile ([id number])  BP today: 126/86 Prior BP: 130/85 (06/25/2009)  Labs Reviewed: K+: 3.9 (06/25/2008) Creat: : 1.19 (06/25/2008)   Chol: 197 (06/06/2008)   HDL: 46 (06/06/2008)   LDL: 132 (06/06/2008)   TG: 97 (06/06/2008)  Problem # 5:  ABDOMINAL PAIN, EPIGASTRIC (ICD-789.06) Assessment: Comment Only  Will check hepatic panel today.  Orders: T-CMP with Estimated GFR ([id number])  Discussed symptom control with the patient.   Complete Medication List: 1)  Norvasc 10  Mg Tabs (Amlodipine besylate) .... Take 1 pill by mouth daily. 2)  Ranitidine Hcl 300 Mg Caps (Ranitidine hcl) .... Take 1 pill by mouth daily before bed 3)  Hydrochlorothiazide 25 Mg Tabs (Hydrochlorothiazide) .... Take 1 pill by mouth daily 4)  Ventolin Hfa 108 (90 Base) Mcg/act Aers (Albuterol sulfate) .... Take 2 puffs qid as needed. 5)  Klor-con M10 10 Meq Tbcr (Potassium chloride crys cr) .... Take 1 pill by mouth daily. 6)  Valtrex 500 Mg Tabs (Valacyclovir hcl) .... Take 1 pill by mouth twice daily for 3 days.  Other Orders: T-Syphilis Test ([facility]PR) ([id number])  Patient Instructions: 1)  Please schedule an appointment with your primary doctor after you have your stress test done. 2)  Check your Blood Pressure regularly. If it is above:160/100 you should make an appointment.  Prevention & Chronic Care Immunizations   Influenza vaccine: Not documented    Tetanus booster: Not documented    Pneumococcal vaccine: Not documented  Colorectal Screening   Hemoccult: Not documented    Colonoscopy: Not documented  Other Screening   PSA: Not  documented   Smoking status: current  (08/15/2009)   Smoking cessation counseling: yes  (08/15/2009)  Lipids   Total Cholesterol: 197  (06/06/2008)   LDL: 132  (06/06/2008)   LDL Direct: Not documented   HDL: 46  (06/06/2008)   Triglycerides: 97  (06/06/2008)  Hypertension   Last Blood Pressure: 126 / 86  (08/15/2009)   Serum creatinine: 1.19  (06/25/2008)   Serum potassium 3.9  (06/25/2008)  Self-Management Support :   Personal Goals (by the next clinic visit) :      Personal blood pressure goal: 140/90  (06/25/2009)   Patient will work on the following items until the next clinic visit to reach self-care goals:     Medications and monitoring: take my medicines every day  (08/15/2009)     Eating: drink diet soda or water instead of juice or soda, eat more vegetables, use fresh or frozen vegetables, eat foods that are low in salt, eat baked foods instead of fried foods, eat fruit for snacks and desserts, limit or avoid alcohol  (08/15/2009)     Activity: take a 30 minute walk every day  (08/15/2009)    Hypertension self-management support: Education handout, Written self-care plan, Resources for patients handout  (08/15/2009)   Hypertension self-care plan printed.   Hypertension education handout printed      Resource handout printed.   Process Orders Check Orders Results:     [facility]: ABN not required for this insurance Tests Sent for requisitioning (August 15, 2009 10:59 AM):     08/15/2009: [facility] -- T-CMP with Estimated GFR [[id number]] (signed)     08/15/2009: [facility] -- T-Lipid Profile [id number] (signed)     08/15/2009: [facility] -- T-Syphilis Test ([facility]PR) [phone] (signed)

## 2010-02-20 NOTE — Progress Notes (Signed)
Summary: Refill/gh  Phone Note Refill Request Message from:  Fax from Pharmacy on February 06, 2009 9:18 AM  Refills Requested: Medication #1:  HYDROCHLOROTHIAZIDE 25 MG  TABS take 1 pill by mouth daily   Last Refilled: 12/21/2008 Last labs were 06/25/2008.  Last office visit was 06/19/2008.  Flag to C. Boone to schedule an appointment.   Method Requested: Electronic Initial call taken by: Angelina Ok RN,  February 06, 2009 9:18 AM  Follow-up for Phone Call        Pt needs an office visit.  Follow-up by: Jason Coop MD,  February 06, 2009 10:14 PM    Prescriptions: HYDROCHLOROTHIAZIDE 25 MG  TABS (HYDROCHLOROTHIAZIDE) take 1 pill by mouth daily  #30 x 0   Entered and Authorized by:   Jason Coop MD   Signed by:   Jason Coop MD on 02/06/2009   Method used:   Electronically to        [facility] [phone]* (retail)       [address]       [place], [place]  [address]       Ph: [phone]       Fax: [phone]   RxID:   [id number]

## 2010-02-20 NOTE — Progress Notes (Signed)
Summary: med refill/gp  Phone Note Refill Request Message from:  Fax from Pharmacy on May 22, 2009 4:37 PM  Request refill on Xanax 0.25mg  - Take 1 tablet by mouth 3 times daily. Last refill 09/05/08. Not on current med. list. I talked to pt.; he said he takes it for anxiety attacks;"the other med" makes him feels strange.    Method Requested: Telephone to Pharmacy Initial call taken by: Chinita Pester RN,  May 22, 2009 4:37 PM  Follow-up for Phone Call        Is he not taking Clonapin? Follow-up by: Jason Coop MD,  May 23, 2009 10:04 AM  Additional Follow-up for Phone Call Additional follow up Details #1::        Stated  Clonazepam made him feel "crazy" and the pharmacist told him to stop taking it. Also had crazy dreams. Stated Xanax works better. Additional Follow-up by: Chinita Pester RN,  May 23, 2009 2:27 PM    New/Updated Medications: ALPRAZOLAM 0.25 MG TABS (ALPRAZOLAM) take 1 pill by mouth three times a day as needed for anxiety Prescriptions: ALPRAZOLAM 0.25 MG TABS (ALPRAZOLAM) take 1 pill by mouth three times a day as needed for anxiety  #50 x 0   Entered and Authorized by:   Jason Coop MD   Signed by:   Jason Coop MD on 05/23/2009   Method used:   Telephoned to ...       [facility] [address] [phone]* (retail)       [address]       [place], [place]  [address]       Ph: [phone]       Fax: [phone]   RxID:   [id number]   Appended Document: med refill/gp Alprazolam Rx called to [facility] pharmacy. Pt. was called.

## 2010-02-26 NOTE — Progress Notes (Signed)
Summary: refill/ hla  Phone Note Refill Request Message from:  Fax from Pharmacy on February 07, 2010 6:11 PM  Refills Requested: Medication #1:  RANITIDINE HCL 300 MG  CAPS take 1 pill by mouth daily before bed   Dosage confirmed as above?Dosage Confirmed Initial call taken by: Marin Roberts RN,  February 07, 2010 6:12 PM    Prescriptions: RANITIDINE HCL 300 MG  CAPS (RANITIDINE HCL) take 1 pill by mouth daily before bed  #30 x 3   Entered and Authorized by:   Donia Guiles MD   Signed by:   Donia Guiles MD on 02/17/2010   Method used:   Electronically to        [facility] [phone]* (retail)       [address]       [place], [place]  [address]       Ph: [phone]       Fax: [phone]   RxID:   [id number]

## 2010-02-27 ENCOUNTER — Other Ambulatory Visit: Payer: Self-pay | Admitting: *Deleted

## 2010-02-27 MED ORDER — AMLODIPINE BESYLATE 10 MG PO TABS: 10.0000 mg | ORAL_TABLET | Freq: Every day | ORAL | Status: DC

## 2010-02-27 NOTE — Telephone Encounter (Signed)
Refill approved - nurse to complete. 

## 2010-03-25 ENCOUNTER — Encounter: Payer: Self-pay | Admitting: Internal Medicine

## 2010-03-25 ENCOUNTER — Ambulatory Visit (INDEPENDENT_AMBULATORY_CARE_PROVIDER_SITE_OTHER): Payer: Self-pay | Admitting: Internal Medicine

## 2010-03-25 VITALS — BP 127/83 | HR 87 | Temp 99.1°F | Ht 68.5 in | Wt 174.8 lb

## 2010-03-25 DIAGNOSIS — L309 Dermatitis, unspecified: Secondary | ICD-10-CM | POA: Insufficient documentation

## 2010-03-25 DIAGNOSIS — L259 Unspecified contact dermatitis, unspecified cause: Secondary | ICD-10-CM

## 2010-03-25 DIAGNOSIS — B86 Scabies: Secondary | ICD-10-CM

## 2010-03-25 DIAGNOSIS — N419 Inflammatory disease of prostate, unspecified: Secondary | ICD-10-CM

## 2010-03-25 MED ORDER — DESOXIMETASONE 0.25 % EX CREA: TOPICAL_CREAM | Freq: Two times a day (BID) | CUTANEOUS | Status: DC

## 2010-03-25 MED ORDER — PERMETHRIN 1 % EX LOTN: TOPICAL_LOTION | CUTANEOUS | Status: DC

## 2010-03-25 NOTE — Assessment & Plan Note (Signed)
Diffused papular/erythematous rash that is extremely itching (out of proportional) with mild scaling  On trunk, legs, arms, and axilla.  Rash under axilla was in a linear/burrow presentation.  -Will treat with Elimite lotion, apply from neck to bottom of feet including nail webs.  Then repeat applying Elimite 1 weeks from today.  -Patient is instructed to wash all clothes/sheets/comforters at home.  He may need to discard his old couch that causes the itching which is the likely source of scabies. -Will follow up in 2 weeks if symptoms do not improve and we will consider Dermatology consult since patient does not have insurance and it is very difficult to see a dermatologist.

## 2010-03-25 NOTE — Progress Notes (Signed)
  Subjective:    Patient ID: Jerry Terrell, male    DOB: Jul 23, 1960, 50 y.o.   MRN: [id number]  Rash This is a new problem. The current episode started 1 to 4 weeks ago. The problem has been gradually worsening since onset. The affected locations include the torso, chest, back, abdomen, left axilla, left arm, right axilla, right arm, left buttock, left upper leg, right upper leg, right lower leg and left lower leg. The rash is characterized by redness, scaling, swelling and itchiness. Associated with: old couch  Past treatments include anti-itch cream. The treatment provided no relief. His past medical history is significant for allergies, asthma and eczema.      Review of Systems  Constitutional: Negative.   HENT: Negative.   Eyes: Negative.   Respiratory: Negative.   Cardiovascular: Negative.   Gastrointestinal: Negative.   Genitourinary: Positive for urgency and frequency. Negative for dysuria and difficulty urinating.  Musculoskeletal: Negative.   Skin: Positive for rash.  Neurological: Negative.   Hematological: Negative.   Psychiatric/Behavioral: Negative.        Objective:   Physical Exam  Constitutional: He is oriented to person, place, and time. He appears well-developed and well-nourished.  HENT:  Head: Normocephalic and atraumatic.  Eyes: Pupils are equal, round, and reactive to light.  Cardiovascular: Normal rate, regular rhythm, normal heart sounds and intact distal pulses.   Pulmonary/Chest: Effort normal and breath sounds normal. No respiratory distress. He has no wheezes. He has no rales. He exhibits no tenderness.  Neurological: He is alert and oriented to person, place, and time.  Skin: Skin is dry. Rash noted. There is erythema. No pallor.  Psychiatric: He has a normal mood and affect. His behavior is normal. Judgment and thought content normal.          Assessment & Plan:

## 2010-03-25 NOTE — Assessment & Plan Note (Signed)
Mild improvement.  Denies any soreness; however, he still has urinary incontinence.  His PSA is less than 0.43.  He will need a referral to see Urology

## 2010-03-25 NOTE — Patient Instructions (Signed)
Please apply Permethrin lotion from neck line down to the bottom of your feet today, then repeat 7 days from today. You need to wash all your clothes and sheets/comforters in diluted chlorox Make sure your household members get treated if they develop the same symptoms Follow up with Dr. Anselm Jungling in 2 weeks if symptoms do not resolve/improve We will refer you to a dermatology if your condition does not improve We will refer you to a Urologist for your urinary problem

## 2010-03-29 ENCOUNTER — Telehealth: Payer: Self-pay | Admitting: Internal Medicine

## 2010-03-29 NOTE — Telephone Encounter (Signed)
Patient called regarding rash. He was seen by Dr. Anselm Jungling 03/06 and was started on Permethrin cream for suspected Scabies infection. He followed the recommendations and said the rash reappeared and is now widespread. He initially asked if he could repeat the dose again, however without seeing the rash, I do not feel comfortable asking patient to repeat the dose. He complains of severe itching, and he is allergic to benadryl, thus advised patient to go to the urgent care center to get it looked at. Offered hydroxyzine for symptom management, however patient opted to go to the urgent care center. Agree with this plan. Will route to PCP.

## 2010-03-30 NOTE — Telephone Encounter (Signed)
We already submitted a referral for dermatology; however, patient only has orange card so it might take a while before he can be seen by a dermatologist.  He needs to be reevaluated if rash is getting worse.

## 2010-03-30 NOTE — Telephone Encounter (Signed)
We already scheduled a dermatology appointment for patient; however, he only has the orange card so it might take a while before he can get in to see one. Patient needs to be re-evaluated by a

## 2010-04-07 ENCOUNTER — Telehealth: Payer: Self-pay | Admitting: *Deleted

## 2010-04-07 NOTE — Telephone Encounter (Signed)
Pt called stating that rash has not improved with meds that were given him. He applied X2 with no improvement. He is asking for the referral to dermatology. Family Hx: eczema  Will see tomorrow to re-evaluate

## 2010-04-08 ENCOUNTER — Ambulatory Visit (INDEPENDENT_AMBULATORY_CARE_PROVIDER_SITE_OTHER): Payer: Self-pay | Admitting: Internal Medicine

## 2010-04-08 VITALS — BP 124/84 | HR 60 | Temp 98.5°F | Wt 174.5 lb

## 2010-04-08 DIAGNOSIS — L309 Dermatitis, unspecified: Secondary | ICD-10-CM

## 2010-04-08 DIAGNOSIS — I10 Essential (primary) hypertension: Secondary | ICD-10-CM

## 2010-04-08 DIAGNOSIS — L259 Unspecified contact dermatitis, unspecified cause: Secondary | ICD-10-CM

## 2010-04-08 MED ORDER — AMLODIPINE BESYLATE 10 MG PO TABS: 10.0000 mg | ORAL_TABLET | Freq: Every day | ORAL | Status: DC

## 2010-04-08 MED ORDER — FLUOCINONIDE 0.05 % EX CREA: TOPICAL_CREAM | CUTANEOUS | Status: DC

## 2010-04-08 MED ORDER — HYDROXYZINE HCL 25 MG PO TABS: 25.0000 mg | ORAL_TABLET | Freq: Three times a day (TID) | ORAL | Status: AC | PRN

## 2010-04-08 NOTE — Progress Notes (Signed)
  Subjective:    Patient ID: Jerry Terrell, male    DOB: 1960/09/26, 50 y.o.   MRN: [id number]  HPI Patient is a 50 years old male with past medical history  as outlined here who comes to the Clinic for the skin rashes. It started about 4 weeks ago and this the 1st time.It is located in the torso, chest, back, abdomen, left axilla, left arm, right axilla, right arm, left buttock, left upper leg, right upper leg, right lower leg and left lower leg. The rash is scaling and itchiness. 2 weeks ago he went to [facility] and was given permethrin and topicort, but he did not get topicort for the high cost. He used permethrin twice and these rashes did not improve. Has no fever, chill, chest pain, shortness of breath, hemoptysis, abdominal pain, nausea, vomiting, diarrhea, melena, dysuria, significant weight change.  Denies recent smoking, alcohol or drug abuse. Has been taking all his medications as instructed. He has significant Fx of psoriasis and eczema. Denies unsafe sex.     Review of Systems Per HPI.  Current Outpatient Medications Current Outpatient Prescriptions  Medication Sig Dispense Refill  . albuterol (VENTOLIN HFA) 108 (90 BASE) MCG/ACT inhaler Inhale 2 puffs into the lungs every 4 (four) hours as needed.        Marland Kitchen amLODipine (NORVASC) 10 MG tablet Take 1 tablet (10 mg total) by mouth daily.  30 tablet  3  . desoximetasone (TOPICORT) 0.25 % cream Apply topically 2 (two) times daily.  30 g  0  . hydrochlorothiazide 25 MG tablet Take 25 mg by mouth daily.        . permethrin (ELIMITE) 1 % lotion Apply from neck down to the bottom of your feet once, then repeat again in 7 days.  120 mL  0  . Potassium Chloride Crys CR (KLOR-CON M10 PO) Take 1 tablet by mouth daily.        . ranitidine (ZANTAC) 300 MG capsule Take 300 mg by mouth at bedtime.        . tadalafil (CIALIS) 10 MG tablet Take 1 pill by mouth 30 minutes before sexual activity         Allergies Diphenhydramine hcl  Past Medical History    Diagnosis Date  . Hypertension   . GERD (gastroesophageal reflux disease)   . Asthma   . Chronic kidney disease   . Bronchitis   . Migraine     Past Surgical History  Procedure Date  . Hernia repair ?1977    Inguinal  . Cardiac catheterization 2008       Objective:   Physical Exam General: Vital signs reviewed and noted. Well-developed,well-nourished,in no acute distress; alert,appropriate and cooperative throughout examination. Head: normocephalic, atraumatic. Neck: No deformities, masses, or tenderness noted. Lungs: Normal respiratory effort. Clear to auscultation BL without crackles or wheezes.  Heart: RRR. S1 and S2 normal without gallop, murmur, or rubs.  Abdomen: BS normoactive. Soft, Nondistended, non-tender.  No masses or organomegaly. Extremities: No pretibial edema. Skin: there are multiple rashes in the torso, chest, back, abdomen, left axilla, left arm, right axilla, right arm, left buttock, left upper leg, right upper leg, right lower leg and left lower leg, size about 0.5 to 3 cm big, no exudates or redness, but some scaling.        Assessment & Plan:

## 2010-04-08 NOTE — Assessment & Plan Note (Addendum)
It seems that his rashes did not get better with permethrin, so scabies is unlikely. May be due to fungal, early psoriasis? Considering Fx of of this or other skin disorders. I have talked with Attending Dr. Aundria Rud and will give him lidex before he see dermatologist. Give him hydroxyzine for itching as allergic to benadryl.

## 2010-04-08 NOTE — Patient Instructions (Signed)
Please take all your medications as instructed in your instructions.   We will call you if once the dermatology appointment is set up.

## 2010-04-08 NOTE — Assessment & Plan Note (Signed)
Lab Results  Component Value Date   NA 136 02/13/2010   K 3.6 02/13/2010   CL 98 02/13/2010   CO2 27 02/13/2010   BUN 14 02/13/2010   CREATININE 1.32 02/13/2010    BP Readings from Last 3 Encounters:  04/08/10 124/84  03/25/10 127/83  02/13/10 138/90    BP well controlled and will refill norvasc for him.

## 2010-04-09 ENCOUNTER — Other Ambulatory Visit: Payer: Self-pay | Admitting: *Deleted

## 2010-04-09 MED ORDER — ALBUTEROL SULFATE HFA 108 (90 BASE) MCG/ACT IN AERS: 2.0000 | INHALATION_SPRAY | RESPIRATORY_TRACT | Status: DC | PRN

## 2010-04-09 NOTE — Telephone Encounter (Signed)
Albuterol refill request form faxed to [facility].

## 2010-04-29 LAB — HERPES SIMPLEX VIRUS CULTURE

## 2010-05-06 LAB — DIFFERENTIAL
Eosinophils Absolute: 0 10*3/uL (ref 0.0–0.7)
Eosinophils Relative: 1 % (ref 0–5)
Lymphs Abs: 2.3 10*3/uL (ref 0.7–4.0)
Monocytes Absolute: 0.3 10*3/uL (ref 0.1–1.0)
Monocytes Relative: 5 % (ref 3–12)

## 2010-05-06 LAB — URINE MICROSCOPIC-ADD ON

## 2010-05-06 LAB — URINALYSIS, ROUTINE W REFLEX MICROSCOPIC
Bilirubin Urine: NEGATIVE
Glucose, UA: NEGATIVE mg/dL
Ketones, ur: NEGATIVE mg/dL
Leukocytes, UA: NEGATIVE
pH: 5 (ref 5.0–8.0)

## 2010-05-06 LAB — COMPREHENSIVE METABOLIC PANEL
ALT: 55 U/L — ABNORMAL HIGH (ref 0–53)
AST: 39 U/L — ABNORMAL HIGH (ref 0–37)
Albumin: 4.5 g/dL (ref 3.5–5.2)
CO2: 27 mEq/L (ref 19–32)
Calcium: 9.9 mg/dL (ref 8.4–10.5)
GFR calc Af Amer: >60 mL/min (ref >60)
GFR calc non Af Amer: 57 mL/min — ABNORMAL LOW (ref >60)
Sodium: 134 mEq/L — ABNORMAL LOW (ref 135–145)
Total Protein: 8.5 g/dL — ABNORMAL HIGH (ref 6.0–8.3)

## 2010-05-06 LAB — SAMPLE TO BLOOD BANK

## 2010-05-06 LAB — CBC
MCHC: 34.8 g/dL (ref 30.0–36.0)
RBC: 5.36 MIL/uL (ref 4.22–5.81)

## 2010-06-03 NOTE — Cardiovascular Report (Signed)
NAMEALVA, BROXSON NO.:  [id number]   MEDICAL RECORD NO.:  [id number]          PATIENT TYPE:  INP   LOCATION:  4741                         FACILITY:  [facility]   PHYSICIAN:  Ricki Rodriguez, M.D.  DATE OF BIRTH:  [birth year]   DATE OF PROCEDURE:  01/06/2007  DATE OF DISCHARGE:  01/06/2007                            CARDIAC CATHETERIZATION   REFERRING PHYSICIAN:  Alvester Morin, M.D.   PROCEDURE:  Left heart catheterization, selective coronary angiography,  left ventricular function study.   INDICATION:  This is a 50 year old black male with a history of smoking  and family history of coronary artery disease who had typical chest  pain.   APPROACH:  Right femoral artery using 4-French sheath and catheter.   COMPLICATIONS:  None.   HEMODYNAMIC DATA:  The left ventricular pressure was 132/15 and aortic  pressure was 131/74.   Less than 40 mL of dye was used.   CORONARY ANATOMY:  The left main coronary artery was normal.   Left anterior descending coronary artery:  The left anterior descending  coronary artery was also normal.   Left circumflex coronary artery:  The left circumflex coronary artery  was normal.   Right coronary artery:  The right coronary was dominant and his  posterolateral branch and posterior descending coronary arteries were  also normal.   LEFT VENTRICULOGRAM:  The left ventriculogram showed mild apical  hypokinesia with ejection fraction of 60%.   IMPRESSION:  1. Normal coronaries.  2. Preserved left ventricular systolic function.   RECOMMENDATION:  This patient will continue medical therapy with  lifestyle modifications.     Ricki Rodriguez, M.D.  Electronically Signed    ASK/MEDQ  D:  01/06/2007  T:  01/07/2007  Job:  [id number]

## 2010-06-06 NOTE — Discharge Summary (Signed)
NAME:  Jerry Terrell, Jerry Terrell NO.:  [id number]   MEDICAL RECORD NO.:  [id number]          PATIENT TYPE:  INP   LOCATION:  4741                         FACILITY:  [facility]   PHYSICIAN:  Alvester Morin, M.D.  DATE OF BIRTH:  Sep 27, 1960   DATE OF ADMISSION:  01/05/2007  DATE OF DISCHARGE:  01/06/2007                               DISCHARGE SUMMARY   CHIEF COMPLAINT:  Chest pain.   DISCHARGE DIAGNOSES:  1. Chest pain, noncardiac.  2. Gastroesophageal reflux disease.  3. Migraines.  4. Hypertension.  5. Left-sided numbness.  6. Fatty liver.  7. Hepatic hemangioma.  8. Kidney stone with a history of kidney stones.  9. History of hypoglycemia.  10.Chronic bronchitis.  11.History of prostatitis.  12.History of herpes simplex virus (HSV).   DISCHARGE MEDICATIONS:  1. Norvasc 5 mg daily.  2. Prilosec 20 mg b.i.d.  3. Albuterol two puffs q.i.d. as needed.  4. Hydrochlorothiazide 25 mg daily.  5. Potassium chloride 10 mEq b.i.d.   STUDIES:  1. Two-D echo on January 06, 2007.  Impression:  LVFF was normal,      ejection fraction  65%, no regional wall motion abnormalities, mild pulmonic regurg.  1. Left heart cath, January 06, 2007.  Impression:  Normal      coronaries, ejection fraction approximately 60%.   CULTURES:  None.   CONSULTATIONS:  Cardiology, Dr. Sherri Rad.   DISPOSITION AND FOLLOWUP:  The patient will follow up with the [facility] and will call for appointment, [phone], sometime  after December 29th to schedule an appointment.  He will also follow up  with Dr. Algie Coffer in approximately one month, phone number [phone].  The  patient was given prescriptions for Albuterol, hydrochlorothiazide, and  potassium chloride on the date of discharge.   BRIEF HISTORY AND PHYSICAL:  Mr. Reppond is a 50 year old, African-  American male with a past medical history of hypertension, GERD, fatty  liver, who presents with a two to three month  history of numbness in his  left leg and arm, and a two to three week history of chest pressure.  The day prior to admission, the patient reports having a lot to do at  work and started to feel chest pressure when he exerted himself, but he  continued to work.  On the day of admission, he woke up and moved around  the house a little and started to get the chest pressure again.  It was  associated with diaphoresis, shortness of breath, palpitations, and  lightheadedness.  The sensation was a tightness in the left chest, which  extended medially toward the sternum.  The patient got scared and came  to the ED.  He also has some GERD pain when he eats, and these two pains  he reports as both being present but not similar.  The patient also  complains of left-sided weakness and soreness in his left arm, which  feels like a muscle spasm, and his chest starts to tighten when he feels  the weakness and numbness on the left side and his heart  starts to  flutter.  The left-sided numbness can be brought on by a full stomach.   ALLERGIES:  The patient has an ALLERGY TO BENADRYL, which causes hives.   PAST MEDICAL HISTORY:  1. Chronic hepatitis B diagnosed in 2002.  2. Fatty liver.  3. Hepatic subcapsular hemangioma.  4. Acid reflux.  5. History of some kidney stones.  6. Hypertension.  7. Anxiety.  8. History of hypoglycemia, which was worked up in 2002 and was      negative, proinsulin C-peptide and insulin moles were all checked,      and a hemoglobin A1c normal.  9. Peptic ulcer disease.  10.Chronic bronchitis.  11.History of prostatitis.  12.History of HSV.  13.History of atypical chest pain thought to be musculoskeletal.   MEDICATIONS ON ADMISSION:  1. Prilosec 5 mg daily.  2. Norvasc 20 mg daily.  3. Percocet 5/325 using as needed.   The patient is a current smoker of one cigarette per day x the past two  years, and he stopped smoking heavily 15 years ago.  He drinks   approximately one beer per day and denies cocaine or IV drug use.  The  patient is divorced, works as an Personnel officer, he has [employer] and lives in [place].   FAMILY HISTORY:  Significant for multiple coronary artery disease.  The  patient had a mother, who died at 72 from a heart attack and also had  hypertension and stroke.  The patient had a father, who died at 36 from  a heart attack and had coronary artery disease and strokes as well.  The  patient has siblings, one 78 and one 84.  His one sister that was 21 had  three cabbages and died from breast cancer at the age of 76.  His other  sister is 4 and was diagnosed with an artery blockage of the heart at  30.  He has three children, which are healthy.   REVIEW OF SYSTEMS:  Positive for fatigue, night sweats, edema on and off  occurring in his feet, some diarrhea, some abdominal pain, and some  weakness and numbness on his left side.  Otherwise, his other systems  are negative unless noted in the HPI.   VITAL SIGNS:  Temperature 98.8, blood pressure 129/85, pulse 65,  respiratory rate 20, saturation is 98% on room air.   PHYSICAL EXAMINATION:  GENERAL:  The patient is in no acute distress but  having 5 out of 10 left-sided chest pain.  EYES:  Pupils even and equally reactive to light and accommodation,  extraocular movements intact.  THORAX:  The pain in his chest is reproducible by pressing lateral to  the left nipple.  The pain radiates toward the center of the chest.  The  pain can also be reproducible or increased by movement of the left arm  medially.  NECK:  No thyromegaly, no lymphadenopathy.  RESPIRATORY:  Clear to auscultation bilaterally.  CARDIOVASCULAR:  Regular rate and rhythm, tachycardia, good peripheral  pulses.  GI:  Soft, tender to palpation diffusely in the lower quadrant.  EXTREMITIES:  No edema.  GU:  Some questionable right CVA tenderness.  SKIN:  Moist.  LYMPH:  No LAD,  inguinal, cervical, or axillary.  MUSCULOSKELETAL:  5 out of 5 strength in all four extremities.  NEURO:  Alert and oriented x3, cranial nerves II-XII intact, Babinski is  negative, sensation is decreased to light touch on the entire left side,  face, upper  extremity, and lower extremity, but no specific dermatomal  distribution.  PSYCH:  He is appropriate and talkative.   ADMISSION LABORATORY DATA:  CBC:  White count 6.1, hemoglobin 15.3,  platelets 306.  Electrolytes:  Sodium 136, potassium 3.4, chloride 102,  bicarb 27, BUN 10, creatinine 1.09, and glucose 105.  LFTs:  T-bili at  1.5, alk-phos 8.9, AST 33, ALT 48, protein 7.2, albumin 3.9, and calcium  9.5.  Point-of-care cardiac enzymes were negative, a D-dimer was normal  at 0.23, and a chest x-ray showed no acute cardiopulmonary disease and  some chronically stable interstitial markings.   HOSPITAL COURSE BY PROBLEM:  1. Chest pain:  Although the patient is not officially being managed      for coronary artery disease, he has a sharp family history of      coronary artery disease and ACS.  He smokes and has hypertension.      Therefore, he is brought in to rule out an acute MI.  The patient's      story varied between typical with left-sided chest pain, numbness      in his left upper extremity, diaphoresis, and shortness of breath      from the pain, the pain lasting five minutes with acute onset, to      some atypical features, which included pain reproducible by      movement of the arm and palpation of the left breast and other      atypical features such as the pain getting better with eating and      being less severe than his GERD pain.  The patient had an EKG done      which was normal sinus rhythm with some nonspecific T-wave      inversions, but no acute coronary event, the patient had three sets      of cardiac enzymes, which were negative, however, due to the dual      nature of the patient's story and a moderately  high pre-test      probability for a coronary event, Cardiology was called, Dr.      Sherri Rad came in and saw the patient, and a two-D echo was      ordered.  The two-D echo was normal showing normal LVFF and an      ejection fraction of approximately 65%.  The patient was taken for      catheterization on day number one, hospital course number one.  The      left-sided heart catheterization was completely normal showing      normal coronary arteries and an ejection fraction of 60%.  The      patient was discharged home and I felt that his chest pain was      likely related to musculoskeletal pain.  Other considerations      initially included aortic dissection, but patient being      hemodynamically stable with a normal blood pressure, this was felt      less likely.  A possible pneumonia, but patient remained afebrile      with a normal white count and was not having any sputum.  The      patient did not have a tension pneumothorax and maintained good      breath sounds on both sides.  It was felt this could be cocaine-      induced, but a urine drug screen was negative for cocaine but was  medically positive for marijuana.  2. Left-sided numbness:  The patient's left-sided numbness is a very      atypical pattern and no single lesion could be responsible for the      left-sided numbness in the face, arm, and leg.  On hospital day      number one, this numbness had gone away and was not present during      the hospital course.  The patient states the numbness occurs most      often when he wakes up in the morning and can last for up to five      minutes.  Therefore, it seems to be positional, but if this      continues we can continue to work this up as an outpatient.  3. GERD and peptic ulcer disease:  The patient was complaining of GERD      while here.  He was started on Protonix and this was continued.  At      the time of discharge, the patient was also given Tums       prophylactically as he needed them and this seemed to control this      GI upset.  4. Hypertension:  The patient's blood pressure remained normal      throughout his hospital course and ranged from 120s on admission to      the 110s.  The patient was continued on his HCTZ and at discharge      was started on Norvasc 5 mg as well as HCTZ 25 mg.  The patient      will follow up with Dr. Sherri Rad to help with his      cardiovascular management.  This problem remained stable throughout      his hospital course.  5. Migraines:  The patient has a history of migraines and felt as      though a migraine was coming on.  The patient stated that Toradol      had helped in the past and the patient was given one dose of      Toradol as well as Tylenol p.r.n.  This alleviated the migraine and      the patient had no further complaints from this problem while he      was in the hospital.  6. Hyperlipidemia:  Because of the patient's questionable coronary      artery disease, a fasting lipid profile was checked and showed an      LDL of greater than 100 and a total cholesterol of 180,      triglycerides of 98, and HDL 33.  It would probably not be      unreasonable to start the patient on a statin drug; however, this      can be followed up as an outpatient.  7. Hypokalemia:  The patient was initially hypokalemic at 3.4.  This      was repleted orally and a magnesium level was checked, and this was      normal at 1.9.  The hypokalemia was likely secondary to patient's      diuretic use, and the patient was started on KCL as an outpatient.   DISCHARGE LABS AND VITAL SIGNS:  Temperature 98.3, blood pressure  114/73, pulse 58, respiratory rate 18, saturation 100% on room air.  Labs:  White count 14.6, hemoglobin 14.4, and platelets 286.  Electrolytes:  Sodium 137, potassium 3.8, chloride 103, bicarb 28, BUN  10, creatinine 1.09, and  a glucose of 104.  Magnesium level was checked  and found to be  1.9.      Tacey Ruiz, MD  Electronically Signed      Alvester Morin, M.D.  Electronically Signed    JP/MEDQ  D:  01/07/2007  T:  01/07/2007  Job:  [id number]   cc:   Ricki Rodriguez, M.D.

## 2010-08-01 ENCOUNTER — Encounter: Payer: Self-pay | Admitting: Internal Medicine

## 2010-08-18 ENCOUNTER — Emergency Department (HOSPITAL_COMMUNITY): Payer: Self-pay

## 2010-08-18 ENCOUNTER — Emergency Department (HOSPITAL_COMMUNITY)
Admission: EM | Admit: 2010-08-18 | Discharge: 2010-08-18 | Disposition: A | Discharge status: Home / Self Care | Payer: Self-pay | Attending: Emergency Medicine | Admitting: Emergency Medicine

## 2010-08-18 DIAGNOSIS — G43909 Migraine, unspecified, not intractable, without status migrainosus: Secondary | ICD-10-CM | POA: Insufficient documentation

## 2010-08-18 DIAGNOSIS — Z79899 Other long term (current) drug therapy: Secondary | ICD-10-CM | POA: Insufficient documentation

## 2010-08-18 DIAGNOSIS — I10 Essential (primary) hypertension: Secondary | ICD-10-CM | POA: Insufficient documentation

## 2010-08-18 DIAGNOSIS — R110 Nausea: Secondary | ICD-10-CM | POA: Insufficient documentation

## 2010-08-18 DIAGNOSIS — H53149 Visual discomfort, unspecified: Secondary | ICD-10-CM | POA: Insufficient documentation

## 2010-08-18 DIAGNOSIS — R42 Dizziness and giddiness: Secondary | ICD-10-CM | POA: Insufficient documentation

## 2010-08-18 DIAGNOSIS — I2510 Atherosclerotic heart disease of native coronary artery without angina pectoris: Secondary | ICD-10-CM | POA: Insufficient documentation

## 2010-08-18 DIAGNOSIS — R209 Unspecified disturbances of skin sensation: Secondary | ICD-10-CM | POA: Insufficient documentation

## 2010-08-18 DIAGNOSIS — K219 Gastro-esophageal reflux disease without esophagitis: Secondary | ICD-10-CM | POA: Insufficient documentation

## 2010-10-16 ENCOUNTER — Inpatient Hospital Stay (INDEPENDENT_AMBULATORY_CARE_PROVIDER_SITE_OTHER)
Admission: RE | Admit: 2010-10-16 | Discharge: 2010-10-16 | Disposition: A | Discharge status: Home / Self Care | Payer: Self-pay | Source: Ambulatory Visit | Attending: Family Medicine | Admitting: Family Medicine

## 2010-10-16 DIAGNOSIS — J40 Bronchitis, not specified as acute or chronic: Secondary | ICD-10-CM

## 2010-10-16 LAB — COMPREHENSIVE METABOLIC PANEL
AST: 33
CO2: 24
Calcium: 9.2
Creatinine, Ser: 1.04
GFR calc Af Amer: >60
GFR calc non Af Amer: >60
Total Protein: 6.7

## 2010-10-16 LAB — CBC
MCHC: 34.9
MCV: 92.2
Platelets: 262
RBC: 4.93
RDW: 13.5
WBC: 5

## 2010-10-16 LAB — URINE MICROSCOPIC-ADD ON

## 2010-10-16 LAB — URINALYSIS, ROUTINE W REFLEX MICROSCOPIC
Bilirubin Urine: NEGATIVE
Nitrite: NEGATIVE
Protein, ur: 30 — AB
Specific Gravity, Urine: 1.021
Urobilinogen, UA: 1

## 2010-10-16 LAB — DIFFERENTIAL
Eosinophils Relative: 2
Lymphocytes Relative: 52 — ABNORMAL HIGH
Lymphs Abs: 2.6
Monocytes Relative: 9

## 2010-10-24 LAB — CBC
HCT: 41.8
Hemoglobin: 14.4
MCHC: 34.3
Platelets: 286
Platelets: 306
RBC: 4.51
RBC: 4.78
WBC: 4.6

## 2010-10-24 LAB — DIFFERENTIAL
Basophils Absolute: 0.1
Basophils Relative: 1
Eosinophils Absolute: 0.1 — ABNORMAL LOW
Monocytes Relative: 8
Neutro Abs: 2.1
Neutrophils Relative %: 34 — ABNORMAL LOW

## 2010-10-24 LAB — RAPID URINE DRUG SCREEN, HOSP PERFORMED
Amphetamines: NOT DETECTED
Opiates: POSITIVE — AB
Tetrahydrocannabinol: POSITIVE — AB

## 2010-10-24 LAB — CK TOTAL AND CKMB (NOT AT ARMC): Relative Index: 0.7

## 2010-10-24 LAB — BASIC METABOLIC PANEL
BUN: 10
CO2: 28
Chloride: 103
Creatinine, Ser: 1.09
Glucose, Bld: 104 — ABNORMAL HIGH
Potassium: 3.8

## 2010-10-24 LAB — URINALYSIS, ROUTINE W REFLEX MICROSCOPIC
Ketones, ur: NEGATIVE
Nitrite: NEGATIVE
Specific Gravity, Urine: 1.028
Urobilinogen, UA: 1
pH: 6

## 2010-10-24 LAB — POCT CARDIAC MARKERS
CKMB, poc: <1 — ABNORMAL LOW
Troponin i, poc: <0.05

## 2010-10-24 LAB — COMPREHENSIVE METABOLIC PANEL
ALT: 48
AST: 33
Albumin: 3.9
CO2: 27
Calcium: 9.5
Creatinine, Ser: 1.09
GFR calc Af Amer: >60
Sodium: 136
Total Protein: 7.2

## 2010-10-24 LAB — HEMOGLOBIN A1C
Hgb A1c MFr Bld: 6.2 — ABNORMAL HIGH
Mean Plasma Glucose: 143

## 2010-10-24 LAB — PROTIME-INR
INR: 0.9
Prothrombin Time: 12

## 2010-10-24 LAB — D-DIMER, QUANTITATIVE: D-Dimer, Quant: 0.23

## 2010-10-24 LAB — I-STAT 8, (EC8 V) (CONVERTED LAB)
Acid-Base Excess: 1
Chloride: 103
HCT: 48
Operator id: 161631
Potassium: 3.5
Sodium: 137
TCO2: 29
pH, Ven: 7.353 — ABNORMAL HIGH

## 2010-10-24 LAB — APTT: aPTT: 29

## 2010-10-24 LAB — LIPID PANEL
Cholesterol: 180
LDL Cholesterol: 127 — ABNORMAL HIGH
Triglycerides: 98

## 2010-10-24 LAB — POCT I-STAT CREATININE: Operator id: 161631

## 2010-10-24 LAB — CARDIAC PANEL(CRET KIN+CKTOT+MB+TROPI): CK, MB: 0.7

## 2010-10-29 LAB — POCT I-STAT CREATININE
Creatinine, Ser: 1.3
Operator id: 234501

## 2010-10-29 LAB — URINALYSIS, ROUTINE W REFLEX MICROSCOPIC
Bilirubin Urine: NEGATIVE
Ketones, ur: NEGATIVE
Nitrite: NEGATIVE
pH: 6

## 2010-10-29 LAB — URINE CULTURE: Colony Count: NO GROWTH

## 2010-10-29 LAB — I-STAT 8, (EC8 V) (CONVERTED LAB)
Acid-base deficit: 1
HCT: 47
Hemoglobin: 16
Operator id: 234501
Potassium: 4.1
Sodium: 138
TCO2: 26
pH, Ven: 7.365 — ABNORMAL HIGH

## 2010-10-29 LAB — URINE MICROSCOPIC-ADD ON

## 2010-10-31 LAB — SAMPLE TO BLOOD BANK

## 2010-10-31 LAB — I-STAT 8, (EC8 V) (CONVERTED LAB)
Acid-Base Excess: 3 — ABNORMAL HIGH
BUN: 12
Bicarbonate: 27.7 — ABNORMAL HIGH
Chloride: 102
HCT: 52
Hemoglobin: 17.7 — ABNORMAL HIGH
Operator id: 285491
Sodium: 135

## 2010-10-31 LAB — URINE MICROSCOPIC-ADD ON

## 2010-10-31 LAB — URINE CULTURE: Colony Count: 2000

## 2010-10-31 LAB — URINALYSIS, ROUTINE W REFLEX MICROSCOPIC
Glucose, UA: NEGATIVE
Hgb urine dipstick: NEGATIVE
Ketones, ur: NEGATIVE
Leukocytes, UA: NEGATIVE
Protein, ur: 100 — AB

## 2010-10-31 LAB — RAPID URINE DRUG SCREEN, HOSP PERFORMED
Barbiturates: NOT DETECTED
Opiates: POSITIVE — AB
Tetrahydrocannabinol: POSITIVE — AB

## 2010-10-31 LAB — CBC
MCHC: 34.8
Platelets: 271
RDW: 12.9

## 2010-10-31 LAB — PROTIME-INR: INR: 0.9

## 2010-11-02 ENCOUNTER — Other Ambulatory Visit: Payer: Self-pay | Admitting: Internal Medicine

## 2010-11-02 DIAGNOSIS — I10 Essential (primary) hypertension: Secondary | ICD-10-CM

## 2010-11-03 LAB — CBC
MCV: 93.1
RBC: 4.7
WBC: 7.1

## 2010-11-03 LAB — DIFFERENTIAL
Eosinophils Absolute: 0.2
Lymphocytes Relative: 52 — ABNORMAL HIGH
Lymphs Abs: 3.7 — ABNORMAL HIGH
Monocytes Relative: 8
Neutro Abs: 2.6
Neutrophils Relative %: 37 — ABNORMAL LOW

## 2010-11-03 LAB — POCT URINALYSIS DIP (DEVICE)
Glucose, UA: NEGATIVE
Operator id: 247071
Specific Gravity, Urine: >=1.03
Urobilinogen, UA: 1

## 2010-11-10 NOTE — Progress Notes (Signed)
Addended by: Neomia Dear on: 11/10/2010 06:57 PM   Modules accepted: Orders

## 2010-12-23 ENCOUNTER — Encounter: Payer: Self-pay | Admitting: Internal Medicine

## 2010-12-26 ENCOUNTER — Encounter (HOSPITAL_COMMUNITY): Payer: Self-pay | Admitting: *Deleted

## 2010-12-26 ENCOUNTER — Emergency Department (INDEPENDENT_AMBULATORY_CARE_PROVIDER_SITE_OTHER)
Admission: EM | Admit: 2010-12-26 | Discharge: 2010-12-26 | Disposition: A | Discharge status: Home / Self Care | Payer: Self-pay | Source: Home / Self Care | Attending: Emergency Medicine | Admitting: Emergency Medicine

## 2010-12-26 ENCOUNTER — Telehealth (HOSPITAL_COMMUNITY): Payer: Self-pay | Admitting: *Deleted

## 2010-12-26 DIAGNOSIS — R6889 Other general symptoms and signs: Secondary | ICD-10-CM

## 2010-12-26 MED ORDER — DEXTROMETHORPHAN POLISTIREX 30 MG/5ML PO LQCR: 60.0000 mg | ORAL | Status: DC | PRN

## 2010-12-26 MED ORDER — ACETAMINOPHEN-CODEINE #3 300-30 MG PO TABS: 1.0000 | ORAL_TABLET | Freq: Four times a day (QID) | ORAL | Status: AC | PRN

## 2010-12-26 MED ORDER — DEXTROMETHORPHAN POLISTIREX 30 MG/5ML PO LQCR: 60.0000 mg | ORAL | Status: AC | PRN

## 2010-12-26 NOTE — ED Provider Notes (Signed)
History     CSN: [id number] Arrival date & time: 12/26/2010  8:19 AM   First MD Initiated Contact with Patient 12/26/10 [phone]      Chief Complaint  Patient presents with  . Cough  . Fever    (Consider location/radiation/quality/duration/timing/severity/associated sxs/prior treatment) Patient is a 50 y.o. male presenting with cough and fever. The history is provided by the patient.  Cough This is a new problem. The current episode started 2 days ago. The problem occurs every few minutes. The problem has not changed since onset.The cough is non-productive. The maximum temperature recorded prior to his arrival was 100 to 100.9 F. The fever has been present for 1 to 2 days. Associated symptoms include chills, headaches, rhinorrhea, sore throat and myalgias. Pertinent negatives include no chest pain, no ear pain, no shortness of breath and no wheezing. He has tried decongestants for the symptoms. The treatment provided mild relief. He is not a smoker. His past medical history is significant for asthma. His past medical history does not include pneumonia or COPD.  Fever Primary symptoms of the febrile illness include fever, headaches, cough and myalgias. Primary symptoms do not include wheezing or shortness of breath.    Past Medical History  Diagnosis Date  . Hypertension   . GERD (gastroesophageal reflux disease)   . Asthma   . Chronic kidney disease   . Bronchitis   . Migraine     Past Surgical History  Procedure Date  . Hernia repair ?1977    Inguinal  . Cardiac catheterization 2008    Family History  Problem Relation Age of Onset  . Stroke Mother   . Heart disease Mother   . Coronary artery disease Mother   . Heart disease Father   . Hypertension Father   . Heart disease Sister   . Coronary artery disease Sister     History  Substance Use Topics  . Smoking status: Current Everyday Smoker -- 0.5 packs/day for 25 years    Types: Cigarettes  . Smokeless tobacco: Not  on file   Comment: States he's cutting back  . Alcohol Use: Yes     1 beer/day      Review of Systems  Constitutional: Positive for fever and chills.  HENT: Positive for sore throat and rhinorrhea. Negative for ear pain.   Respiratory: Positive for cough. Negative for shortness of breath and wheezing.   Cardiovascular: Negative for chest pain.  Musculoskeletal: Positive for myalgias.  Neurological: Positive for headaches.    Allergies  Aspirin; Diphenhydramine hcl; and Ibuprofen  Home Medications   Current Outpatient Rx  Name Route Sig Dispense Refill  . ALBUTEROL SULFATE HFA 108 (90 BASE) MCG/ACT IN AERS Inhalation Inhale 2 puffs into the lungs every 4 (four) hours as needed. 3 Inhaler 2  . AMLODIPINE BESYLATE 10 MG PO TABS Oral Take 1 tablet (10 mg total) by mouth daily. 90 tablet 3  . HYDROCHLOROTHIAZIDE 25 MG PO TABS Oral Take 1 tablet (25 mg total) by mouth daily. 30 tablet 5  . KLOR-CON M10 PO Oral Take 1 tablet by mouth daily.      Marland Kitchen RANITIDINE HCL 300 MG PO CAPS Oral Take 300 mg by mouth at bedtime.      Marland Kitchen TADALAFIL 10 MG PO TABS  Take 1 pill by mouth 30 minutes before sexual activity     . ACETAMINOPHEN-CODEINE #3 300-30 MG PO TABS Oral Take 1-2 tablets by mouth every 6 (six) hours as needed for pain. 15  tablet 0  . DEXTROMETHORPHAN POLISTIREX ER 30 MG/5ML PO LQCR Oral Take 10 mLs (60 mg total) by mouth as needed for cough. 89 mL 0  . DEXTROMETHORPHAN POLISTIREX ER 30 MG/5ML PO LQCR Oral Take 10 mLs (60 mg total) by mouth as needed for cough. 89 mL 0  . FLUOCINONIDE 0.05 % EX CREA  Apply to affected area twice a day 30 g 1    BP 109/68  Pulse 85  Temp(Src) 100 F (37.8 C) (Oral)  Resp 18  SpO2 100%  Physical Exam  Nursing note and vitals reviewed. Constitutional: He appears well-developed and well-nourished.  HENT:  Head: Normocephalic.  Nose: Rhinorrhea present.  Mouth/Throat: Uvula is midline, oropharynx is clear and moist and mucous membranes are normal.    Neck: Trachea normal and normal range of motion. No JVD present. No Kernig's sign noted.  Cardiovascular: Normal rate.   Pulmonary/Chest: Effort normal and breath sounds normal. No respiratory distress. He has no decreased breath sounds. He has no wheezes. He has no rhonchi. He has no rales.  Lymphadenopathy:    He has no cervical adenopathy.  Neurological: He is alert.  Skin: Skin is warm.    ED Course  Procedures (including critical care time)  Labs Reviewed - No data to display No results found.   1. Influenza-like illness       MDM  ILI x 48 hrs. Normal exma and mild Upper respiratory congestion        Jimmie Molly, MD 12/26/10 1818

## 2010-12-26 NOTE — ED Notes (Signed)
Pt c/o productive cough of yellow sputum, fever, bodyaches, chills, headache onset yesterday.

## 2010-12-27 ENCOUNTER — Emergency Department (HOSPITAL_COMMUNITY)
Admission: EM | Admit: 2010-12-27 | Discharge: 2010-12-27 | Disposition: A | Discharge status: Home / Self Care | Payer: Self-pay | Attending: Emergency Medicine | Admitting: Emergency Medicine

## 2010-12-27 ENCOUNTER — Emergency Department (HOSPITAL_COMMUNITY): Payer: Self-pay

## 2010-12-27 ENCOUNTER — Encounter (HOSPITAL_COMMUNITY): Payer: Self-pay | Admitting: Emergency Medicine

## 2010-12-27 DIAGNOSIS — J45909 Unspecified asthma, uncomplicated: Secondary | ICD-10-CM | POA: Insufficient documentation

## 2010-12-27 DIAGNOSIS — Z79899 Other long term (current) drug therapy: Secondary | ICD-10-CM | POA: Insufficient documentation

## 2010-12-27 DIAGNOSIS — R059 Cough, unspecified: Secondary | ICD-10-CM | POA: Insufficient documentation

## 2010-12-27 DIAGNOSIS — N189 Chronic kidney disease, unspecified: Secondary | ICD-10-CM | POA: Insufficient documentation

## 2010-12-27 DIAGNOSIS — R05 Cough: Secondary | ICD-10-CM | POA: Insufficient documentation

## 2010-12-27 DIAGNOSIS — I129 Hypertensive chronic kidney disease with stage 1 through stage 4 chronic kidney disease, or unspecified chronic kidney disease: Secondary | ICD-10-CM | POA: Insufficient documentation

## 2010-12-27 DIAGNOSIS — K219 Gastro-esophageal reflux disease without esophagitis: Secondary | ICD-10-CM | POA: Insufficient documentation

## 2010-12-27 DIAGNOSIS — IMO0001 Reserved for inherently not codable concepts without codable children: Secondary | ICD-10-CM | POA: Insufficient documentation

## 2010-12-27 DIAGNOSIS — R6889 Other general symptoms and signs: Secondary | ICD-10-CM | POA: Insufficient documentation

## 2010-12-27 DIAGNOSIS — R509 Fever, unspecified: Secondary | ICD-10-CM | POA: Insufficient documentation

## 2010-12-27 MED ORDER — ACETAMINOPHEN 325 MG PO TABS
975.0000 mg | ORAL_TABLET | Freq: Once | ORAL | Status: AC
  Administered 2010-12-27: 975 mg via ORAL

## 2010-12-27 MED ORDER — ACETAMINOPHEN 325 MG PO TABS
ORAL_TABLET | ORAL | Status: AC
  Filled 2010-12-27: qty 3

## 2010-12-27 MED ORDER — ACETAMINOPHEN 500 MG PO TABS: 1000.0000 mg | ORAL_TABLET | Freq: Once | ORAL | Status: DC

## 2010-12-27 NOTE — ED Notes (Signed)
I took pt's vitals, has a fever. I also got the pt completely undressed except for a gown and a sheet over his pelvic area. I also gave him a cold wet cloth to put over his head. I explained to pt not to put anything else on we got to get his fever down and all those layers he had on was making his fever get higher. I also let RN Sherri know. 10:39 am JG.

## 2010-12-27 NOTE — ED Provider Notes (Signed)
History     CSN: [id number] Arrival date & time: 12/27/2010  7:29 AM   First MD Initiated Contact with Patient 12/27/10 [phone]      Chief Complaint  Patient presents with  . Fever    (Consider location/radiation/quality/duration/timing/severity/associated sxs/prior treatment) HPI Patient presenting with complaint of fever body aches and cough productive of yellow sputum. Symptoms began approximately 3 days ago. He was seen yesterday at urgent care and diagnosed with influenza-like illness. Patient is concerned as he's continuing to have intermittent fevers. He's been taking Tylenol as well as Tylenol with codeine. He's had no vomiting. He continues to drink liquids well. Nothing makes the symptoms better or worse. He's also been taking dull some for cough. Patient is requesting antibiotic. There no other associated systemic symptoms and no significant sick contacts.  Past Medical History  Diagnosis Date  . Hypertension   . GERD (gastroesophageal reflux disease)   . Asthma   . Chronic kidney disease   . Bronchitis   . Migraine     Past Surgical History  Procedure Date  . Hernia repair ?1977    Inguinal  . Cardiac catheterization 2008    Family History  Problem Relation Age of Onset  . Stroke Mother   . Heart disease Mother   . Coronary artery disease Mother   . Heart disease Father   . Hypertension Father   . Heart disease Sister   . Coronary artery disease Sister     History  Substance Use Topics  . Smoking status: Current Everyday Smoker -- 0.5 packs/day for 25 years    Types: Cigarettes  . Smokeless tobacco: Not on file   Comment: States he's cutting back  . Alcohol Use: Yes     1 beer/day      Review of Systems ROS reviewed and otherwise negative except for mentioned in HPI Allergies  Aspirin; Diphenhydramine hcl; and Ibuprofen  Home Medications   Current Outpatient Rx  Name Route Sig Dispense Refill  . ACETAMINOPHEN-CODEINE #3 300-30 MG PO TABS Oral  Take 1-2 tablets by mouth every 6 (six) hours as needed for pain. 15 tablet 0  . ALBUTEROL SULFATE HFA 108 (90 BASE) MCG/ACT IN AERS Inhalation Inhale 2 puffs into the lungs every 4 (four) hours as needed. 3 Inhaler 2  . AMLODIPINE BESYLATE 10 MG PO TABS Oral Take 1 tablet (10 mg total) by mouth daily. 90 tablet 3  . DEXTROMETHORPHAN POLISTIREX ER 30 MG/5ML PO LQCR Oral Take 10 mLs (60 mg total) by mouth as needed for cough. 89 mL 0  . FLUOCINONIDE 0.05 % EX CREA  Apply to affected area twice a day 30 g 1  . HYDROCHLOROTHIAZIDE 25 MG PO TABS Oral Take 1 tablet (25 mg total) by mouth daily. 30 tablet 5  . KLOR-CON M10 PO Oral Take 1 tablet by mouth daily.      Marland Kitchen RANITIDINE HCL 300 MG PO CAPS Oral Take 300 mg by mouth at bedtime.      Marland Kitchen TADALAFIL 10 MG PO TABS  Take 1 pill by mouth 30 minutes before sexual activity       BP 102/68  Pulse 90  Temp(Src) 102.2 F (39 C) (Oral)  Resp 16  Ht 5' 8.5" (1.74 m)  Wt 173 lb (78.472 kg)  BMI 25.92 kg/m2  SpO2 97% Vitals noted Physical Exam Physical Examination: General appearance - alert, well appearing, and in no distress Mental status - alert, oriented to person, place, and time Mouth -  mucous membranes moist, pharynx normal without lesions Neck - supple, no significant adenopathy Chest - clear to auscultation, no wheezes, rales or rhonchi, symmetric air entry Heart - normal rate, regular rhythm, normal S1, S2, no murmurs, rubs, clicks or gallops Musculoskeletal - no joint tenderness, deformity or swelling Extremities - peripheral pulses normal, no pedal edema, no clubbing or cyanosis Skin - normal coloration and turgor, no rashes, no suspicious skin lesions noted  ED Course  Procedures (including critical care time)  Labs Reviewed - No data to display Dg Chest 2 View  12/27/2010  *RADIOLOGY REPORT*  Clinical Data: Cough.  Fever.  CHEST - 2 VIEW  Comparison: 01/05/2007.  Findings: There is no airspace disease or effusion identified.  Cardiopericardial silhouette is within normal limits.  Patient is mildly rotated to the right.  IMPRESSION: No active cardiopulmonary disease.  Original Report Authenticated By: Andreas Newport, M.D.     1. Influenza-like illness       MDM  Patient presenting with fever cough diffuse body aches. Symptoms are most consistent with influenza. Chest x-ray was obtained due to patient being a smoker and continued illness for several days. This did not show any signs of pneumonia. He is taking codeine as well as Delsym for cough. He is taking Tylenol for fever. He has not able to take ibuprofen 2 to reflux and chronic kidney disease. I had a long discussion with the patient about symptomatic treatment and the importance of hydration. He was initially tachycardic but heart rate has decreased upon recheck. He was discharged with strict return precautions and he is agreeable with this plan.        Ethelda Chick, MD 12/27/10 [phone]

## 2010-12-27 NOTE — ED Notes (Signed)
Went to ucc for elevated temp 102.7, and keeps fluctuating. Coughing yellow sputum. Prescribed Tylenol #3. Believes he needs a antibiotic.

## 2010-12-27 NOTE — ED Notes (Signed)
Dr Linker at pt bedside 

## 2010-12-29 ENCOUNTER — Other Ambulatory Visit: Payer: Self-pay | Admitting: *Deleted

## 2010-12-30 MED ORDER — ALBUTEROL SULFATE HFA 108 (90 BASE) MCG/ACT IN AERS: 2.0000 | INHALATION_SPRAY | RESPIRATORY_TRACT | Status: DC | PRN

## 2010-12-31 NOTE — Telephone Encounter (Signed)
Refill was faxed to the [facility].  Angelina Ok, RN 12/31/2010 1:27 PM.

## 2011-01-20 HISTORY — PX: LIVER BIOPSY: SHX301

## 2011-01-27 ENCOUNTER — Ambulatory Visit (HOSPITAL_COMMUNITY)
Admission: RE | Admit: 2011-01-27 | Discharge: 2011-01-27 | Disposition: A | Discharge status: Home / Self Care | Payer: Self-pay | Source: Ambulatory Visit | Attending: Internal Medicine | Admitting: Internal Medicine

## 2011-01-27 ENCOUNTER — Encounter: Payer: Self-pay | Admitting: Internal Medicine

## 2011-01-27 ENCOUNTER — Ambulatory Visit (INDEPENDENT_AMBULATORY_CARE_PROVIDER_SITE_OTHER): Payer: Self-pay | Admitting: Internal Medicine

## 2011-01-27 DIAGNOSIS — R05 Cough: Secondary | ICD-10-CM

## 2011-01-27 DIAGNOSIS — N419 Inflammatory disease of prostate, unspecified: Secondary | ICD-10-CM

## 2011-01-27 DIAGNOSIS — R059 Cough, unspecified: Secondary | ICD-10-CM | POA: Insufficient documentation

## 2011-01-27 DIAGNOSIS — J069 Acute upper respiratory infection, unspecified: Secondary | ICD-10-CM

## 2011-01-27 DIAGNOSIS — R509 Fever, unspecified: Secondary | ICD-10-CM | POA: Insufficient documentation

## 2011-01-27 DIAGNOSIS — I10 Essential (primary) hypertension: Secondary | ICD-10-CM

## 2011-01-27 MED ORDER — ALBUTEROL SULFATE HFA 108 (90 BASE) MCG/ACT IN AERS: 2.0000 | INHALATION_SPRAY | RESPIRATORY_TRACT | Status: DC | PRN

## 2011-01-27 MED ORDER — TADALAFIL 10 MG PO TABS: 10.0000 mg | ORAL_TABLET | Freq: Every day | ORAL | Status: DC | PRN

## 2011-01-27 MED ORDER — AZITHROMYCIN 250 MG PO TABS: ORAL_TABLET | ORAL | Status: AC

## 2011-01-27 MED ORDER — HYDROCHLOROTHIAZIDE 25 MG PO TABS: 25.0000 mg | ORAL_TABLET | Freq: Every day | ORAL | Status: DC

## 2011-01-27 MED ORDER — AMLODIPINE BESYLATE 10 MG PO TABS: 10.0000 mg | ORAL_TABLET | Freq: Every day | ORAL | Status: DC

## 2011-01-27 NOTE — Assessment & Plan Note (Signed)
Likely BPH. Will check a repeat PSA today. Will refer to urology in [place]

## 2011-01-27 NOTE — Assessment & Plan Note (Addendum)
Likely bronchitis. Viral versus bacterial.  Given that he has been getting recurrent episodes and now that his current symptom has lasted for more than one week, will treat as community-acquired pneumonia. -Will get a chest x-ray 2 views -Will also check a CBC and BMP -Start azithromycin 250 mg, 2 tablets x1 day then 1 tablet daily x4 days -Tylenol for fever -Followup in 2 weeks if symptoms do not resolve.  Case discussed with attending, Dr. Coralee Pesa

## 2011-01-27 NOTE — Patient Instructions (Addendum)
Take Azithromycin 250mg  two tablets x 1 day, then take 1 tablet daily x 4 days You can take Tylenol over the counter for fever, do not take more than 4grams per day Continue your other medications  We will refer you to a urologist and they will call you with an appointment Get labs today, I will call you with any abnormal lab results Follow up with Dr. Anselm Jungling in 4-6 months, sooner if your symptoms worsen

## 2011-01-27 NOTE — Progress Notes (Signed)
HPI: Mr. Jerry Terrell is a 51 yo man presents today for recurrent Flu-like symptoms in Oct and Dec and January. He stated that he went to the urgent care and emergency room multiple times for his high fever however he was not given any antibiotics and continued to have fevers. He was told to take Tylenol to bring his fever. His symptoms have been fluctuating in the past several months but did have a few weeks of symptom-free. He states that in the beginning of January, he began to have similar symptoms. He states that he had a fever of 101.9 over the weekend, chills, for more than 1 week with a productive cough that is brown / yellow sputum associated with chest tightness and headache. He also has about 10 pound weight loss in the past several months because he has not been eating as much. He also complained of history of prostate enlargement. He reports have been urine incontinence for many years and started to have bowel incontinence in the past year along with low back pain. He used to followed with Dr. Patrecia Pour but has not been able to see a urologist because of his insurance problem.  He was referred to urologist in [place] last year however his orange card was not active at that time. He denies any blood in stool or urine, dysuria, urethral discharge.   ROS: As per HPI  PE: General: alert, well-developed, and cooperative to examination.  Mouth: pharynx pink and moist, no erythema, and no exudates.  Lungs: normal respiratory effort, no accessory muscle use, normal breath sounds, no crackles, and no wheezes. Heart: normal rate, regular rhythm, no murmur, no gallop, and no rub.  Abdomen: soft, non-tender, normal bowel sounds, no distention, no guarding, no rebound tenderness Msk: no joint swelling, no joint warmth, and no redness over joints.  Pulses: 2+ DP/PT pulses bilaterally Extremities: No cyanosis, clubbing, edema Neurologic: alert & oriented X3, cranial nerves II-XII intact,  strength normal in all extremities, sensation intact to light touch, and gait normal.  Skin: turgor normal and no rashes.  GU: Deferred per patient request Psych: Oriented X3, memory intact for recent and remote, normally interactive, good eye contact, + anxious

## 2011-01-28 LAB — CBC WITH DIFFERENTIAL/PLATELET
Eosinophils Relative: 2 % (ref 0–5)
HCT: 43.8 % (ref 39.0–52.0)
Lymphocytes Relative: 60 % — ABNORMAL HIGH (ref 12–46)
Lymphs Abs: 4.5 10*3/uL — ABNORMAL HIGH (ref 0.7–4.0)
MCV: 92.8 fL (ref 78.0–100.0)
Monocytes Absolute: 0.5 10*3/uL (ref 0.1–1.0)
Platelets: 229 10*3/uL (ref 150–400)
RBC: 4.72 MIL/uL (ref 4.22–5.81)
WBC: 7.4 10*3/uL (ref 4.0–10.5)

## 2011-01-28 LAB — BASIC METABOLIC PANEL WITH GFR
CO2: 21 mEq/L (ref 19–32)
Chloride: 105 mEq/L (ref 96–112)
Creat: 1.26 mg/dL (ref 0.50–1.35)
Potassium: 4.1 mEq/L (ref 3.5–5.3)

## 2011-02-16 ENCOUNTER — Other Ambulatory Visit: Payer: Self-pay | Admitting: Ophthalmology

## 2011-02-16 ENCOUNTER — Other Ambulatory Visit (INDEPENDENT_AMBULATORY_CARE_PROVIDER_SITE_OTHER): Payer: Self-pay

## 2011-02-16 DIAGNOSIS — Z206 Contact with and (suspected) exposure to human immunodeficiency virus [HIV]: Secondary | ICD-10-CM

## 2011-02-16 DIAGNOSIS — Z20828 Contact with and (suspected) exposure to other viral communicable diseases: Secondary | ICD-10-CM

## 2011-02-17 LAB — HEPATITIS PANEL, ACUTE
HCV Ab: REACTIVE — AB
Hep A IgM: NEGATIVE
Hep B C IgM: NEGATIVE

## 2011-02-17 LAB — HIV ANTIBODY (ROUTINE TESTING W REFLEX): HIV: NONREACTIVE

## 2011-02-26 ENCOUNTER — Telehealth: Payer: Self-pay | Admitting: *Deleted

## 2011-02-26 NOTE — Telephone Encounter (Signed)
Pt calls and requests that you call him to discuss the results of his labs, would you like for the front office to make him a f/u appt, you may call him at [phone]  Thanks,h.

## 2011-02-27 ENCOUNTER — Telehealth: Payer: Self-pay | Admitting: Ophthalmology

## 2011-02-27 NOTE — Telephone Encounter (Signed)
Attempted to contact pt at [phone], no answer.   Appt time and date 03/03/11 at 2:15 here in the clinic left on recorder.   Spoke with Dr HO, after she spoke with Dr Ninetta Lights.  Pt's hiv test non-reactive, but to be complete, we will obtain a HIV VL at visit.  Best to bring pt in to explain why we are doing test as he gets very anxious about his health.Jerry Terrell, Darlene Cassady2/8/20135:00 PM

## 2011-02-27 NOTE — Telephone Encounter (Signed)
Attempted to call patient to discuss lab test results. No answer.

## 2011-03-03 ENCOUNTER — Ambulatory Visit (HOSPITAL_COMMUNITY)
Admission: RE | Admit: 2011-03-03 | Discharge: 2011-03-03 | Disposition: A | Discharge status: Home / Self Care | Payer: Self-pay | Source: Ambulatory Visit | Attending: Internal Medicine | Admitting: Internal Medicine

## 2011-03-03 ENCOUNTER — Ambulatory Visit (INDEPENDENT_AMBULATORY_CARE_PROVIDER_SITE_OTHER): Payer: Self-pay | Admitting: Internal Medicine

## 2011-03-03 ENCOUNTER — Encounter: Payer: Self-pay | Admitting: Internal Medicine

## 2011-03-03 ENCOUNTER — Telehealth: Payer: Self-pay | Admitting: Internal Medicine

## 2011-03-03 VITALS — BP 137/79 | HR 76 | Temp 98.4°F | Ht 68.0 in | Wt 168.0 lb

## 2011-03-03 DIAGNOSIS — R768 Other specified abnormal immunological findings in serum: Secondary | ICD-10-CM | POA: Insufficient documentation

## 2011-03-03 DIAGNOSIS — M79609 Pain in unspecified limb: Secondary | ICD-10-CM | POA: Insufficient documentation

## 2011-03-03 DIAGNOSIS — S99921A Unspecified injury of right foot, initial encounter: Secondary | ICD-10-CM

## 2011-03-03 DIAGNOSIS — M79671 Pain in right foot: Secondary | ICD-10-CM

## 2011-03-03 DIAGNOSIS — S99929A Unspecified injury of unspecified foot, initial encounter: Secondary | ICD-10-CM

## 2011-03-03 DIAGNOSIS — X58XXXA Exposure to other specified factors, initial encounter: Secondary | ICD-10-CM

## 2011-03-03 DIAGNOSIS — M7989 Other specified soft tissue disorders: Secondary | ICD-10-CM | POA: Insufficient documentation

## 2011-03-03 DIAGNOSIS — R894 Abnormal immunological findings in specimens from other organs, systems and tissues: Secondary | ICD-10-CM

## 2011-03-03 HISTORY — DX: Other specified abnormal immunological findings in serum: R76.8

## 2011-03-03 LAB — COMPREHENSIVE METABOLIC PANEL
ALT: 34 U/L (ref 0–53)
AST: 25 U/L (ref 0–37)
Alkaline Phosphatase: 95 U/L (ref 39–117)
BUN: 14 mg/dL (ref 6–23)
CO2: 26 mEq/L (ref 19–32)
Calcium: 9.3 mg/dL (ref 8.4–10.5)
Chloride: 105 mEq/L (ref 96–112)
Creat: 1.27 mg/dL (ref 0.50–1.35)
Total Bilirubin: 0.6 mg/dL (ref 0.3–1.2)

## 2011-03-03 MED ORDER — OXYCODONE HCL 5 MG PO TABS: 5.0000 mg | ORAL_TABLET | Freq: Three times a day (TID) | ORAL | Status: DC | PRN

## 2011-03-03 MED ORDER — OXYCODONE HCL 7.5 MG PO TABS: 7.5000 mg | ORAL_TABLET | Freq: Four times a day (QID) | ORAL | Status: DC | PRN

## 2011-03-03 NOTE — Telephone Encounter (Signed)
Called by [facility] about the prescription written today by Dr. Saralyn Pilar for the Oxycodone.  There was a mistake and it was the abuse deterant type.  I spoke with Dr. Saralyn Pilar and she stated that it was an error and she had no concern for abuse potential and that it was okay to give him a prescription for just the regular oxycodone 5 mg tablets take 1-2 every 6 hours as needed for the pain with dispensing 30 tablets

## 2011-03-03 NOTE — Assessment & Plan Note (Signed)
Concerning for possible fracture, although all motions intact.  Right foot xray.  Recommend ice and keep elevated it for first 24 hours  Immobilize in posterior splint if fractured, can consider referral to ortho.  Oxycodone for pain #30 (avoiding tylenol in setting of unknown liver function, and HCV (+)) - pt advised that he has been started on a new medication that can cause drowsiness, do not drive or operate heavy machinery . Do not take this medication with alcohol.

## 2011-03-03 NOTE — Progress Notes (Signed)
Subjective:   Patient ID: Jerry Terrell male   DOB: 1960/10/30 51 y.o.   MRN: [id number]  HPI: [name] is a 51 y.o. with a PMHx of asthma, hypertension, GERD, who presented to clinic today for the following:  1) HCV Antibody positive - patient indicates that his fiance was recently hospitalized, and found out that she is HCV positive. He was therefore instructed to be tested and hepatitis panel and HIV Ab were checked in 01/2011. He wanted to review these results today. We reviewed that results showed HCV antibody positive. He states likely contracted via sexual transmission from his fiance, otherwise, he denies history of heavy alcohol abuse, denies personal or partner's history of IV drug use. In recent years, he has noted yellowing of his eyes, and occasional arthralgias (mostly in his hands), and notes some weight loss of 10 lbs (noted in January). Denies recent easy bruising or bleeding, nausea, abdominal pain, fatigue.  2) Right foot injury - the patient reports dropping a 5 pound weight on the head of his right first metatarsal last night. Since that time, he has had severe, throbbing and sharp pain localized to this region. He has had some redness, warmth, and swelling over this area.  However, he did not sustain any lacerations that he is able to see. He denies change in sensation in his feet, weakness, limited range of motion due to do to this trauma. right foot pain since last night dropped a 5 pound weight throbbing sharp pain   Review of Systems: Per HPI.  Current Outpatient Medications: Medication Sig  . albuterol (VENTOLIN HFA) 108 (90 BASE) MCG/ACT inhaler Inhale 2 puffs into the lungs every 4 (four) hours as needed.  Marland Kitchen amLODipine (NORVASC) 10 MG tablet Take 1 tablet (10 mg total) by mouth daily.  . hydrochlorothiazide (HYDRODIURIL) 25 MG tablet Take 1 tablet (25 mg total) by mouth daily.  . ranitidine (ZANTAC) 300 MG capsule Take 300 mg by mouth at bedtime.    . tadalafil  (CIALIS) 10 MG tablet Take 1 tablet (10 mg total) by mouth daily as needed for erectile dysfunction. Take 1 pill by mouth 30 minutes before sexual activity    Allergies: Allergies  Allergen Reactions  . Aspirin Other (See Comments)    Upset stomach  . Diphenhydramine Hcl     REACTION: itching, "joints and skin crawling" - swelling  . Ibuprofen Other (See Comments)    Upset stomach    Past Medical History  Diagnosis Date  . Hypertension   . GERD (gastroesophageal reflux disease)   . Asthma   . Chronic kidney disease   . Bronchitis   . Migraine     Past Surgical History  Procedure Date  . Hernia repair ?1977    Inguinal  . Cardiac catheterization 2008     Objective:   Physical Exam: Filed Vitals:   03/03/11 1559  BP: 137/79  Pulse: 76  Temp: 98.4 F (36.9 C)     General: Vital signs reviewed and noted. Well-developed, well-nourished, in no acute distress; alert, appropriate and cooperative throughout examination.  Head: Normocephalic, atraumatic.  Eyes: Conjunctivae slightly icteric. PERRL.  Nose: Mucous membranes moist, not inflammed, nonerythematous.  Throat: Oropharynx nonerythematous, no exudate appreciated. Jaundice noted under his tongue.  Neck: No deformities, masses, or tenderness noted.  Lungs:  Normal respiratory effort. Clear to auscultation BL without crackles or wheezes.  Heart: RRR. S1 and S2 normal without gallop, murmur, or rubs.  Abdomen:  BS normoactive. Soft, Nondistended, non-tender.  No masses or organomegaly.  Extremities: No pretibial edema. Right foot - significant tenderness over head of 1st metatarsal, this area is mildly erythematous, swollen.. No skin breaks or lacerations noted. Sensation intact to light touch. Normal range of motion, although did have tenderness with all motions. Muscle strength intact.      Assessment & Plan:  Case and plan of care discussed with Dr. Blanch Media.

## 2011-03-03 NOTE — Patient Instructions (Signed)
   Please follow-up at the clinic in 2 weeks, at which time we will reevaluate your foot pain, hepatitis.  There have been changes in your medications.  START Oxycodone - You have been started on a new medication that can cause drowsiness, do not drive or operate heavy machinery . Do not take this medication with alcohol.    You will be called for a referral to the Liver clinic at [facility].  Please follow-up in the clinic sooner if needed.  If you have been started on new medication(s), and you develop symptoms concerning for allergic reaction, including, but not limited to, throat closing, tongue swelling, rash, please stop the medication immediately and call the clinic at [phone], and go to the ER.  If you are diabetic, please bring your meter to your next visit.  If symptoms worsen, or new symptoms arise, please call the clinic or go to the ER.  Please bring all of your medications in a bag to your next visit.

## 2011-03-03 NOTE — Assessment & Plan Note (Signed)
Patient has a high pretest probability of having HCV, given that his partner is HCV positive, and they have engaged in sexual relations. He denies alcohol abuse, current or former IV drug use. Getting a HCV RNA will likely be of limited utility due to his high likelihood of having the disease. He wants to start tx immediately, and states that his fiance is going to be treated at [facility], which is where he prefers to be seen.  Check HCV RNA quantitative  Check liver function tests  Referral for hepatitis clinic at [facility].

## 2011-03-04 ENCOUNTER — Telehealth: Payer: Self-pay | Admitting: Internal Medicine

## 2011-03-04 MED ORDER — TRAMADOL HCL 50 MG PO TABS: 50.0000 mg | ORAL_TABLET | Freq: Four times a day (QID) | ORAL | Status: DC | PRN

## 2011-03-04 MED ORDER — HYDROXYZINE PAMOATE 25 MG PO CAPS: 25.0000 mg | ORAL_CAPSULE | Freq: Three times a day (TID) | ORAL | Status: AC | PRN

## 2011-03-04 NOTE — Telephone Encounter (Signed)
Patient called stating that he had a new rash with oxycodone, denies any respiratory complaints. I informed him to stop taking it, and if symptoms get worse or if he starts to develop respiratory compliants to go to the ED. Note that pt is allergic to benadryl, therefore cannot take it for itching. Will send script to [facility] with tramadol and vistaril.

## 2011-03-06 LAB — HEPATITIS C RNA QUANTITATIVE: HCV Quantitative: 2013516 IU/mL — ABNORMAL HIGH (ref <43)

## 2011-03-13 ENCOUNTER — Other Ambulatory Visit: Payer: Self-pay | Admitting: *Deleted

## 2011-03-13 MED ORDER — RANITIDINE HCL 300 MG PO CAPS: 300.0000 mg | ORAL_CAPSULE | Freq: Every day | ORAL | Status: DC

## 2011-03-18 ENCOUNTER — Telehealth: Payer: Self-pay | Admitting: *Deleted

## 2011-03-18 NOTE — Telephone Encounter (Signed)
CALLED [facility] OFF[facility] LEFT VOICE MESSAGE FOR OFFICE TO CALL WITH FOLLOW UP INFO. Allyah Heather NTII  12:26PM  [phone]

## 2011-07-16 ENCOUNTER — Ambulatory Visit (INDEPENDENT_AMBULATORY_CARE_PROVIDER_SITE_OTHER): Payer: Self-pay | Admitting: Gastroenterology

## 2011-07-16 DIAGNOSIS — B182 Chronic viral hepatitis C: Secondary | ICD-10-CM

## 2011-07-17 LAB — CBC WITH DIFFERENTIAL/PLATELET
HCT: 43.7 % (ref 39.0–52.0)
Hemoglobin: 15.4 g/dL (ref 13.0–17.0)
Lymphocytes Relative: 51 % — ABNORMAL HIGH (ref 12–46)
Lymphs Abs: 3.6 10*3/uL (ref 0.7–4.0)
Monocytes Absolute: 0.5 10*3/uL (ref 0.1–1.0)
Monocytes Relative: 6 % (ref 3–12)
Neutro Abs: 2.9 10*3/uL (ref 1.7–7.7)
Neutrophils Relative %: 40 % — ABNORMAL LOW (ref 43–77)
RBC: 4.84 MIL/uL (ref 4.22–5.81)
WBC: 7.1 10*3/uL (ref 4.0–10.5)

## 2011-07-17 LAB — COMPLETE METABOLIC PANEL WITH GFR
AST: 26 U/L (ref 0–37)
Albumin: 4.2 g/dL (ref 3.5–5.2)
BUN: 11 mg/dL (ref 6–23)
Calcium: 9.7 mg/dL (ref 8.4–10.5)
Chloride: 105 mEq/L (ref 96–112)
GFR, Est Non African American: 65 mL/min
Glucose, Bld: 86 mg/dL (ref 70–99)
Potassium: 5 mEq/L (ref 3.5–5.3)
Sodium: 140 mEq/L (ref 135–145)
Total Protein: 6.7 g/dL (ref 6.0–8.3)

## 2011-07-17 LAB — TSH: TSH: 1.161 u[IU]/mL (ref 0.350–4.500)

## 2011-07-17 LAB — IBC PANEL
%SAT: 27 % (ref 20–55)
TIBC: 316 ug/dL (ref 215–435)

## 2011-07-17 LAB — ANA: Anti Nuclear Antibody(ANA): NEGATIVE

## 2011-07-17 LAB — IRON: Iron: 85 ug/dL (ref 42–165)

## 2011-07-17 LAB — HEPATITIS B SURFACE ANTIBODY,QUALITATIVE: Hep B S Ab: NEGATIVE

## 2011-07-17 LAB — FERRITIN: Ferritin: 394 ng/mL — ABNORMAL HIGH (ref 22–322)

## 2011-07-24 LAB — HEPATITIS C GENOTYPE

## 2011-07-30 ENCOUNTER — Ambulatory Visit (INDEPENDENT_AMBULATORY_CARE_PROVIDER_SITE_OTHER): Payer: Self-pay | Admitting: Internal Medicine

## 2011-07-30 ENCOUNTER — Encounter: Payer: Self-pay | Admitting: Internal Medicine

## 2011-07-30 VITALS — BP 126/82 | HR 71 | Temp 98.2°F | Ht 68.0 in | Wt 162.6 lb

## 2011-07-30 DIAGNOSIS — N419 Inflammatory disease of prostate, unspecified: Secondary | ICD-10-CM

## 2011-07-30 MED ORDER — CIPROFLOXACIN HCL 500 MG PO TABS: 500.0000 mg | ORAL_TABLET | Freq: Two times a day (BID) | ORAL | Status: AC

## 2011-07-30 NOTE — Assessment & Plan Note (Addendum)
Patient symptoms is most likely due to prostatitis. Prostate digital examination did not find any masses in prostate gland, but patient has severe tenderness over prostate gland. This finding is consistent with a prostatitis. I did not do prostate gland massage due to the concerning of inducing shock.   -Will get GC and Chlamydia probe -Will do urine culture -Will treat him empirically with ciprofloxacin 500 mg twice a day for 6 weeks. Patient will be followed up in 4 weeks. If there's no improvement, we'll give him referral to urologist.

## 2011-07-30 NOTE — Progress Notes (Signed)
Patient ID: Jerry Terrell, male   DOB: 1960-08-30, 51 y.o.   MRN: [id number]  Subjective:   Patient ID: Jerry Terrell male   DOB: 06-24-1960 51 y.o.   MRN: [id number]  HPI: [name] is a 51 y.o. with a past medical history as outlined below, who presents for an acute visit.  Patient reports that he has been having chronic prostatitis for more than 3 years. It  Intermittently relapses in the past. He has been treated in the past with antibiotics one and half years ago (he did not remember the name of antibiotics) with long time remission. Since 3 weeks ago, patient started having similar symptoms as before, including feeling of irritation when he urinates, urinary urgency and frequency. He also noticed a little discharge from his penis which is milk-colored, mucus-like material. There is no odor in the discharge. There is no tenderness over his penis. He does not have a fever, but has chills some times.   Past Medical History  Diagnosis Date  . Hypertension   . GERD (gastroesophageal reflux disease)   . Asthma   . Chronic kidney disease   . Bronchitis   . Migraine    Current Outpatient Prescriptions  Medication Sig Dispense Refill  . albuterol (VENTOLIN HFA) 108 (90 BASE) MCG/ACT inhaler Inhale 2 puffs into the lungs every 4 (four) hours as needed.  3 Inhaler  3  . amLODipine (NORVASC) 10 MG tablet Take 1 tablet (10 mg total) by mouth daily.  90 tablet  3  . hydrochlorothiazide (HYDRODIURIL) 25 MG tablet Take 1 tablet (25 mg total) by mouth daily.  90 tablet  3  . ranitidine (ZANTAC) 300 MG capsule Take 1 capsule (300 mg total) by mouth at bedtime.  30 capsule  6  . tadalafil (CIALIS) 10 MG tablet Take 1 tablet (10 mg total) by mouth daily as needed for erectile dysfunction. Take 1 pill by mouth 30 minutes before sexual activity  10 tablet  3  . traMADol (ULTRAM) 50 MG tablet Take 1 tablet (50 mg total) by mouth every 6 (six) hours as needed for pain.  30 tablet  0   Family History    Problem Relation Age of Onset  . Stroke Mother   . Heart disease Mother   . Coronary artery disease Mother   . Heart disease Father   . Hypertension Father   . Heart disease Sister   . Coronary artery disease Sister    History   Social History  . Marital Status: Single    Spouse Name: N/A    Number of Children: 2  . Years of Education: N/A   Occupational History  .      unemployed   Social History Main Topics  . Smoking status: Current Everyday Smoker -- 0.5 packs/day for 25 years    Types: Cigarettes  . Smokeless tobacco: None   Comment: States he's cutting back  . Alcohol Use: Yes     1 beer/day  . Drug Use: No  . Sexually Active: None   Other Topics Concern  . None   Social History Narrative   Financial assistance approved for 100% discount at [facility] and has [facility] card per [employer] August 23,2011 9:43AM   Review of Systems: General: no fevers, but has chills, no changes in body weight, no changes in appetite Skin: no rash HEENT: no blurry vision, hearing changes or sore throat Pulm: no dyspnea, coughing, wheezing CV: no chest pain, palpitations, shortness of breath Abd:  no nausea/vomiting, abdominal pain, diarrhea/constipation GU: Has urinary urgency and frequency UG: penile discharge Ext: no arthralgias, myalgias Neuro: no weakness, numbness, or tingling   Objective:  Physical Exam: Filed Vitals:   07/30/11 0947  BP: 126/82  Pulse: 71  Temp: 98.2 F (36.8 C)  TempSrc: Oral  Height: 5\' 8"  (1.727 m)  Weight: 162 lb 9.6 oz (73.755 kg)  SpO2: 100%    General: resting in bed, not in acute distress HEENT: PERRL, EOMI, no scleral icterus Cardiac: S1/S2, RRR, No murmurs, gallops or rubs Pulm: Good air movement bilaterally, Clear to auscultation bilaterally, No rales, wheezing, rhonchi or rubs. Abd: Soft,  nondistended, nontender, no rebound pain, no organomegaly, BS present Prostate digital exam: there is no mass. There is tenderness over prostate  gland. Ext: No rashes or edema, 2+DP/PT pulse bilaterally Musculoskeletal: No joint deformities, erythema, or stiffness, ROM full and no nontender Skin: no rashes. No skin bruise. Neuro: alert and oriented X3, cranial nerves II-XII grossly intact, muscle strength 5/5 in all extremeties,  sensation to light touch intact.  Psych.: patient is not psychotic, no suicidal or hemocidal ideation.     Assessment & Plan:

## 2011-07-30 NOTE — Patient Instructions (Signed)
1. Please start taking Ciprofloxacin from now. 2. Please take all medications as prescribed.  3. If you have worsening of your symptoms or new symptoms arise, please call the clinic ([phone]), or go to the ER immediately if symptoms are severe.

## 2011-08-06 NOTE — Progress Notes (Signed)
NAMEMarland Kitchen  Jerry Terrell, Jerry Terrell    MR#:  [id number]      DATE:  07/16/2011  DOB:  [birth year]    cc: Primary Care Physician: Same Referring Physician: Judie Petit. Johnette Abraham, DO, [facility]    REASON FOR REFERRAL:  Positive HCV RNA.   HISTORY:  The patient is a 51 year old gentleman who I have been asked to see in consultation by Dr. Saralyn Pilar regarding a positive HCV RNA.   According to the patient today and some of the notes I reviewed, he was first diagnosed with hepatitis C approximately 7-8 years ago, but he cannot recall there being any followup. He relates some confusion over whether he was positive for hepatitis B or C at the time. I have no independent documentation of this. More recently, it appears that on testing, with his primary physician on 02/16/2011, he tested  hepatitis C antibody positive and hepatitis B surface antigen negative. Incidentally, he was also human immunodeficiency virus (HIV) disease negative. Subsequently, on 03/03/2011, his viral load was 2,013,516 international units per mL. He was not genotyped. Although the patient has a number complaints, these are not directly referable to his history of hepatitis C nor are there symptoms to suggest cryoglobulin mediated or decompensated liver disease.   With respect to risk factors for liver disease, he consumes approximately 3 beers per week and an occasional glass a wine, but no liquor in many years and has no history of DWIs. Over 20 years ago, he used marijuana. He has tattoos. There is a history of sterile body piercing. There is no history of blood transfusions prior to 1992. In terms of family history he thinks his father may have had liver disease. He has not been vaccinated against hepatitis A or B.   PAST MEDICAL HISTORY:  Significant for hypertension, reflux disease, migraine headaches. He also carries a diagnosis of asthma. He reports the he has received a course of steroids in the past but in the  last few years, he had no further therapy other than inhalers. He reports undergoing a heart catheterization approximately 3-4 years ago. I have records of an echocardiogram from 01/06/2007, done for chest pain. His ejection fraction was 65% and his right ventricle was normal in size and function, a peripheral vascular study on 10/01/2009, was negative for any significant peripheral vascular disease. An exercise tolerance test, on 10/01/2009, did not show any evidence of ST-segment changes to suggest ischemia. He had no symptoms to suggest ischemia either. I gather, his heart rate was 144 beats per minute with his target being 169.  PAST SURGICAL HISTORY:  Actually the inguinal herniorrhaphy 90, possibly 1977.   PAST PSYCHIATRIC HISTORY:  Denies.   CURRENT MEDICATIONS:  1. Norvasc 10 mg daily.  2. Ranitidine 300 mg at bedtime.  3. Hydrochlorothiazide 25 mg daily.  4. Proventil inhaler 2 inhalations q.i.d.   ALLERGIES:  He reports that Benadryl causes him to have restlessness in his legs and OxyContin most causes pruritus and makes him feel "crazy."   HABITS:  Smoking half pack of cigarettes per day. Alcohol as above.   FAMILY HISTORY:  As above.   SOCIAL HISTORY:  Divorced with 2 children. 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   REVIEW OF SYSTEMS:  All 10 systems reviewed today on the review of systems form, which was signed and placed in the chart. CES-D was 22.   physical exAMINATION:  Constitutional: Well-appearing without stigmata of chronic  liver disease. Vital signs: Height 68.5 inches, weight 162 pounds, blood pressure 160/97, pulse 67, temperature 99.3 Fahrenheit. Ears, Nose, Mouth and Throat:  Unremarkable oropharynx.  No thyromegaly or neck masses.  Chest:  Resonant to percussion.  Clear to auscultation.  Cardiovascular:  Heart sounds normal S1, S2 without murmurs or rubs.  There is no peripheral edema.  Abdomen:  Normal bowel  sounds.  No masses or tenderness.  I could not appreciate a liver edge or spleen tip.  I could not appreciate any hernias.  Lymphatics:  No cervical or inguinal lymphadenopathy.  Central Nervous System:  No asterixis or focal neurologic findings.  Dermatologic:  Anicteric without palmar erythema or spider angiomata.  Eyes:  Anicteric sclerae.  Pupils are equal and reactive to light.  LABORATORIES:  Previous labs were reviewed within EPIC. His most recent lab testing on 03/03/2011, was reviewed, which include an AST of 25, ALT 34, ALP 95, total bilirubin 0.6, albumin was 4.2 with a creatinine 1.27. His viral load was 2,013,516 international units per mL, 6.3 log international units per mL.   The most recent imaging of the liver was an ultrasound of the abdomen on 02/27/2008, which was unremarkable.   On 02/16/2011, his HIV was nonreactive. There was an HIV that was reactive on 08/02/2007, but I gather that was a false positive because there has been 3 times since that time that they remainednegative.   ASSESSMENT:  The patient is a 51 year old gentleman with history of a positive HCV RNA. He appears to be clinically and biochemically well compensated. His symptoms, that he complained about today, are not related to his liver disease. I do not see any overt contraindication to treating him for his hepatitis C. He will need to be genotyped and possibly a biopsy depending on his genotype.   In my discussion today with the patient, I discussed the nature and natural history of hepatitis C. We discussed the significance of genotyping. We discussed the role of a liver biopsy for genotype 1 although I can see that we may not be biopsy as many people in the future. We discussed treatment of hepatitis C with peginterferon, ribavirin for all genotypes and the addition of the direct acting antiviral for genotype 1.  I reviewed the response rates. I reviewed the treatment protocol for our clinic. I reviewed the  specific system, constitutional, and psychiatric side effects of therapy. I also explained to him that a lot of therapy is currently being deferred in anticipation of the newest agents that may be available by late 2013 to early 2014, that will shorten duration of therapy and improve the tolerability and response rates. We also discussed the risk of contagion and particularly because he believes his girlfriend is the source of his HCV. I explained to him this is exceedingly rare to transmit hepatitis C, so it is unlikely was that he acquired it from her.   The patient had a number of complaints today that are not referable to his history of hepatitis C, although he believes they may be. It is likely that he somatosizes significantly, which could pose some difficulty when he experiences side effects of therapy for hepatitis C.    PLAN:  1. Standard labs.  2. Genotype.  3. Test for hepatitis A and B immunity.  4. If genotype 1, will proceed to liver biopsy and follow up thereafter.  5. Genotype 2-3, will proceed directly to followup.  6. Because of his insistence, I will see him again  in 3 months' time in followup after all the lab testing and perhaps a liver biopsy is done.  7. Literature on hepatitis C was given.              Brooke Dare, MD   ADDENDUM Genotype 1b.  Will ask for a biopsy.  Hep A nave.  Hep B Core Ab pos.  Hep B surf Ab negative.  403 .[id number]  D:  Thu Jun 27 19:22:09 2013 ; T:  Thu Jun 27 23:33:11 2013  Job #:  [id number]

## 2011-08-18 ENCOUNTER — Other Ambulatory Visit: Payer: Self-pay | Admitting: Radiology

## 2011-08-20 ENCOUNTER — Encounter (HOSPITAL_COMMUNITY): Payer: Self-pay

## 2011-08-20 ENCOUNTER — Ambulatory Visit (HOSPITAL_COMMUNITY)
Admission: RE | Admit: 2011-08-20 | Discharge: 2011-08-20 | Disposition: A | Discharge status: Home / Self Care | Payer: Self-pay | Source: Ambulatory Visit | Attending: Gastroenterology | Admitting: Gastroenterology

## 2011-08-20 VITALS — BP 112/66 | HR 62 | Temp 96.7°F | Resp 20 | Ht 68.5 in | Wt 172.0 lb

## 2011-08-20 DIAGNOSIS — G43909 Migraine, unspecified, not intractable, without status migrainosus: Secondary | ICD-10-CM | POA: Insufficient documentation

## 2011-08-20 DIAGNOSIS — N189 Chronic kidney disease, unspecified: Secondary | ICD-10-CM | POA: Insufficient documentation

## 2011-08-20 DIAGNOSIS — J45909 Unspecified asthma, uncomplicated: Secondary | ICD-10-CM | POA: Insufficient documentation

## 2011-08-20 DIAGNOSIS — F430 Acute stress reaction: Secondary | ICD-10-CM | POA: Insufficient documentation

## 2011-08-20 DIAGNOSIS — I129 Hypertensive chronic kidney disease with stage 1 through stage 4 chronic kidney disease, or unspecified chronic kidney disease: Secondary | ICD-10-CM | POA: Insufficient documentation

## 2011-08-20 DIAGNOSIS — B182 Chronic viral hepatitis C: Secondary | ICD-10-CM | POA: Insufficient documentation

## 2011-08-20 LAB — CBC
HCT: 47.9 % (ref 39.0–52.0)
Hemoglobin: 17 g/dL (ref 13.0–17.0)
MCH: 32.3 pg (ref 26.0–34.0)
MCHC: 35.5 g/dL (ref 30.0–36.0)
MCV: 91.1 fL (ref 78.0–100.0)
Platelets: 267 K/uL (ref 150–400)
RBC: 5.26 MIL/uL (ref 4.22–5.81)
RDW: 13.2 % (ref 11.5–15.5)
WBC: 7.6 K/uL (ref 4.0–10.5)

## 2011-08-20 LAB — APTT: aPTT: 33 s (ref 24–37)

## 2011-08-20 LAB — PROTIME-INR
INR: 0.94 (ref 0.00–1.49)
Prothrombin Time: 12.8 seconds (ref 11.6–15.2)

## 2011-08-20 MED ORDER — FENTANYL CITRATE 0.05 MG/ML IJ SOLN
INTRAMUSCULAR | Status: DC | PRN
  Administered 2011-08-20: 50 ug via INTRAVENOUS
  Administered 2011-08-20: 25 ug via INTRAVENOUS

## 2011-08-20 MED ORDER — MIDAZOLAM HCL 2 MG/2ML IJ SOLN
INTRAMUSCULAR | Status: AC
  Filled 2011-08-20: qty 4

## 2011-08-20 MED ORDER — SODIUM CHLORIDE 0.9 % IV SOLN: Freq: Once | INTRAVENOUS | Status: DC

## 2011-08-20 MED ORDER — FENTANYL CITRATE 0.05 MG/ML IJ SOLN
INTRAMUSCULAR | Status: AC
  Filled 2011-08-20: qty 4

## 2011-08-20 MED ORDER — HYDROCODONE-ACETAMINOPHEN 5-325 MG PO TABS
1.0000 | ORAL_TABLET | ORAL | Status: DC | PRN
  Administered 2011-08-20: 2 via ORAL
  Filled 2011-08-20: qty 2

## 2011-08-20 MED ORDER — MIDAZOLAM HCL 5 MG/5ML IJ SOLN
INTRAMUSCULAR | Status: DC | PRN
  Administered 2011-08-20 (×2): 1 mg via INTRAVENOUS

## 2011-08-20 NOTE — H&P (Signed)
Chief Complaint: "I'm here for liver biopsy" Referring Physician:Zacks HPI: Jerry Terrell is an 51 y.o. male with recent dx of Hepatitis C and is referred for liver biopsy. No other c/o  Past Medical History:  Past Medical History  Diagnosis Date  . Hypertension   . GERD (gastroesophageal reflux disease)   . Asthma   . Chronic kidney disease   . Bronchitis   . Migraine     Past Surgical History:  Past Surgical History  Procedure Date  . Hernia repair ?1977    Inguinal  . Cardiac catheterization 2008    Family History:  Family History  Problem Relation Age of Onset  . Stroke Mother   . Heart disease Mother   . Coronary artery disease Mother   . Heart disease Father   . Hypertension Father   . Heart disease Sister   . Coronary artery disease Sister     Social History:  reports that he has been smoking Cigarettes.  He has a 12.5 pack-year smoking history. He does not have any smokeless tobacco history on file. He reports that he drinks alcohol. He reports that he does not use illicit drugs.  Allergies:  Allergies  Allergen Reactions  . Oxycodone Itching and Rash  . Aspirin Other (See Comments)    Upset stomach  . Diphenhydramine Hcl     REACTION: itching, "joints and skin crawling" - swelling  . Ibuprofen Other (See Comments)    Upset stomach  . Oxycontin (Oxycodone Hcl Er) Itching    Medications: Ventolin HFA Norvasc Hydrodiuril Zantac Cialis  Please HPI for pertinent positives, otherwise complete 10 system ROS negative.  Physical Exam: Blood pressure 131/84, pulse 70, temperature 98.6 F (37 C), temperature source Oral, resp. rate 20, height 5' 8.5" (1.74 m), weight 172 lb (78.019 kg), SpO2 99.00%. Body mass index is 25.77 kg/(m^2).   General Appearance:  Alert, cooperative, no distress, appears stated age.  Head:  Normocephalic, without obvious abnormality, atraumatic  ENT: Unremarkable  Neck: Supple, symmetrical, trachea midline, no adenopathy,  thyroid: not enlarged, symmetric, no tenderness/mass/nodules  Lungs:   Clear to auscultation bilaterally, no w/r/r, respirations unlabored without use of accessory muscles.  Heart:  Regular rate and rhythm, S1, S2 normal, no murmur, rub or gallop. Carotids 2+ without bruit.  Abdomen:   Soft, non-tender, non distended. Bowel sounds active all four quadrants,  no masses, no organomegaly.  Extremities: Extremities normal, atraumatic, no cyanosis or edema  Neurologic: Normal affect, no gross deficits.   Results for orders placed during the hospital encounter of 08/20/11 (from the past 48 hour(s))  APTT     Status: Normal   Collection Time   08/20/11  8:39 AM      Component Value Range Comment   aPTT 33  24 - 37 seconds   CBC     Status: Normal   Collection Time   08/20/11  8:39 AM      Component Value Range Comment   WBC 7.6  4.0 - 10.5 K/uL    RBC 5.26  4.22 - 5.81 MIL/uL    Hemoglobin 17.0  13.0 - 17.0 g/dL    HCT 40.9  81.1 - 91.4 %    MCV 91.1  78.0 - 100.0 fL    MCH 32.3  26.0 - 34.0 pg    MCHC 35.5  30.0 - 36.0 g/dL    RDW 78.2  95.6 - 21.3 %    Platelets 267  150 - 400 K/uL   PROTIME-INR  Status: Normal   Collection Time   08/20/11  8:39 AM      Component Value Range Comment   Prothrombin Time 12.8  11.6 - 15.2 seconds    INR 0.94  0.00 - 1.49    No results found.  Assessment/Plan Hepatitis C For random US guided liver biopsy. Labs ok. Discussed procedure and risks. Consent signed in chart  Brayton El PA-C 08/20/2011, 11:34 AM

## 2011-08-20 NOTE — Procedures (Signed)
Successful ultrasound guided liver biopsy.  Three core biopsies obtained and no immediate complication.

## 2011-08-21 ENCOUNTER — Encounter (HOSPITAL_COMMUNITY): Payer: Self-pay | Admitting: Emergency Medicine

## 2011-08-21 DIAGNOSIS — K862 Cyst of pancreas: Secondary | ICD-10-CM | POA: Insufficient documentation

## 2011-08-21 DIAGNOSIS — R109 Unspecified abdominal pain: Secondary | ICD-10-CM | POA: Insufficient documentation

## 2011-08-21 DIAGNOSIS — R141 Gas pain: Secondary | ICD-10-CM | POA: Insufficient documentation

## 2011-08-21 DIAGNOSIS — R10819 Abdominal tenderness, unspecified site: Secondary | ICD-10-CM | POA: Insufficient documentation

## 2011-08-21 DIAGNOSIS — Z9889 Other specified postprocedural states: Secondary | ICD-10-CM | POA: Insufficient documentation

## 2011-08-21 DIAGNOSIS — Z8619 Personal history of other infectious and parasitic diseases: Secondary | ICD-10-CM | POA: Insufficient documentation

## 2011-08-21 DIAGNOSIS — R142 Eructation: Secondary | ICD-10-CM | POA: Insufficient documentation

## 2011-08-21 DIAGNOSIS — I10 Essential (primary) hypertension: Secondary | ICD-10-CM | POA: Insufficient documentation

## 2011-08-21 LAB — COMPREHENSIVE METABOLIC PANEL
Albumin: 3.7 g/dL (ref 3.5–5.2)
BUN: 15 mg/dL (ref 6–23)
Chloride: 100 mEq/L (ref 96–112)
Creatinine, Ser: 1.1 mg/dL (ref 0.50–1.35)
Total Bilirubin: 0.5 mg/dL (ref 0.3–1.2)
Total Protein: 7.4 g/dL (ref 6.0–8.3)

## 2011-08-21 LAB — CBC WITH DIFFERENTIAL/PLATELET
Basophils Relative: 1 % (ref 0–1)
Eosinophils Absolute: 0.2 10*3/uL (ref 0.0–0.7)
Eosinophils Relative: 3 % (ref 0–5)
HCT: 44.4 % (ref 39.0–52.0)
Hemoglobin: 15.9 g/dL (ref 13.0–17.0)
MCH: 32.4 pg (ref 26.0–34.0)
MCHC: 35.8 g/dL (ref 30.0–36.0)
MCV: 90.6 fL (ref 78.0–100.0)
Monocytes Absolute: 0.5 10*3/uL (ref 0.1–1.0)
Monocytes Relative: 7 % (ref 3–12)

## 2011-08-21 NOTE — ED Notes (Signed)
Pt reports having liver biopsy yesterday, started having R abd pain and lower abd pain; reports temp of 99.1 at home and nausea

## 2011-08-22 ENCOUNTER — Encounter (HOSPITAL_COMMUNITY): Payer: Self-pay | Admitting: Radiology

## 2011-08-22 ENCOUNTER — Emergency Department (HOSPITAL_COMMUNITY)
Admission: EM | Admit: 2011-08-22 | Discharge: 2011-08-22 | Disposition: A | Discharge status: Home / Self Care | Payer: Self-pay | Attending: Emergency Medicine | Admitting: Emergency Medicine

## 2011-08-22 ENCOUNTER — Emergency Department (HOSPITAL_COMMUNITY): Payer: Self-pay

## 2011-08-22 DIAGNOSIS — R109 Unspecified abdominal pain: Secondary | ICD-10-CM

## 2011-08-22 DIAGNOSIS — K862 Cyst of pancreas: Secondary | ICD-10-CM

## 2011-08-22 LAB — URINALYSIS, ROUTINE W REFLEX MICROSCOPIC
Glucose, UA: NEGATIVE mg/dL
Protein, ur: 30 mg/dL — AB

## 2011-08-22 LAB — URINE MICROSCOPIC-ADD ON

## 2011-08-22 MED ORDER — MORPHINE SULFATE 4 MG/ML IJ SOLN
4.0000 mg | Freq: Once | INTRAMUSCULAR | Status: AC
  Administered 2011-08-22: 4 mg via INTRAVENOUS
  Filled 2011-08-22: qty 1

## 2011-08-22 MED ORDER — IOHEXOL 300 MG/ML  SOLN
20.0000 mL | INTRAMUSCULAR | Status: DC
  Administered 2011-08-22: 20 mL via ORAL

## 2011-08-22 MED ORDER — IOHEXOL 300 MG/ML  SOLN
100.0000 mL | Freq: Once | INTRAMUSCULAR | Status: AC | PRN
  Administered 2011-08-22: 100 mL via INTRAVENOUS

## 2011-08-22 MED ORDER — HYDROCODONE-ACETAMINOPHEN 5-325 MG PO TABS: 1.0000 | ORAL_TABLET | ORAL | Status: DC | PRN

## 2011-08-22 MED ORDER — ONDANSETRON HCL 4 MG/2ML IJ SOLN
4.0000 mg | Freq: Once | INTRAMUSCULAR | Status: AC
  Administered 2011-08-22: 4 mg via INTRAVENOUS
  Filled 2011-08-22: qty 2

## 2011-08-22 NOTE — ED Provider Notes (Signed)
History     CSN: [id number]  Arrival date & time 08/21/11  2129   First MD Initiated Contact with Patient 08/22/11 0110      Chief Complaint  Patient presents with  . Abdominal Pain   HPI  History provided by the patient. Patient is a 51 year old male with history of hypertension, asthma, chronic kidney disease and history of recent liver biopsy performed yesterday who presents with complaints of right sided abdominal pains. Pain has been increasing over the past day. Patient also feels some subjective distention of abdomen. He reports having some low-grade fevers at home. Patient was not prescribed any pain medications following his liver biopsy procedure. He did take some Tylenol without significant relief of symptoms. Patient called his GI specialist who recommended coming to the emergency room for further evaluation to be sure there was no excessive bleeding or other concerning condition following the biopsy. Patient denies having any nausea vomiting diarrhea constipation symptoms.     Past Medical History  Diagnosis Date  . Hypertension   . GERD (gastroesophageal reflux disease)   . Asthma   . Chronic kidney disease   . Bronchitis   . Migraine     Past Surgical History  Procedure Date  . Hernia repair ?1977    Inguinal  . Cardiac catheterization 2008    Family History  Problem Relation Age of Onset  . Stroke Mother   . Heart disease Mother   . Coronary artery disease Mother   . Heart disease Father   . Hypertension Father   . Heart disease Sister   . Coronary artery disease Sister     History  Substance Use Topics  . Smoking status: Current Everyday Smoker -- 1.0 packs/day for 25 years    Types: Cigarettes  . Smokeless tobacco: Not on file   Comment: States he's cutting back  . Alcohol Use: Yes     1 beer/day      Review of Systems  Constitutional: Negative for fever and chills.  Respiratory: Negative for shortness of breath.   Cardiovascular:  Negative for chest pain.  Gastrointestinal: Positive for abdominal pain and abdominal distention. Negative for nausea, vomiting, diarrhea and constipation.  Genitourinary: Negative for flank pain.    Allergies  Oxycodone; Aspirin; Diphenhydramine hcl; Ibuprofen; and Oxycontin  Home Medications   Current Outpatient Rx  Name Route Sig Dispense Refill  . ACETAMINOPHEN 325 MG PO TABS Oral Take 650 mg by mouth every 6 (six) hours as needed. For pain    . ALBUTEROL SULFATE HFA 108 (90 BASE) MCG/ACT IN AERS Inhalation Inhale 2 puffs into the lungs every 4 (four) hours as needed. For shortness of breath    . AMLODIPINE BESYLATE 10 MG PO TABS Oral Take 1 tablet (10 mg total) by mouth daily. 90 tablet 3  . HYDROCHLOROTHIAZIDE 25 MG PO TABS Oral Take 1 tablet (25 mg total) by mouth daily. 90 tablet 3  . RANITIDINE HCL 300 MG PO CAPS Oral Take 1 capsule (300 mg total) by mouth at bedtime. 30 capsule 6    BP 127/73  Temp 98.7 F (37.1 C) (Oral)  Resp 18  SpO2 100%  Physical Exam  Nursing note and vitals reviewed. Constitutional: He is oriented to person, place, and time. He appears well-developed and well-nourished. No distress.  HENT:  Head: Normocephalic.  Cardiovascular: Normal rate and regular rhythm.   Pulmonary/Chest: Effort normal and breath sounds normal.  Abdominal: Soft. He exhibits no distension. There is tenderness. There is  guarding.       Epigastric and right upper quadrant tenderness. There is also tenderness around a small puncture site to right lateral abdomen and chest wall. Skin is normal without erythema or induration. No bleeding or drainage from biopsy site.  Neurological: He is alert and oriented to person, place, and time.  Skin: Skin is warm.  Psychiatric: He has a normal mood and affect. His behavior is normal.    ED Course  Procedures   Results for orders placed during the hospital encounter of 08/22/11  CBC WITH DIFFERENTIAL      Component Value Range   WBC  7.9  4.0 - 10.5 K/uL   RBC 4.90  4.22 - 5.81 MIL/uL   Hemoglobin 15.9  13.0 - 17.0 g/dL   HCT 21.3  08.6 - 57.8 %   MCV 90.6  78.0 - 100.0 fL   MCH 32.4  26.0 - 34.0 pg   MCHC 35.8  30.0 - 36.0 g/dL   RDW 46.9  62.9 - 52.8 %   Platelets 264  150 - 400 K/uL   Neutrophils Relative 34 (*) 43 - 77 %   Neutro Abs 2.7  1.7 - 7.7 K/uL   Lymphocytes Relative 56 (*) 12 - 46 %   Lymphs Abs 4.4 (*) 0.7 - 4.0 K/uL   Monocytes Relative 7  3 - 12 %   Monocytes Absolute 0.5  0.1 - 1.0 K/uL   Eosinophils Relative 3  0 - 5 %   Eosinophils Absolute 0.2  0.0 - 0.7 K/uL   Basophils Relative 1  0 - 1 %   Basophils Absolute 0.0  0.0 - 0.1 K/uL  COMPREHENSIVE METABOLIC PANEL      Component Value Range   Sodium 137  135 - 145 mEq/L   Potassium 4.4  3.5 - 5.1 mEq/L   Chloride 100  96 - 112 mEq/L   CO2 27  19 - 32 mEq/L   Glucose, Bld 124 (*) 70 - 99 mg/dL   BUN 15  6 - 23 mg/dL   Creatinine, Ser 4.13  0.50 - 1.35 mg/dL   Calcium 9.4  8.4 - 24.4 mg/dL   Total Protein 7.4  6.0 - 8.3 g/dL   Albumin 3.7  3.5 - 5.2 g/dL   AST 28  0 - 37 U/L   ALT 33  0 - 53 U/L   Alkaline Phosphatase 100  39 - 117 U/L   Total Bilirubin 0.5  0.3 - 1.2 mg/dL   GFR calc non Af Amer 75 (*) >90 mL/min   GFR calc Af Amer 87 (*) >90 mL/min  URINALYSIS, ROUTINE W REFLEX MICROSCOPIC      Component Value Range   Color, Urine YELLOW  YELLOW   APPearance CLEAR  CLEAR   Specific Gravity, Urine 1.025  1.005 - 1.030   pH 5.5  5.0 - 8.0   Glucose, UA NEGATIVE  NEGATIVE mg/dL   Hgb urine dipstick TRACE (*) NEGATIVE   Bilirubin Urine NEGATIVE  NEGATIVE   Ketones, ur NEGATIVE  NEGATIVE mg/dL   Protein, ur 30 (*) NEGATIVE mg/dL   Urobilinogen, UA 0.2  0.0 - 1.0 mg/dL   Nitrite NEGATIVE  NEGATIVE   Leukocytes, UA TRACE (*) NEGATIVE  URINE MICROSCOPIC-ADD ON      Component Value Range   Squamous Epithelial / LPF RARE  RARE   WBC, UA 3-6  <3 WBC/hpf   RBC / HPF 3-6  <3 RBC/hpf   Bacteria, UA RARE  RARE       US  Biopsy  08/20/2011  *RADIOLOGY REPORT*  Clinical:  Hepatitis C.  ULTRASOUND GUIDED CORE LIVER BIOPSY  Sedation:  2 mg IV Versed; 75 mcg IV Fentanyl  Total Moderate Sedation Time:  18 minutes.  An ultrasound guided liver biopsy was thoroughly discussed with the patient and questions were answered.  The benefits, risks, alternatives, and complications were also discussed.  The patient understands and wishes to proceed with the procedure.  Written consent was obtained.  Ultrasound of the liver was performed and an appropriate skin entry site was determined.  Skin site was marked, prepped with Betadine, and draped in the usual sterile fashion.  Local anesthesia was provided with 1% Lidocaine.  A 17 gauge trocar needle was advanced under ultrasound guidance into the right hepatic colon A total of three coaxial 18 gauge core samples were then obtained through the guide needle. The guide needle was removed. Post procedure scans were obtained.  Complications:  None  Findings:  Needle placed within the right hepatic lobe.  IMPRESSION: Successful ultrasound guided random core biopsy of the liver.  Original Report Authenticated By: Richarda Overlie, M.D.     1. Abdominal pain   2. Cyst of pancreas       MDM  Patient seen and evaluated. Patient with history of recent liver biopsy. Will obtain CT of abdomen to rule out any hemoperitoneum or large bleed.   Patient feeling much better after medications. CT scan is without any concerning findings post liver biopsy. There is signs of a small cyst the pancreas. This was discussed with patient. Patient will followup with PCP and specialist.     Angus Seller, PA 08/23/11 [phone]

## 2011-08-22 NOTE — ED Notes (Signed)
Liver biopsy done on 08/20/11, yesterday began having shooting, sharp pain on the right upper abdomen over biopsy site that radiates to epigastric area. Today the pain is worse and movement makes it worse, asociated with a fever of 100.1 and headache.

## 2011-08-23 LAB — URINE CULTURE
Colony Count: NO GROWTH
Culture: NO GROWTH

## 2011-08-23 NOTE — ED Provider Notes (Signed)
Medical screening examination/treatment/procedure(s) were performed by non-physician practitioner and as supervising physician I was immediately available for consultation/collaboration.    Vida Roller, MD 08/23/11 [phone]

## 2011-08-24 ENCOUNTER — Encounter: Payer: Self-pay | Admitting: Internal Medicine

## 2011-08-24 ENCOUNTER — Ambulatory Visit (INDEPENDENT_AMBULATORY_CARE_PROVIDER_SITE_OTHER): Payer: Self-pay | Admitting: Internal Medicine

## 2011-08-24 VITALS — BP 131/81 | HR 78 | Temp 98.9°F | Wt 164.9 lb

## 2011-08-24 DIAGNOSIS — I10 Essential (primary) hypertension: Secondary | ICD-10-CM

## 2011-08-24 DIAGNOSIS — K219 Gastro-esophageal reflux disease without esophagitis: Secondary | ICD-10-CM

## 2011-08-24 DIAGNOSIS — R1011 Right upper quadrant pain: Secondary | ICD-10-CM | POA: Insufficient documentation

## 2011-08-24 DIAGNOSIS — R109 Unspecified abdominal pain: Secondary | ICD-10-CM

## 2011-08-24 DIAGNOSIS — K862 Cyst of pancreas: Secondary | ICD-10-CM | POA: Insufficient documentation

## 2011-08-24 DIAGNOSIS — F17200 Nicotine dependence, unspecified, uncomplicated: Secondary | ICD-10-CM

## 2011-08-24 DIAGNOSIS — N411 Chronic prostatitis: Secondary | ICD-10-CM

## 2011-08-24 LAB — CBC WITH DIFFERENTIAL/PLATELET
Basophils Relative: 0 % (ref 0–1)
Hemoglobin: 16.4 g/dL (ref 13.0–17.0)
Lymphs Abs: 4.2 10*3/uL — ABNORMAL HIGH (ref 0.7–4.0)
Monocytes Relative: 8 % (ref 3–12)
Neutro Abs: 2.3 10*3/uL (ref 1.7–7.7)
Neutrophils Relative %: 31 % — ABNORMAL LOW (ref 43–77)
Platelets: 268 10*3/uL (ref 150–400)
RBC: 5.21 MIL/uL (ref 4.22–5.81)

## 2011-08-24 MED ORDER — CIPROFLOXACIN HCL 500 MG PO TABS: 500.0000 mg | ORAL_TABLET | Freq: Two times a day (BID) | ORAL | Status: AC

## 2011-08-24 MED ORDER — HYDROCODONE-ACETAMINOPHEN 5-325 MG PO TABS: 1.0000 | ORAL_TABLET | ORAL | Status: AC | PRN

## 2011-08-24 NOTE — Assessment & Plan Note (Signed)
Urinalysis done in the ED revealed trace hemoglobin and leukocyte esterase with 30 protein; urine culture was negative; patient reports that he accidentally flushed away all his ciprofloxacin; prostate still tender on exam and patient experiences urinary urgency but no dysuria  -Represcribed ciprofloxacin 500 by mouth twice a day x6 weeks -UA was sent to evaluate for worsening RBCs in urine, but afterwards I spoke with the radiologist who reports that no renal stones apparent on CT scan -Patient instructed to followup in 6 weeks regarding this problem, but sooner if needed

## 2011-08-24 NOTE — Assessment & Plan Note (Signed)
Continues to smoke daily, anxiety is worsened by knees of the pancreatic cyst on CT and anticipation of liver biopsy results; counseled on importance of smoking cessation

## 2011-08-24 NOTE — Assessment & Plan Note (Signed)
Patient instructed to restart Zantac

## 2011-08-24 NOTE — Assessment & Plan Note (Signed)
Lab Results  Component Value Date   NA 137 08/21/2011   K 4.4 08/21/2011   CL 100 08/21/2011   CO2 27 08/21/2011   BUN 15 08/21/2011   CREATININE 1.10 08/21/2011   CREATININE 1.26 07/16/2011    BP Readings from Last 3 Encounters:  08/24/11 131/81  08/22/11 127/73  08/20/11 112/66    Assessment: Hypertension control:  controlled  Progress toward goals:  at goal Barriers to meeting goals:  no barriers identified  Plan: Hypertension treatment:  continue current medications

## 2011-08-24 NOTE — Assessment & Plan Note (Signed)
Initially, plan to do MRI to reevaluate pancreatic cyst. After patient left, I called to discuss imaging with radiology, and they noted that the cyst appears similar to a CT scan done in 2008, though was not commented on at that time because it appeared as enhanced bowel, but in comparison with CT abdomen, cyst appears similar in size with benign features. 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  At this time I will not order an MRI; if we decide to pursue MRI in the future, we will request MRI abdomen with and without contrast

## 2011-08-24 NOTE — Assessment & Plan Note (Addendum)
Discussed with radiology, and it is not uncommon for patients to experience pain after liver biopsy due to piercing of the liver capsule. Bleeding is a common complication after biopsy, but the radiologist reviewed patient's imaging, and confirms that there is no evidence of bleeding or other fluid collection. He also reviewed earlier phases of the CT scan, and notes that patient does not have kidney stone. Pancreatitis is in the differential, as patient does report drinking occasional alcohol weekly.  -Norco 5/325 q. 4 hours when necessary pain, #15; patient to call clinic by the end of the week if pain has not resolved -CBC -Lipase  08/25/11: Called patient and left message stating all labs were normal, and we are not going to pursue MRI at this time after speaking with radiologist.  Lipase normal. UA without blood. Patient to continue pain mgmt with Norco.

## 2011-08-24 NOTE — Progress Notes (Signed)
  Subjective:   Patient ID: Jerry Terrell male   DOB: 14-Sep-1960 51 y.o.   MRN: [id number]  HPI: [name] is a 51 y.o. man with history of hepatitis C, hypertension, GERD, and chronic prostatitis who presents with abdominal pain after being evaluated in the emergency room.  On 08/20/2011, patient underwent ultrasound-guided liver biopsy. After biopsy he experienced severe right upper quadrant abdominal pain, and he went to the emergency department on 08/22/2011. Because of concern of some leak, CT scan of his abdomen was done and pain was managed with morphine in the emergency department. CT scan was negative for any abdominal bleeding or fluid collection, but pancreatic cyst was noted on CT scan (1.3 cm x 1.8 cm). He was discharged with instructions to follow up with his PCP, as well as with 10 tablets of Norco 5/325 every 4 hours when necessary pain. Patient reports he has 1 tablet left.  Describes pain as severe, sharp, constant if without his medications, and worse with laying in a certain way. He describes pain is in his right upper quarter and radiating across his epigastric region as well as radiating to his back. He endorses nausea, but denies vomiting. He reports having had some diarrhea prior to the abdominal biopsy, but now he notes daily straining with bowel movement. Denies dysuria. Reports urinary urgency that has been chronic. He complains of occasional abdominal bloating as well as indigestion. Abdominal pain is not associated with food.  He has been seen in January and in July for chronic prostatitis, and in July he was treated with ciprofloxacin 500 mg twice a day for 6 weeks, but patient reports that he accidentally dropped all the ciprofloxacin down the toilet.   Review of Systems: Constitutional: Denies fever, chills, diaphoresis; poor appetite  HEENT: Denies photophobia, eye pain, redness, hearing loss, ear pain, congestion, sore throat, rhinorrhea, sneezing, mouth sores,  trouble swallowing, neck pain, neck stiffness and tinnitus.   Respiratory: Denies SOB, DOE, cough, chest tightness,  and wheezing.   Cardiovascular: Denies chest pain, palpitations and leg swelling.  Gastrointestinal: As per history of present illness Genitourinary: Denies dysuria, flank pain and difficulty urinating.  Musculoskeletal: Denies myalgias, back pain, joint swelling, arthralgias and gait problem.  Skin: Denies pallor, rash and wound.  Neurological: Denies dizziness, seizures, syncope, weakness, light-headedness, numbness and headaches.  Psychiatric/Behavioral: Denies suicidal ideation, mood changes, confusion, nervousness, sleep disturbance and agitation  Objective:  Physical Exam: Filed Vitals:   08/24/11 1601  BP: 131/81  Pulse: 78  Temp: 98.9 F (37.2 C)  TempSrc: Oral  Weight: 164 lb 14.4 oz (74.798 kg)   Constitutional: Vital signs reviewed.  Patient is an anxious-appearing, well-developed and well-nourished man in no acute distress and cooperative with exam. Eyes: PERRL, EOMI, conjunctivae normal, No scleral icterus.  Cardiovascular: RRR, S1 normal, S2 normal, no MRG, pulses symmetric and intact bilaterally Pulmonary/Chest: CTAB, no wheezes, rales, or rhonchi Abdominal: Soft. Tender across bilateral upper quadrants with voluntary guarding, no obvious mass, clean healed incision over right upper quadrant from ultrasound guided biopsy; non-distended, bowel sounds are normal GU: no CVA tenderness Musculoskeletal: No joint deformities, erythema, or stiffness, ROM full and no nontender Neurological: A&O x3  Assessment & Plan:   Case and care discussed with Dr. Meredith Pel. Patient to return in 6 weeks for followup regarding prostatitis, or sooner if pain does not subside. Please see problem-oriented charting for further details.

## 2011-08-24 NOTE — Patient Instructions (Addendum)
-  Please restart zantac  -I have written you for 15 more tablets of hydrocodone-acetaminophen.  Please be sure to take an over the counter stool softener to help with you bowel movements.  Please be sure to bring all of your medications with you to every visit.  Should you have any new or worsening symptoms, please be sure to call the clinic at [phone].

## 2011-08-25 LAB — COMPREHENSIVE METABOLIC PANEL
ALT: 44 U/L (ref 0–53)
Albumin: 4.4 g/dL (ref 3.5–5.2)
Alkaline Phosphatase: 85 U/L (ref 39–117)
CO2: 30 mEq/L (ref 19–32)
Glucose, Bld: 93 mg/dL (ref 70–99)
Potassium: 4.5 mEq/L (ref 3.5–5.3)
Sodium: 138 mEq/L (ref 135–145)
Total Protein: 7.7 g/dL (ref 6.0–8.3)

## 2011-08-25 LAB — URINALYSIS, MICROSCOPIC ONLY
Bacteria, UA: NONE SEEN
Casts: NONE SEEN

## 2011-08-25 LAB — URINALYSIS, ROUTINE W REFLEX MICROSCOPIC
Bilirubin Urine: NEGATIVE
Glucose, UA: NEGATIVE mg/dL
Hgb urine dipstick: NEGATIVE
Ketones, ur: NEGATIVE mg/dL
Leukocytes, UA: NEGATIVE
Nitrite: NEGATIVE
Protein, ur: 30 mg/dL — AB
pH: 6 (ref 5.0–8.0)

## 2011-08-26 ENCOUNTER — Encounter: Payer: Self-pay | Admitting: Internal Medicine

## 2011-08-27 ENCOUNTER — Encounter: Payer: Self-pay | Admitting: Gastroenterology

## 2011-12-02 ENCOUNTER — Encounter: Payer: Self-pay | Admitting: Internal Medicine

## 2011-12-02 ENCOUNTER — Ambulatory Visit (INDEPENDENT_AMBULATORY_CARE_PROVIDER_SITE_OTHER): Payer: Self-pay | Admitting: Internal Medicine

## 2011-12-02 ENCOUNTER — Ambulatory Visit (HOSPITAL_COMMUNITY)
Admission: RE | Admit: 2011-12-02 | Discharge: 2011-12-02 | Disposition: A | Discharge status: Home / Self Care | Payer: Self-pay | Source: Ambulatory Visit | Attending: Internal Medicine | Admitting: Internal Medicine

## 2011-12-02 VITALS — BP 141/94 | HR 74 | Temp 98.4°F | Ht 68.5 in | Wt 173.0 lb

## 2011-12-02 DIAGNOSIS — I10 Essential (primary) hypertension: Secondary | ICD-10-CM

## 2011-12-02 DIAGNOSIS — F17200 Nicotine dependence, unspecified, uncomplicated: Secondary | ICD-10-CM

## 2011-12-02 DIAGNOSIS — N411 Chronic prostatitis: Secondary | ICD-10-CM

## 2011-12-02 DIAGNOSIS — B1920 Unspecified viral hepatitis C without hepatic coma: Secondary | ICD-10-CM

## 2011-12-02 DIAGNOSIS — K862 Cyst of pancreas: Secondary | ICD-10-CM | POA: Insufficient documentation

## 2011-12-02 DIAGNOSIS — R768 Other specified abnormal immunological findings in serum: Secondary | ICD-10-CM

## 2011-12-02 DIAGNOSIS — N419 Inflammatory disease of prostate, unspecified: Secondary | ICD-10-CM

## 2011-12-02 DIAGNOSIS — R319 Hematuria, unspecified: Secondary | ICD-10-CM

## 2011-12-02 DIAGNOSIS — R109 Unspecified abdominal pain: Secondary | ICD-10-CM

## 2011-12-02 DIAGNOSIS — K863 Pseudocyst of pancreas: Secondary | ICD-10-CM | POA: Insufficient documentation

## 2011-12-02 DIAGNOSIS — Z0000 Encounter for general adult medical examination without abnormal findings: Secondary | ICD-10-CM | POA: Insufficient documentation

## 2011-12-02 DIAGNOSIS — R110 Nausea: Secondary | ICD-10-CM

## 2011-12-02 LAB — COMPLETE METABOLIC PANEL WITH GFR
ALT: 52 U/L (ref 0–53)
AST: 30 U/L (ref 0–37)
Albumin: 4 g/dL (ref 3.5–5.2)
Alkaline Phosphatase: 103 U/L (ref 39–117)
CO2: 28 mEq/L (ref 19–32)
Chloride: 103 mEq/L (ref 96–112)
Creat: 1.08 mg/dL (ref 0.50–1.35)
GFR, Est Non African American: 78 mL/min
Potassium: 4.3 mEq/L (ref 3.5–5.3)
Sodium: 141 mEq/L (ref 135–145)
Total Bilirubin: 1 mg/dL (ref 0.3–1.2)
Total Protein: 7.6 g/dL (ref 6.0–8.3)

## 2011-12-02 LAB — LIPID PANEL
Cholesterol: 173 mg/dL (ref 0–200)
HDL: 37 mg/dL — ABNORMAL LOW (ref >39)
LDL Cholesterol: 108 mg/dL — ABNORMAL HIGH (ref 0–99)
Total CHOL/HDL Ratio: 4.7 Ratio
Triglycerides: 140 mg/dL (ref <150)

## 2011-12-02 MED ORDER — HYDROCODONE-ACETAMINOPHEN 5-325 MG PO TABS: 1.0000 | ORAL_TABLET | Freq: Four times a day (QID) | ORAL | Status: DC | PRN

## 2011-12-02 MED ORDER — GADOBENATE DIMEGLUMINE 529 MG/ML IV SOLN
16.0000 mL | Freq: Once | INTRAVENOUS | Status: AC
  Administered 2011-12-02: 16 mL via INTRAVENOUS

## 2011-12-02 MED ORDER — TAMSULOSIN HCL 0.4 MG PO CAPS: 0.4000 mg | ORAL_CAPSULE | Freq: Every day | ORAL | Status: DC

## 2011-12-02 NOTE — Assessment & Plan Note (Signed)
Obtained UA today Patient is symptomatic with urinary incontinence, "irritation with urination", denies dysuria.   He has seen Dr. Etta Grandchild in the past.  He has been taking Cipro 500 mg bid x 42 days.  He had a normal PSA Referred to Dr. Etta Grandchild to further w/u condition, restarted Flomax 0.4 mg qd.

## 2011-12-02 NOTE — Assessment & Plan Note (Signed)
Declined flu vaccine. In the future will need tetanus and colonoscopy.

## 2011-12-02 NOTE — Assessment & Plan Note (Addendum)
With associated worsening epigastric pain, and nausea since 08/2011 per pt.  Previous CT of Abdomen pelvis noted cyst Due to worsening pain with further examine with MRI w/ w/o contrast of abdomen today.  Rx Norco/Vicodin 5-325 mg q6 prn #15 for pain Obtained lipid, lipase, CMP today Will f/u in 2 weeks

## 2011-12-02 NOTE — Assessment & Plan Note (Signed)
BP 141/94 today prob 2/2 pain. Cont. Norvasc 10 mg qd adn HCTZ 25 mg qd

## 2011-12-02 NOTE — Patient Instructions (Addendum)
Please go get your MRI today Please pick up your pain medication and do not take with Tylenol You will have an appointment with urology Dr. Etta Grandchild the nurse will call   Hypertension Hypertension is another name for high blood pressure. High blood pressure may mean that your heart needs to work harder to pump blood. Blood pressure consists of two numbers, which includes a higher number over a lower number (example: 110/72). HOME CARE   Make lifestyle changes as told by your doctor. This may include weight loss and exercise.  Take your blood pressure medicine every day.  Limit how much salt you use.  Stop smoking if you smoke.  Do not use drugs.  Talk to your doctor if you are using decongestants or birth control pills. These medicines might make blood pressure higher.  Females should not drink more than 1 alcoholic drink per day. Males should not drink more than 2 alcoholic drinks per day.  See your doctor as told. GET HELP RIGHT AWAY IF:   You have a blood pressure reading with a top number of 180 or higher.  You get a very bad headache.  You get blurred or changing vision.  You feel confused.  You feel weak, numb, or faint.  You get chest or belly (abdominal) pain.  You throw up (vomit).  You cannot breathe very well. MAKE SURE YOU:   Understand these instructions.  Will watch your condition.  Will get help right away if you are not doing well or get worse. Document Released: 06/24/2007 Document Revised: 03/30/2011 Document Reviewed: 06/24/2007 [facility] Patient Information 2013 [place], [place].

## 2011-12-02 NOTE — Progress Notes (Signed)
Subjective:    Patient ID: Jerry Terrell, male    DOB: Jul 03, 1960, 51 y.o.   MRN: [id number]  HPI Comments: 51 y.o man with h/o tobacco abuse (smoking 5 cigarettes qd currently), HTN (BP 141/94), CAD, asthma, h/o bronchitis, GERD, chronic unspecified prostatitis, HCV antibody positive, Hep B core total ab positive, pancreatic cyst.  He presents for epigastric pain radiating to b/l flanks and back.  He has noted this since 08/2011.  Pain is tender and worsening.  At times it can be 10/10 but today is 8/10.  He has tried Tylenol and been taking up to 8 daily but has only had 2 pills today at 5 am.  He does not get relief with Tylenol.  He has taken Norco/Vicodin 5-325 #15 but is out the Rx which seemed to help.  He also experiences bloating and a knot sensation in his abdomen in the epigastric region.  Pain is worse with coughing or vomiting or foods i.e fried foods or hard foods. He is not experience vomiting lately but has been nauseated or "whoozy".  He is eating okay though his appetite is decreased.  When he feels nauseated he will eat crackers, drink water or ginger ale.  He is scared about the cyst in his pancreas.  His sister is a Orthoptist and is concerned as well. He also reports his blood pressure has been elevated a home lately and he has a meter at home.  His BP normally ranges 120s/80s  He also mentions a problem with his prostate and has seen Dr. Etta Grandchild (urology) in the past.  He denies dysuria but report irritation with urination, urinary incontinence.  Prostate problem has been ongoing for 4-6 years.  He thinks he has swelling in that area.  His PSA was normal.  He has not been taking Flomax as he previously has taken.  He is also experiencing leakage intermittently but denies seeing gross blood.  He denies increased frequency as well.  He has been on Cipro 500 mg bid x 42 days with no improvement in symptoms.    He reports coughing yellow to black to brown mucous and had a fever 101 F  yesterday. He also has had cold/hot flashes, headache.  These symptoms have been present for 1.5 weeks.  He is currently afebrile 98.4 F and coughing thick white mucous.  He was around his grandson and other sick contacts w/in the last 2 weeks.    He is seeing a doctor for his Hepatitis C virus in [place], [place].  He has an appt 12/03/11. He has not started any medication but may soon in the first of the year  SH: He is engaged.  He is unable to work currently due to abdominal pain but he is an Personnel officer by trade.  He smokes 5 cigarettes per day.  He reports eating a healthy diet.  He only has beer 2-3 times a week and wine (a glass) on occasion.  He states he no longer smokes marijuana.      Abdominal Pain This is a new problem. The current episode started more than 1 month ago. The onset quality is gradual. The problem occurs constantly. The problem has been gradually worsening. The pain is located in the epigastric region, RUQ and LUQ. The pain is at a severity of 8/10. The pain is severe. Quality: tender. The abdominal pain radiates to the back and right flank (left flank). Associated symptoms include nausea. Pertinent negatives include no diarrhea, dysuria, headaches or  hematuria.  URI  Associated symptoms include abdominal pain and nausea. Pertinent negatives include no diarrhea, dysuria or headaches.      Review of Systems  Constitutional: Positive for appetite change.       Decreased appetite but overall eating okay  HENT:       Denies sore throat but feels lump in throat with swallowing.    Cardiovascular: Positive for leg swelling.       Lower ext swelling L>R intermittently  Gastrointestinal: Positive for nausea and abdominal pain. Negative for diarrhea.       Denies vomiting, intermittent diarrhea  Genitourinary: Negative for dysuria and hematuria.  Skin:       +dry skin  Neurological: Negative for headaches.       Objective:   Physical Exam  Nursing note and vitals  reviewed. Constitutional: He is oriented to person, place, and time. He appears well-developed and well-nourished. He is cooperative. He appears distressed.       Mildly distressed 2/2 pain in abdomen  HENT:  Head: Normocephalic and atraumatic.  Mouth/Throat: Oropharynx is clear and moist and mucous membranes are normal. No oropharyngeal exudate.  Eyes: Pupils are equal, round, and reactive to light. Right eye exhibits no discharge. Left eye exhibits no discharge. Right conjunctiva is injected. Left conjunctiva is injected. No scleral icterus.       Mildly erythematous conjunctiva  Cardiovascular: Normal rate, regular rhythm, S1 normal, S2 normal and normal heart sounds.   No murmur heard. Pulmonary/Chest: Effort normal and breath sounds normal. No respiratory distress. He has no wheezes.  Abdominal: Soft. Bowel sounds are normal. He exhibits no distension. There is tenderness in the right upper quadrant, epigastric area and suprapubic area.       TTP epigastric region and RUQ and LUQ. No guarding, normal bowel sounds.  TTP suprapubic region  No mass palpated on abdominal exam  Lymphadenopathy:    He has no cervical adenopathy.       Right: No supraclavicular adenopathy present.       Left: No supraclavicular adenopathy present.  Neurological: He is alert and oriented to person, place, and time. No cranial nerve deficit. Coordination normal.  Skin: Skin is warm, dry and intact. No rash noted. He is not diaphoretic.       Xerosis to legs   Psychiatric: He has a normal mood and affect. His speech is normal and behavior is normal. Judgment and thought content normal.          Assessment & Plan:  F/u in 2 weeks with Shirlee Latch

## 2011-12-02 NOTE — Assessment & Plan Note (Signed)
Still smoking 5 cig/qd encouraged to quit

## 2011-12-02 NOTE — Assessment & Plan Note (Addendum)
>  2 million viral load 02/2011. Via biopsy grade 1 fibrosis 1.   His fiance also has HCV He follows with someone in [place] and will call back with the name so we can have for out records Not currently on tx

## 2011-12-03 ENCOUNTER — Telehealth: Payer: Self-pay | Admitting: Internal Medicine

## 2011-12-03 ENCOUNTER — Encounter: Payer: Self-pay | Admitting: Internal Medicine

## 2011-12-03 LAB — URINALYSIS, ROUTINE W REFLEX MICROSCOPIC
Bilirubin Urine: NEGATIVE
Hgb urine dipstick: NEGATIVE
Ketones, ur: NEGATIVE mg/dL
Leukocytes, UA: NEGATIVE
Nitrite: NEGATIVE
Protein, ur: 100 mg/dL — AB
pH: 7.5 (ref 5.0–8.0)

## 2011-12-03 LAB — URINALYSIS, MICROSCOPIC ONLY
Bacteria, UA: NONE SEEN
Casts: NONE SEEN

## 2011-12-03 NOTE — Telephone Encounter (Signed)
Spoke with patient updated him on results from 12/02/11 including MRI. Advised increase in size of lesion and appears to be multiple cystic lesions in the collection.  If continues to be a problem a specialist visit may benefit  Patient in the future.  Advised to let his hepatologist know about this as well and get an opinion.    The MRI says the pancreatic duct is intact and per up to date conservative management is recommended.   Shirlee Latch MD

## 2011-12-03 NOTE — Progress Notes (Signed)
I saw, examined, and discussed the patient with Dr Shirlee Latch and agree with the note contained here. Ongoing mid upper and R sided ABD pain since about Aug. He assoc it with the timing of his liver bx. Increasing in severity. CT after bx didn't show bleeding, lipase was nl, as well as LFT's. CT did show pancreatic cyst but unchanged from yrs prior. Due to unrelenting sxs, will check MRI ABD. Also CMP and lipase. Limited supply of hydrocodone (acute treatment not chronic med) and F/U appt.  Pt has appt with liver / Hep clinic in [facility].

## 2011-12-07 ENCOUNTER — Other Ambulatory Visit: Payer: Self-pay | Admitting: *Deleted

## 2011-12-07 MED ORDER — HYDROCODONE-ACETAMINOPHEN 5-325 MG PO TABS: 1.0000 | ORAL_TABLET | Freq: Four times a day (QID) | ORAL | Status: DC | PRN

## 2011-12-07 NOTE — Telephone Encounter (Signed)
Pt stated he took his last pain pill last pm, would like a refill

## 2011-12-07 NOTE — Addendum Note (Signed)
Addended by: Annett Gula on: 12/07/2011 11:31 AM   Modules accepted: Orders

## 2011-12-07 NOTE — Telephone Encounter (Signed)
Called to pharm 

## 2011-12-16 ENCOUNTER — Ambulatory Visit: Payer: Self-pay | Admitting: Internal Medicine

## 2011-12-21 ENCOUNTER — Other Ambulatory Visit: Payer: Self-pay | Admitting: *Deleted

## 2011-12-21 NOTE — Telephone Encounter (Signed)
Patient has an appt 12/22/11 at 9:15 am with Dr. Bosie Clos

## 2011-12-22 ENCOUNTER — Ambulatory Visit (INDEPENDENT_AMBULATORY_CARE_PROVIDER_SITE_OTHER): Payer: Self-pay | Admitting: Internal Medicine

## 2011-12-22 ENCOUNTER — Telehealth: Payer: Self-pay

## 2011-12-22 ENCOUNTER — Encounter: Payer: Self-pay | Admitting: Internal Medicine

## 2011-12-22 ENCOUNTER — Other Ambulatory Visit: Payer: Self-pay

## 2011-12-22 VITALS — BP 133/86 | HR 76 | Temp 99.3°F | Ht 68.5 in | Wt 176.1 lb

## 2011-12-22 DIAGNOSIS — K862 Cyst of pancreas: Secondary | ICD-10-CM

## 2011-12-22 DIAGNOSIS — K863 Pseudocyst of pancreas: Secondary | ICD-10-CM

## 2011-12-22 DIAGNOSIS — I10 Essential (primary) hypertension: Secondary | ICD-10-CM

## 2011-12-22 DIAGNOSIS — F17200 Nicotine dependence, unspecified, uncomplicated: Secondary | ICD-10-CM

## 2011-12-22 MED ORDER — HYDROCODONE-ACETAMINOPHEN 5-325 MG PO TABS: 1.0000 | ORAL_TABLET | Freq: Four times a day (QID) | ORAL | Status: DC | PRN

## 2011-12-22 NOTE — Assessment & Plan Note (Signed)
EUS per Dr. Christella Hartigan of [facility] 01/07/2012

## 2011-12-22 NOTE — Telephone Encounter (Signed)
Pt has been instructed and meds reviewed pt will call with any further questions or concerns

## 2011-12-22 NOTE — Progress Notes (Signed)
  Subjective:    Patient ID: Jerry Terrell, male    DOB: 05/03/1960, 51 y.o.   MRN: [id number]  HPI Patient presents for pain medicine refill secondary to pancreatic pseudocys demonstrated on MRI he has a GI appointment scheduled for 01/07/2012. Of note the patient reports that he had lying on Thanksgiving with subsequent increase in abdominal pain. Denies coughing up blood, mucus or blood in stool. Has a history of chronic prostatitis with followup appointment at [facility]. States that he's been compliant on his medication only had one pill of the right and left which he took yesterday.    Review of Systems As per history of present illness    Objective:   Physical Exam  Constitutional: He is oriented to person, place, and time. He appears well-developed and well-nourished. No distress.       Conversive  HENT:  Head: Normocephalic and atraumatic.  Pulmonary/Chest: Effort normal and breath sounds normal.  Abdominal: Soft. Bowel sounds are normal. There is tenderness. There is no rebound.       Epigastric tenderness to deep palpation in laughter  Neurological: He is alert and oriented to person, place, and time.  Skin: Skin is warm and dry.  Psychiatric: He has a normal mood and affect. His behavior is normal. Judgment and thought content normal.          Assessment & Plan:  #1 pancreatic pseudocyst: Follow up with GI for EUS 01/07/2012 -Refill of Norco 5/325 every 6 hours #36 -Advised to avoid alcohol and fatty food  #2 hypertension: At goal bp133/86 pulse 76 bpm on amlodipine 10 mg and 25mg hydrochlorothiazide  #3 tobacco abuse: Patient reports cutting down recently to a few cigarettes per day, ports day he quit for 15 years previously he feels like he can do it again without additional resources carotid by her clinic is surgically declining website, telephone assistance, or resources per social worker     

## 2011-12-22 NOTE — Patient Instructions (Addendum)
General Instructions: Do not drink ANY alcohol.  This will worsen your pancreas pain. Keep your appointment with the the gut doctor. I have given you a refill for the pain medication.   Treatment Goals:  Goals (1 Years of Data) as of 12/22/2011    None      Progress Toward Treatment Goals:  Treatment Goal 12/22/2011  Blood pressure at goal  Stop smoking smoking less    Self Care Goals & Plans:  Continue to cut down on smoking.  Have a goal to stop completely.     Care Management & Community Referrals: Declined resources to assist with smoking today.

## 2011-12-22 NOTE — Telephone Encounter (Signed)
Left message on machine to call back  

## 2011-12-22 NOTE — Assessment & Plan Note (Signed)
BP Readings from Last 3 Encounters:  12/22/11 133/86  12/02/11 141/94  08/24/11 131/81    Sodium  Date Value Range Status  12/02/2011 141  135 - 145 mEq/L Final     Performed at [facility]     Potassium  Date Value Range Status  12/02/2011 4.3  3.5 - 5.3 mEq/L Final     Creat  Date Value Range Status  12/02/2011 1.08  0.50 - 1.35 mg/dL Final    Assessment:  Blood pressure control: controlled  Progress toward BP goal:  at goal  Comments:   Plan:  Medications:  continue current medications  Educational resources provided:    Self management tools provided:    Other plans:

## 2011-12-22 NOTE — Assessment & Plan Note (Signed)
  Assessment:  Progress toward smoking cessation:  stress  Barriers to progress toward smoking cessation:  smoking less  Comments: pt to stop on his own accord, declines clinic resources  Plan:  Instruction/counseling given:  I advised patient to stop smoking.  Educational resources provided:  smoking cessation handout (tips, strategies, fact sheets)  Self management tools provided:     Medications to assist with smoking cessation:  pt declined Patient agreed to the following self-care plans for smoking cessation:      Other: will utilize prior techniques of chewing regular gum

## 2011-12-31 ENCOUNTER — Encounter (HOSPITAL_COMMUNITY): Payer: Self-pay | Admitting: *Deleted

## 2011-12-31 ENCOUNTER — Encounter (HOSPITAL_COMMUNITY): Payer: Self-pay | Admitting: Pharmacy Technician

## 2011-12-31 NOTE — Pre-Procedure Instructions (Signed)
Your procedure is scheduled on: Thursday, January 07, 2012 Report to [facility] Admitting [id number] Call this number if you have problems morning of your [phone]  Follow all bowel prep instructions per your doctor's orders.  Do not eat or drink anything after midnight the night before your procedure. You may brush your teeth, rinse out your mouth, but no water, no food, no chewing gum, no mints, no candies, no chewing tobacco.     Take these medicines the morning of your procedure with A SIP OF WATER:Hydrocodone if needed   Please make arrangements for a responsible person to drive you home after the procedure. You cannot go home by cab/taxi. We recommend you have someone with you at home the first 24 hours after your procedure. Driver for procedure is sister Earcele  LEAVE ALL VALUABLES, JEWELRY, BILLFOLD AT HOME.  NO DENTURES, CONTACT LENSES ALLOWED IN THE ENDOSCOPY ROOM.   YOU MAY WEAR DEODORANT, PLEASE REMOVE ALL JEWELRY, WATCHES RINGS, BODY PIERCINGS AND LEAVE AT HOME.   WOMEN: NO MAKE-UP, LOTIONS PERFUMES

## 2012-01-07 ENCOUNTER — Encounter (HOSPITAL_COMMUNITY): Payer: Self-pay | Admitting: Anesthesiology

## 2012-01-07 ENCOUNTER — Encounter (HOSPITAL_COMMUNITY)
Admission: RE | Disposition: A | Discharge status: Home / Self Care | Payer: Self-pay | Source: Ambulatory Visit | Attending: Gastroenterology

## 2012-01-07 ENCOUNTER — Encounter (HOSPITAL_COMMUNITY): Payer: Self-pay

## 2012-01-07 ENCOUNTER — Telehealth: Payer: Self-pay | Admitting: Internal Medicine

## 2012-01-07 ENCOUNTER — Ambulatory Visit (HOSPITAL_COMMUNITY)
Admission: RE | Admit: 2012-01-07 | Discharge: 2012-01-07 | Disposition: A | Discharge status: Home / Self Care | Payer: No Typology Code available for payment source | Source: Ambulatory Visit | Attending: Gastroenterology | Admitting: Gastroenterology

## 2012-01-07 ENCOUNTER — Ambulatory Visit (HOSPITAL_COMMUNITY): Payer: No Typology Code available for payment source | Admitting: Anesthesiology

## 2012-01-07 DIAGNOSIS — K863 Pseudocyst of pancreas: Secondary | ICD-10-CM

## 2012-01-07 DIAGNOSIS — F17200 Nicotine dependence, unspecified, uncomplicated: Secondary | ICD-10-CM | POA: Insufficient documentation

## 2012-01-07 DIAGNOSIS — K862 Cyst of pancreas: Secondary | ICD-10-CM

## 2012-01-07 DIAGNOSIS — I10 Essential (primary) hypertension: Secondary | ICD-10-CM | POA: Insufficient documentation

## 2012-01-07 DIAGNOSIS — R109 Unspecified abdominal pain: Secondary | ICD-10-CM | POA: Insufficient documentation

## 2012-01-07 HISTORY — DX: Family history of other specified conditions: Z84.89

## 2012-01-07 HISTORY — DX: Other complications of anesthesia, initial encounter: T88.59XA

## 2012-01-07 HISTORY — DX: Adverse effect of unspecified anesthetic, initial encounter: T41.45XA

## 2012-01-07 HISTORY — PX: EUS: SHX5427

## 2012-01-07 SURGERY — UPPER ENDOSCOPIC ULTRASOUND (EUS) LINEAR
Anesthesia: Monitor Anesthesia Care

## 2012-01-07 MED ORDER — LACTATED RINGERS IV SOLN
INTRAVENOUS | Status: DC | PRN
  Administered 2012-01-07: 10:00:00 via INTRAVENOUS

## 2012-01-07 MED ORDER — PROPOFOL 10 MG/ML IV EMUL
INTRAVENOUS | Status: DC | PRN
  Administered 2012-01-07: 75 ug/kg/min via INTRAVENOUS

## 2012-01-07 MED ORDER — LACTATED RINGERS IV SOLN
INTRAVENOUS | Status: DC
  Administered 2012-01-07: 100 mL via INTRAVENOUS

## 2012-01-07 MED ORDER — ONDANSETRON HCL 4 MG/2ML IJ SOLN
INTRAMUSCULAR | Status: DC | PRN
  Administered 2012-01-07 (×2): 2 mg via INTRAVENOUS

## 2012-01-07 MED ORDER — FENTANYL CITRATE 0.05 MG/ML IJ SOLN
INTRAMUSCULAR | Status: DC | PRN
  Administered 2012-01-07: 100 ug via INTRAVENOUS

## 2012-01-07 MED ORDER — SODIUM CHLORIDE 0.9 % IV SOLN: INTRAVENOUS | Status: DC

## 2012-01-07 NOTE — Anesthesia Postprocedure Evaluation (Signed)
Anesthesia Post Note  Patient: Jerry Terrell  Procedure(s) Performed: Procedure(s) (LRB): UPPER ENDOSCOPIC ULTRASOUND (EUS) LINEAR (N/A)  Anesthesia type: MAC  Patient location: PACU  Post pain: Pain level controlled  Post assessment: Post-op Vital signs reviewed  Last Vitals:  Filed Vitals:   01/07/12 1130  BP: 140/88  Pulse:   Temp:   Resp: 11    Post vital signs: Reviewed  Level of consciousness: sedated  Complications: No apparent anesthesia complications

## 2012-01-07 NOTE — Op Note (Signed)
[facility] [address] [place], [address]   ENDOSCOPIC ULTRASOUND PROCEDURE REPORT  PATIENT: Jerry Terrell, Jerry Terrell  MR#: [id number] BIRTHDATE: 10-21-1960  GENDER: Male ENDOSCOPIST: Rachael Fee, MD REFERRED BY:  Desma Maxim, MD PROCEDURE DATE:  01/07/2012 PROCEDURE:   Upper EUS ASA CLASS:      Class III INDICATIONS:   abdominal pain; pancreatic cyst since at least 2008 that has probably grown slightly in the interval. MEDICATIONS: MAC sedation, administered by CRNA  DESCRIPTION OF PROCEDURE:   After the risks benefits and alternatives of the procedure were  explained, informed consent was obtained. The patient was then placed in the left, lateral, decubitus postion and IV sedation was administered. Throughout the procedure, the patients blood pressure, pulse and oxygen saturations were monitored continuously.  Under direct visualization, the Pentax Radial EUS [id number] and [id number] [id number] endoscope was introduced through the mouth  and advanced to the second portion of the duodenum .  Water was used as necessary to provide an acoustic interface.  Upon completion of the imaging, water was removed and the patient was sent to the recovery room in satisfactory condition.  Endoscopic findings: 1. Normal UGI tract EUS findings: 1. Pancreatic parenchyma was diffusely abnormal with slightly honeycomb pattern to the gland. 2. Main pancreatic duct was normal, non-dilated 3. CBD was normal, non-dilated 4. Cystic lesion in pancreatic head mesuring 1.7cm maximally. The cyst has several thin septea, no associated solid mass or mural nodules. There were several small to medium blood vessels between the linear transducer and the cystic lesion that precluded safe FNA attempt.  The lesion does not appear to communicate with the main pancreatic duct. 5. No peripancreatic adenopathy 6. Limited views of liver, spleen, portal and splenic vessels were all  normal.  Impression: 1.7cm cystic lesion in head of pancreas without any concerning morphologic features.  Unable to attempt FNA due to nearby, overlying blood vessels.  There were some changes in pancreatic parenchyma that were suggestive of chronic pancreatitis. Since the cyst has at least been present for 5 years and has not shown signficant change in size, I think it is very unlikely to harbor malignancy.  I recommend repeat MRI of pancreas in 12 months from now for surveillance of the cyst.   It is doubtful that the cyst is causing or contributing to his abdominal pains.   _______________________________ eSignedRachael Fee, MD 01/07/2012 11:14 AM

## 2012-01-07 NOTE — Anesthesia Preprocedure Evaluation (Addendum)
Anesthesia Evaluation  Patient identified by MRN, date of birth, ID band Patient awake    Reviewed: Allergy & Precautions, H&P , NPO status , Patient's Chart, lab work & pertinent test results  History of Anesthesia Complications (+) MALIGNANT HYPERTHERMIA and Family history of anesthesia reaction  Airway Mallampati: II TM Distance: >3 FB Neck ROM: Full    Dental  (+) Teeth Intact and Dental Advisory Given   Pulmonary asthma , Current Smoker,  breath sounds clear to auscultation  Pulmonary exam normal       Cardiovascular hypertension, Pt. on medications + CAD Rhythm:Regular Rate:Normal     Neuro/Psych  Headaches, negative psych ROS   GI/Hepatic GERD-  Medicated,(+) Hepatitis - (HCV antibody positive), C  Endo/Other  negative endocrine ROS  Renal/GU Renal diseasenegative Renal ROS  negative genitourinary   Musculoskeletal negative musculoskeletal ROS (+)   Abdominal   Peds  Hematology negative hematology ROS (+)   Anesthesia Other Findings   Reproductive/Obstetrics negative OB ROS                          Anesthesia Physical Anesthesia Plan  ASA: III  Anesthesia Plan: MAC   Post-op Pain Management:    Induction: Intravenous  Airway Management Planned: Nasal Cannula  Additional Equipment:   Intra-op Plan:   Post-operative Plan:   Informed Consent: I have reviewed the patients History and Physical, chart, labs and discussed the procedure including the risks, benefits and alternatives for the proposed anesthesia with the patient or authorized representative who has indicated his/her understanding and acceptance.   Dental advisory given  Plan Discussed with: CRNA  Anesthesia Plan Comments:         Anesthesia Quick Evaluation

## 2012-01-07 NOTE — Transfer of Care (Signed)
Immediate Anesthesia Transfer of Care Note  Patient: Jerry Terrell  Procedure(s) Performed: Procedure(s) (LRB) with comments: UPPER ENDOSCOPIC ULTRASOUND (EUS) LINEAR (N/A)  Patient Location: PACU  Anesthesia Type:MAC  Level of Consciousness: awake and sedated  Airway & Oxygen Therapy: Patient Spontanous Breathing and Patient connected to nasal cannula oxygen  Post-op Assessment: Report given to PACU RN and Post -op Vital signs reviewed and stable  Post vital signs: Reviewed and stable  Complications: No apparent anesthesia complications

## 2012-01-07 NOTE — Telephone Encounter (Signed)
Epic message from Dr. Christella Hartigan   Just completed EUS Endoscopic findings: 1. Normal UGI tract EUS findings: 1. Pancreatic parenchyma was diffusely abnormal with slightly honeycomb pattern to the gland. 2. Main pancreatic duct was normal, non-dilated 3. CBD was normal, non-dilated 4. Cystic lesion in pancreatic head mesuring 1.7cm maximally. The cyst has several thin septea, no associated solid mass or mural nodules. There were several small to medium blood vessels between the linear transducer and the cystic lesion that precluded safe FNA attempt. The lesion does not appear to communicate with the main pancreatic duct. 5. No peripancreatic adenopathy 6. Limited views of liver, spleen, portal and splenic vessels were all normal. Impression: 1.7cm cystic lesion in head of pancreas without any concerning morphologic features. Unable to attempt FNA due to nearby, overlying blood vessels. There were some changes in pancreatic parenchyma that were suggestive of chronic pancreatitis. Since the cyst has at least been present for 5 years and has not shown signficant change in size, I think it is very unlikely to harbor malignancy. I recommend repeat MRI of pancreas in 12 months from now for surveillance of the cyst. It is doubtful that the cyst is causing or contributing to his abdominal pains. He asked several times about pain medicine prescription however I did not write him for any. I am not sure what is causing his pain, unlikely that the cyst is responsible.   Shirlee Latch MD

## 2012-01-07 NOTE — Anesthesia Postprocedure Evaluation (Signed)
Anesthesia Post Note  Patient: Jerry Terrell  Procedure(s) Performed: Procedure(s) (LRB): UPPER ENDOSCOPIC ULTRASOUND (EUS) LINEAR (N/A)  Anesthesia type: MAC  Patient location: PACU  Post pain: Pain level controlled  Post assessment: Post-op Vital signs reviewed  Last Vitals:  Filed Vitals:   01/07/12 1115  BP: 128/71  Pulse: 68  Temp: 36.7 C  Resp: 10    Post vital signs: Reviewed  Level of consciousness: sedated  Complications: No apparent anesthesia complications

## 2012-01-07 NOTE — H&P (View-Only) (Signed)
  Subjective:    Patient ID: Jerry Terrell, male    DOB: 1960-08-02, 51 y.o.   MRN: [id number]  HPI Patient presents for pain medicine refill secondary to pancreatic pseudocys demonstrated on MRI he has a GI appointment scheduled for 01/07/2012. Of note the patient reports that he had lying on Thanksgiving with subsequent increase in abdominal pain. Denies coughing up blood, mucus or blood in stool. Has a history of chronic prostatitis with followup appointment at [facility]. States that he's been compliant on his medication only had one pill of the right and left which he took yesterday.    Review of Systems As per history of present illness    Objective:   Physical Exam  Constitutional: He is oriented to person, place, and time. He appears well-developed and well-nourished. No distress.       Conversive  HENT:  Head: Normocephalic and atraumatic.  Pulmonary/Chest: Effort normal and breath sounds normal.  Abdominal: Soft. Bowel sounds are normal. There is tenderness. There is no rebound.       Epigastric tenderness to deep palpation in laughter  Neurological: He is alert and oriented to person, place, and time.  Skin: Skin is warm and dry.  Psychiatric: He has a normal mood and affect. His behavior is normal. Judgment and thought content normal.          Assessment & Plan:  #1 pancreatic pseudocyst: Follow up with GI for EUS 01/07/2012 -Refill of Norco 5/325 every 6 hours #36 -Advised to avoid alcohol and fatty food  #2 hypertension: At goal bp133/86 pulse 76 bpm on amlodipine 10 mg and 25mg  hydrochlorothiazide  #3 tobacco abuse: Patient reports cutting down recently to a few cigarettes per day, ports day he quit for 15 years previously he feels like he can do it again without additional resources carotid by her clinic is surgically declining website, telephone assistance, or resources per social worker

## 2012-01-07 NOTE — Interval H&P Note (Signed)
History and Physical Interval Note:  01/07/2012 10:15 AM  Jerry Terrell  has presented today for surgery, with the diagnosis of Pancreas cyst [577.2]  The various methods of treatment have been discussed with the patient and family. After consideration of risks, benefits and other options for treatment, the patient has consented to  Procedure(s) (LRB) with comments: UPPER ENDOSCOPIC ULTRASOUND (EUS) LINEAR (N/A) as a surgical intervention .  The patient's history has been reviewed, patient examined, no change in status, stable for surgery.  I have reviewed the patient's chart and labs.  Questions were answered to the patient's satisfaction.     Rob Bunting

## 2012-01-08 ENCOUNTER — Encounter (HOSPITAL_COMMUNITY): Payer: Self-pay | Admitting: Gastroenterology

## 2012-01-15 ENCOUNTER — Other Ambulatory Visit: Payer: Self-pay | Admitting: *Deleted

## 2012-01-15 ENCOUNTER — Telehealth: Payer: Self-pay | Admitting: Internal Medicine

## 2012-01-15 DIAGNOSIS — K862 Cyst of pancreas: Secondary | ICD-10-CM

## 2012-01-15 MED ORDER — HYDROCODONE-ACETAMINOPHEN 5-325 MG PO TABS: 1.0000 | ORAL_TABLET | Freq: Three times a day (TID) | ORAL | Status: DC | PRN

## 2012-01-15 NOTE — Telephone Encounter (Signed)
Left message for patient to call and schedule appt for 2 weeks.  I will call in Norco 5-325 mg q8 #42 no refills. Received epic message from Dr. Christella Hartigan that stated that pancreatic pseudocyst should not be a source of pain.  He will need to call the clinic for follow up and w/u for abdominal pain in the future.  If he continues to get narcotics he will need to sign a pain contract and periodic UDS per protocol.    Shirlee Latch MD

## 2012-01-27 ENCOUNTER — Telehealth: Payer: Self-pay | Admitting: Gastroenterology

## 2012-01-27 NOTE — Telephone Encounter (Signed)
Pt was advised call his PCP or his GI, I explained that Dr Christella Hartigan performed his EUS but he needs to have ongoing care with his referring MD.

## 2012-02-04 ENCOUNTER — Ambulatory Visit: Payer: No Typology Code available for payment source | Admitting: Internal Medicine

## 2012-02-12 ENCOUNTER — Other Ambulatory Visit: Payer: Self-pay | Admitting: *Deleted

## 2012-02-12 DIAGNOSIS — K862 Cyst of pancreas: Secondary | ICD-10-CM

## 2012-02-12 NOTE — Telephone Encounter (Signed)
Last filled 12/27

## 2012-02-13 MED ORDER — HYDROCODONE-ACETAMINOPHEN 5-325 MG PO TABS: 1.0000 | ORAL_TABLET | Freq: Three times a day (TID) | ORAL | Status: DC | PRN

## 2012-02-15 NOTE — Telephone Encounter (Signed)
Rx called in 

## 2012-02-17 ENCOUNTER — Ambulatory Visit: Payer: No Typology Code available for payment source

## 2012-02-23 ENCOUNTER — Ambulatory Visit: Payer: No Typology Code available for payment source

## 2012-02-25 ENCOUNTER — Encounter: Payer: Self-pay | Admitting: Internal Medicine

## 2012-02-25 ENCOUNTER — Encounter: Payer: No Typology Code available for payment source | Admitting: Internal Medicine

## 2012-02-25 ENCOUNTER — Ambulatory Visit: Payer: No Typology Code available for payment source

## 2012-02-25 ENCOUNTER — Ambulatory Visit (INDEPENDENT_AMBULATORY_CARE_PROVIDER_SITE_OTHER): Payer: No Typology Code available for payment source | Admitting: Internal Medicine

## 2012-02-25 VITALS — BP 142/87 | HR 87 | Temp 99.3°F | Ht 68.5 in | Wt 176.4 lb

## 2012-02-25 DIAGNOSIS — Z598 Other problems related to housing and economic circumstances: Secondary | ICD-10-CM

## 2012-02-25 DIAGNOSIS — R894 Abnormal immunological findings in specimens from other organs, systems and tissues: Secondary | ICD-10-CM

## 2012-02-25 DIAGNOSIS — K863 Pseudocyst of pancreas: Secondary | ICD-10-CM

## 2012-02-25 DIAGNOSIS — Z599 Problem related to housing and economic circumstances, unspecified: Secondary | ICD-10-CM

## 2012-02-25 DIAGNOSIS — N411 Chronic prostatitis: Secondary | ICD-10-CM

## 2012-02-25 DIAGNOSIS — R319 Hematuria, unspecified: Secondary | ICD-10-CM

## 2012-02-25 DIAGNOSIS — R109 Unspecified abdominal pain: Secondary | ICD-10-CM

## 2012-02-25 DIAGNOSIS — K862 Cyst of pancreas: Secondary | ICD-10-CM

## 2012-02-25 DIAGNOSIS — R768 Other specified abnormal immunological findings in serum: Secondary | ICD-10-CM

## 2012-02-25 LAB — COMPREHENSIVE METABOLIC PANEL
ALT: 39 U/L (ref 0–53)
AST: 25 U/L (ref 0–37)
CO2: 27 mEq/L (ref 19–32)
Calcium: 10 mg/dL (ref 8.4–10.5)
Chloride: 99 mEq/L (ref 96–112)
Creat: 1.2 mg/dL (ref 0.50–1.35)
Sodium: 137 mEq/L (ref 135–145)
Total Bilirubin: 0.9 mg/dL (ref 0.3–1.2)
Total Protein: 7.8 g/dL (ref 6.0–8.3)

## 2012-02-25 LAB — LIPASE: Lipase: 35 U/L (ref 11–59)

## 2012-02-25 MED ORDER — HYDROCODONE-ACETAMINOPHEN 5-325 MG PO TABS: 1.0000 | ORAL_TABLET | Freq: Three times a day (TID) | ORAL | Status: DC | PRN

## 2012-02-25 NOTE — Patient Instructions (Addendum)
Please follow up with Dr. Christella Hartigan while I am working on getting you into [facility]  Abdominal Pain Abdominal pain can be caused by many things. Your caregiver decides the seriousness of your pain by an examination and possibly blood tests and X-rays. Many cases can be observed and treated at home. Most abdominal pain is not caused by a disease and will probably improve without treatment. However, in many cases, more time must pass before a clear cause of the pain can be found. Before that point, it may not be known if you need more testing, or if hospitalization or surgery is needed. HOME CARE INSTRUCTIONS   Do not take laxatives unless directed by your caregiver.  Take pain medicine only as directed by your caregiver.  Only take over-the-counter or prescription medicines for pain, discomfort, or fever as directed by your caregiver.  Try a clear liquid diet (broth, tea, or water) for as long as directed by your caregiver. Slowly move to a bland diet as tolerated. SEEK IMMEDIATE MEDICAL CARE IF:   The pain does not go away.  You have a fever.  You keep throwing up (vomiting).  The pain is felt only in portions of the abdomen. Pain in the right side could possibly be appendicitis. In an adult, pain in the left lower portion of the abdomen could be colitis or diverticulitis.  You pass bloody or black tarry stools. MAKE SURE YOU:   Understand these instructions.  Will watch your condition.  Will get help right away if you are not doing well or get worse. Document Released: 10/15/2004 Document Revised: 03/30/2011 Document Reviewed: 08/24/2007 [facility] Patient Information 2013 [place], [place].

## 2012-02-26 ENCOUNTER — Encounter: Payer: Self-pay | Admitting: Internal Medicine

## 2012-02-26 LAB — PRESCRIPTION ABUSE MONITORING 15P, URINE
Creatinine, Urine: 513.07 mg/dL (ref >20.0)
Methadone Screen, Urine: NEGATIVE ng/mL
Oxycodone Screen, Ur: NEGATIVE ng/mL
Propoxyphene: NEGATIVE ng/mL
Zolpidem, Urine: NEGATIVE ng/mL

## 2012-02-26 NOTE — Progress Notes (Signed)
Subjective:    Patient ID: Jerry Terrell, male    DOB: 06-10-1960, 52 y.o.   MRN: [id number]  HPI Comments: 53 y.o presents for chronic abdominal pain. CT noted pancreatic cyst since 2008 which has increased in size, minimally now causing bloating, shooting pain to LUQ, radiating to RUQ, RLQ, right flank, back.  Worsened by fried or greasy food certain positions.  Improved by acid reflux med, oral narcotics soothe temporarily, sitting certain positions helps.  Associated with nausea, constipation, difficult to bend over, not able to work trying to get disability.  Pain >10/10.  Recently saw Dr. Christella Hartigan (GI) who mentioned 1.7 cm cystic lesion noted on [place] no FNA (12/2011) obtained unlikely malignancy repeat MRI 12/2012.  Dr. 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  Patient seeks narcotics continuously.  He goes to [facility].   C/o prostate problems but has not follow up with urology as recommended.   C/o hepatitis C lost to follow up and needs a new GI/hepatologist.    Abdominal Pain This is a chronic problem. The current episode started more than 1 month ago. The onset quality is gradual. The problem occurs constantly. The problem has been gradually worsening. The pain is located in the LUQ, RLQ and RUQ. The pain is at a severity of 10/10. The pain is severe. The quality of the pain is aching. The abdominal pain radiates to the LUQ, RUQ, RLQ, back and right flank. Associated symptoms include constipation and nausea. Pertinent negatives include no diarrhea, fever, vomiting or weight loss. The pain is aggravated by movement (fried or greasy food ). Relieved by: antiacid, sitting in certain positions and Rx pain medication  He has tried antacids and oral narcotic analgesics for the symptoms. The treatment provided mild relief. Prior diagnostic workup includes ultrasound and CT scan (MRI).  His past medical history is significant for GERD.      Review of Systems  Constitutional: Negative for fever, weight loss and unexpected weight change.  Gastrointestinal: Positive for nausea, abdominal pain, constipation and abdominal distention. Negative for vomiting and diarrhea.  Genitourinary:       Irritation with urination at time Intermittent lymph node swelling in genital region       Objective:   Physical Exam  Nursing note and vitals reviewed. Constitutional: He is oriented to person, place, and time. He appears well-developed and well-nourished. He is cooperative. No distress.       Focused on pain  HENT:  Head: Normocephalic and atraumatic.  Mouth/Throat: Oropharynx is clear and moist and mucous membranes are normal. No oropharyngeal exudate.  Eyes: Conjunctivae normal are normal. Pupils are equal, round, and reactive to light. Right eye exhibits no discharge. Left eye exhibits no discharge. No scleral icterus.  Cardiovascular: Normal rate, regular rhythm, S1 normal and S2 normal.   No murmur heard. Pulmonary/Chest: Effort normal and breath sounds normal. No respiratory distress. He has no wheezes.  Abdominal: Soft. Bowel sounds are normal. He exhibits distension. There is tenderness in the right upper quadrant, right lower quadrant, epigastric area and left upper quadrant.       Abdominal ttp in regions but distractible at times uncertain if abdomen pain is exaggerated or true   Neurological: He is alert and oriented to person, place, and time. He has normal strength.  Skin: Skin is warm, dry and intact. No rash noted. He is  not diaphoretic.  Psychiatric: He has a normal mood and affect. His speech is normal and behavior is normal. Judgment and thought content normal. Cognition and memory are normal.       Focused on abdominal pain           Assessment & Plan:  Will refer to [facility] for GI f/u for Hepatitis C and Pancreatic cysts   Shirlee Latch MD [phone]

## 2012-02-26 NOTE — Assessment & Plan Note (Signed)
Will refer to [facility] to follow up with GI for eval and treatment

## 2012-02-26 NOTE — Assessment & Plan Note (Signed)
Needs to f/u with outpatient urology

## 2012-02-26 NOTE — Assessment & Plan Note (Addendum)
Noted on MRI and [place]. MRI with lobular cystic lesion in uncinate process ?etiology.  [place] 1.7 cm cystic lesion no FNA attempted Dr. Christella Hartigan thought less likely malignancy.  Recommend MRI 12/2012  Recently follow with Dr. Christella Hartigan (GI [facility]) who does not seem to think abdominal pain should be this significant Rx Norco 5-325 mg #30 no refills  Will need specialist to fill Rx for pain pills if deems appropriated  Will refer to [facility]

## 2012-02-29 ENCOUNTER — Telehealth: Payer: Self-pay | Admitting: *Deleted

## 2012-02-29 LAB — CANNABANOIDS (GC/LC/MS), URINE: THC-COOH (GC/LC/MS), ur confirm: >1000 ng/mL

## 2012-02-29 NOTE — Telephone Encounter (Signed)
Records  On patient were faxed to [facility] at [facility].  Telephone [phone] Fax - [phone] Attention Elease Hashimoto.  Angelina Ok, RN 02/29/2012 9:57 AM.

## 2012-03-01 LAB — OPIATES/OPIOIDS (LC/MS-MS)
Hydrocodone: 2589 ng/mL
Hydromorphone: 281 ng/mL
Norhydrocodone, Ur: 1152 ng/mL

## 2012-03-01 LAB — COCAINE METABOLITE (GC/LC/MS), URINE: Benzoylecgonine GC/MS Conf: 237 ng/mL — ABNORMAL HIGH

## 2012-03-24 ENCOUNTER — Other Ambulatory Visit: Payer: Self-pay | Admitting: *Deleted

## 2012-03-24 MED ORDER — ALBUTEROL SULFATE HFA 108 (90 BASE) MCG/ACT IN AERS: 2.0000 | INHALATION_SPRAY | RESPIRATORY_TRACT | Status: DC | PRN

## 2012-03-26 ENCOUNTER — Encounter (HOSPITAL_COMMUNITY): Payer: Self-pay | Admitting: *Deleted

## 2012-03-26 ENCOUNTER — Emergency Department (INDEPENDENT_AMBULATORY_CARE_PROVIDER_SITE_OTHER): Payer: No Typology Code available for payment source

## 2012-03-26 ENCOUNTER — Emergency Department (INDEPENDENT_AMBULATORY_CARE_PROVIDER_SITE_OTHER)
Admission: EM | Admit: 2012-03-26 | Discharge: 2012-03-26 | Disposition: A | Discharge status: Home / Self Care | Payer: Self-pay | Source: Home / Self Care

## 2012-03-26 DIAGNOSIS — S161XXA Strain of muscle, fascia and tendon at neck level, initial encounter: Secondary | ICD-10-CM

## 2012-03-26 DIAGNOSIS — S335XXA Sprain of ligaments of lumbar spine, initial encounter: Secondary | ICD-10-CM

## 2012-03-26 DIAGNOSIS — S139XXA Sprain of joints and ligaments of unspecified parts of neck, initial encounter: Secondary | ICD-10-CM

## 2012-03-26 DIAGNOSIS — S39012A Strain of muscle, fascia and tendon of lower back, initial encounter: Secondary | ICD-10-CM

## 2012-03-26 DIAGNOSIS — M542 Cervicalgia: Secondary | ICD-10-CM | POA: Insufficient documentation

## 2012-03-26 MED ORDER — TRAMADOL HCL 50 MG PO TABS: 50.0000 mg | ORAL_TABLET | Freq: Four times a day (QID) | ORAL | Status: DC | PRN

## 2012-03-26 MED ORDER — HYDROCODONE-ACETAMINOPHEN 5-325 MG PO TABS
ORAL_TABLET | ORAL | Status: AC
  Filled 2012-03-26: qty 1

## 2012-03-26 MED ORDER — HYDROCODONE-ACETAMINOPHEN 5-325 MG PO TABS: 1.0000 | ORAL_TABLET | ORAL | Status: DC | PRN

## 2012-03-26 MED ORDER — DIAZEPAM 5 MG/ML IJ SOLN: 10.0000 mg | Freq: Once | INTRAMUSCULAR | Status: DC

## 2012-03-26 MED ORDER — HYDROCODONE-ACETAMINOPHEN 5-325 MG PO TABS
1.0000 | ORAL_TABLET | Freq: Once | ORAL | Status: AC
  Administered 2012-03-26: 1 via ORAL

## 2012-03-26 MED ORDER — CYCLOBENZAPRINE HCL 10 MG PO TABS: 10.0000 mg | ORAL_TABLET | Freq: Once | ORAL | Status: DC

## 2012-03-26 MED ORDER — IBUPROFEN 800 MG PO TABS: 800.0000 mg | ORAL_TABLET | Freq: Once | ORAL | Status: DC

## 2012-03-26 MED ORDER — CYCLOBENZAPRINE HCL 10 MG PO TABS: 10.0000 mg | ORAL_TABLET | Freq: Three times a day (TID) | ORAL | Status: DC | PRN
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
## 2012-03-27 NOTE — ED Provider Notes (Signed)
  I have directly reviewed the clinical findings, lab, imaging studies and management of this patient in detail. I  agree with the documentation,  as recorded by the Physician extender.  SINGH,PRASHANT K M.D on 03/27/2012 at 12:56 PM  [facility] Office  [phone] 

## 2012-03-28 NOTE — Telephone Encounter (Signed)
Called to pharm 

## 2012-04-05 ENCOUNTER — Ambulatory Visit (INDEPENDENT_AMBULATORY_CARE_PROVIDER_SITE_OTHER): Payer: No Typology Code available for payment source | Admitting: Internal Medicine

## 2012-04-05 ENCOUNTER — Encounter: Payer: No Typology Code available for payment source | Admitting: Radiation Oncology

## 2012-04-05 ENCOUNTER — Telehealth: Payer: Self-pay | Admitting: *Deleted

## 2012-04-05 VITALS — BP 160/96 | HR 82 | Temp 98.1°F | Wt 183.1 lb

## 2012-04-05 DIAGNOSIS — F1410 Cocaine abuse, uncomplicated: Secondary | ICD-10-CM

## 2012-04-05 DIAGNOSIS — M545 Low back pain: Secondary | ICD-10-CM | POA: Insufficient documentation

## 2012-04-05 MED ORDER — HYDROCODONE-ACETAMINOPHEN 5-325 MG PO TABS: 1.0000 | ORAL_TABLET | Freq: Four times a day (QID) | ORAL | Status: DC | PRN

## 2012-04-05 MED ORDER — CYCLOBENZAPRINE HCL 10 MG PO TABS: 10.0000 mg | ORAL_TABLET | Freq: Three times a day (TID) | ORAL | Status: DC | PRN
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
## 2012-04-05 NOTE — Telephone Encounter (Signed)
Pt calls and states he needs refills on pain med and muscle relaxants given for AA on 3/8, he is scheduled appt w/ dr Lavena Bullion for 1545 today
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
## 2012-04-05 NOTE — Patient Instructions (Addendum)
Back Pain, Adult Low back pain is very common. About 1 in 5 people have back pain.The cause of low back pain is rarely dangerous. The pain often gets better over time.About half of people with a sudden onset of back pain feel better in just 2 weeks. About 8 in 10 people feel better by 6 weeks.  CAUSES Some common causes of back pain include:  Strain of the muscles or ligaments supporting the spine.  Wear and tear (degeneration) of the spinal discs.  Arthritis.  Direct injury to the back. DIAGNOSIS Most of the time, the direct cause of low back pain is not known.However, back pain can be treated effectively even when the exact cause of the pain is unknown.Answering your caregiver's questions about your overall health and symptoms is one of the most accurate ways to make sure the cause of your pain is not dangerous. If your caregiver needs more information, he or she may order lab work or imaging tests (X-rays or MRIs).However, even if imaging tests show changes in your back, this usually does not require surgery. HOME CARE INSTRUCTIONS For many people, back pain returns.Since low back pain is rarely dangerous, it is often a condition that people can learn to manageon their own.   Remain active. It is stressful on the back to sit or stand in one place. Do not sit, drive, or stand in one place for more than 30 minutes at a time. Take short walks on level surfaces as soon as pain allows.Try to increase the length of time you walk each day.  Do not stay in bed.Resting more than 1 or 2 days can delay your recovery.  Do not avoid exercise or work.Your body is made to move.It is not dangerous to be active, even though your back may hurt.Your back will likely heal faster if you return to being active before your pain is gone.  Pay attention to your body when you bend and lift. Many people have less discomfortwhen lifting if they bend their knees, keep the load close to their bodies,and  avoid twisting. Often, the most comfortable positions are those that put less stress on your recovering back.  Find a comfortable position to sleep. Use a firm mattress and lie on your side with your knees slightly bent. If you lie on your back, put a pillow under your knees.  Only take over-the-counter or prescription medicines as directed by your caregiver. Over-the-counter medicines to reduce pain and inflammation are often the most helpful.Your caregiver may prescribe muscle relaxant drugs.These medicines help dull your pain so you can more quickly return to your normal activities and healthy exercise.  Put ice on the injured area.  Put ice in a plastic bag.  Place a towel between your skin and the bag.  Leave the ice on for 15 to 20 minutes, 3 to 4 times a day for the first 2 to 3 days. After that, ice and heat may be alternated to reduce pain and spasms.  Ask your caregiver about trying back exercises and gentle massage. This may be of some benefit.  Avoid feeling anxious or stressed.Stress increases muscle tension and can worsen back pain.It is important to recognize when you are anxious or stressed and learn ways to manage it.Exercise is a great option. SEEK MEDICAL CARE IF:  You have pain that is not relieved with rest or medicine.  You have pain that does not improve in 1 week.  You have new symptoms.  You are generally   not feeling well. SEEK IMMEDIATE MEDICAL CARE IF:   You have pain that radiates from your back into your legs.  You develop new bowel or bladder control problems.  You have unusual weakness or numbness in your arms or legs.  You develop nausea or vomiting.  You develop abdominal pain.  You feel faint. Document Released: 01/05/2005 Document Revised: 07/07/2011 Document Reviewed: 05/26/2010 [employer] Patient Information 2013 [employer].  

## 2012-04-06 ENCOUNTER — Telehealth: Payer: Self-pay | Admitting: Internal Medicine

## 2012-04-06 LAB — PRESCRIPTION ABUSE MONITORING 15P, URINE
Barbiturate Screen, Urine: NEGATIVE ng/mL
Benzodiazepine Screen, Urine: NEGATIVE ng/mL
Carisoprodol, Urine: NEGATIVE ng/mL
Fentanyl, Ur: NEGATIVE ng/mL
Meperidine, Ur: NEGATIVE ng/mL
Tramadol Scrn, Ur: NEGATIVE ng/mL
Zolpidem, Urine: NEGATIVE ng/mL

## 2012-04-06 NOTE — Telephone Encounter (Signed)
Spoke with patient he has an appointment to follow up with [facility] 04/07/12.  He will keep his appt with me tomorrow 04/07/12.  Advised we will not prescribe long term pain medications due to UDS + 2/14 for cocaine and marijuana.  He denies cocaine but admits marijuana use.   Shirlee Latch MD [phone]

## 2012-04-07 ENCOUNTER — Encounter: Payer: No Typology Code available for payment source | Admitting: Internal Medicine

## 2012-04-07 LAB — OPIATES/OPIOIDS (LC/MS-MS)
Heroin (6-AM), UR: NEGATIVE ng/mL
Hydrocodone: NEGATIVE ng/mL
Morphine Urine: NEGATIVE ng/mL
Noroxycodone, Ur: NEGATIVE ng/mL
Oxycodone, ur: NEGATIVE ng/mL
Oxymorphone: NEGATIVE ng/mL

## 2012-04-07 LAB — CANNABANOIDS (GC/LC/MS), URINE: THC-COOH (GC/LC/MS), ur confirm: >1000 ng/mL

## 2012-04-11 ENCOUNTER — Other Ambulatory Visit: Payer: Self-pay | Admitting: *Deleted

## 2012-04-11 ENCOUNTER — Encounter: Payer: Self-pay | Admitting: Internal Medicine

## 2012-04-11 MED ORDER — HYDROCHLOROTHIAZIDE 25 MG PO TABS: 25.0000 mg | ORAL_TABLET | Freq: Every day | ORAL | Status: DC

## 2012-04-13 ENCOUNTER — Other Ambulatory Visit: Payer: Self-pay | Admitting: *Deleted

## 2012-04-14 ENCOUNTER — Encounter: Payer: Self-pay | Admitting: Internal Medicine

## 2012-04-14 ENCOUNTER — Ambulatory Visit (INDEPENDENT_AMBULATORY_CARE_PROVIDER_SITE_OTHER): Payer: No Typology Code available for payment source | Admitting: Internal Medicine

## 2012-04-14 ENCOUNTER — Telehealth: Payer: Self-pay | Admitting: Internal Medicine

## 2012-04-14 VITALS — BP 145/94 | HR 93 | Temp 99.2°F | Ht 68.5 in | Wt 178.3 lb

## 2012-04-14 DIAGNOSIS — R768 Other specified abnormal immunological findings in serum: Secondary | ICD-10-CM

## 2012-04-14 DIAGNOSIS — N419 Inflammatory disease of prostate, unspecified: Secondary | ICD-10-CM

## 2012-04-14 DIAGNOSIS — N411 Chronic prostatitis: Secondary | ICD-10-CM

## 2012-04-14 DIAGNOSIS — M542 Cervicalgia: Secondary | ICD-10-CM

## 2012-04-14 DIAGNOSIS — K863 Pseudocyst of pancreas: Secondary | ICD-10-CM

## 2012-04-14 DIAGNOSIS — I10 Essential (primary) hypertension: Secondary | ICD-10-CM

## 2012-04-14 DIAGNOSIS — K862 Cyst of pancreas: Secondary | ICD-10-CM

## 2012-04-14 DIAGNOSIS — B1920 Unspecified viral hepatitis C without hepatic coma: Secondary | ICD-10-CM
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
## 2012-04-17 ENCOUNTER — Encounter: Payer: Self-pay | Admitting: Internal Medicine

## 2012-04-17 MED ORDER — AMLODIPINE BESYLATE 10 MG PO TABS: 10.0000 mg | ORAL_TABLET | Freq: Every day | ORAL | Status: DC
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
## 2012-04-17 NOTE — Assessment & Plan Note (Signed)
Will refer back to [facility] urology for f/u and further management.  He missed his appointment he was previously referred and never rescheduled.

## 2012-04-17 NOTE — Assessment & Plan Note (Signed)
Will refer to [facility] urology.  Previous following with Dr. Tora Duck (urology)

## 2012-04-17 NOTE — Assessment & Plan Note (Signed)
Previously seeing Dr. Jacqualine Mau.  Reviewed previous notes from Dr. Jacqualine Mau.  Patient has stage 1 B scarring.  He has not taken any medication.  Advised patient to follow up with [facility] GI for further hepatitis treatment

## 2012-04-17 NOTE — Assessment & Plan Note (Signed)
-  Patient seen and evaluated 3/24 at [facility].   He states abdominal pain is much improved since biopsy, FNA, and injection of Cipro into pancreatic pseudocyst.  Abdominal pain is now 6-7/10 without radiation as previously did.  Reviewed [facility] notes.  5cc of clear colorless fluid was evaluated with nondiagnostic results due to scant cellularity.  CEA 554, amylase >12K. EUS with 11.6 X 15 mm cyst with septations in the head ofthe pancreas.  Differential diagnoses include side branch IPMN and serous cystadenoma.  He was given Ciprofloxacin 500mg  Bid for 5 days.  It is recommended he have repeat cross-sectional abdominal imaging in 1 year from 04/11/12  -will not Rx narcotics for abdominal pain as patient has red flags of UDS + (marijuana and cocaine)

## 2012-04-17 NOTE — Assessment & Plan Note (Addendum)
Blood pressure slightly elevated today Will continue current therapy  BP Readings from Last 3 Encounters:  04/14/12 145/94  04/05/12 160/96  03/26/12 146/63    Lab Results  Component Value Date   NA 137 02/25/2012   K 3.8 02/25/2012   CREATININE 1.20 02/25/2012    Assessment:  Blood pressure control: mildly elevated  Progress toward BP goal:  unable to assess  Comments: Continue current treatment  Plan:  Medications:  continue current medications  Educational resources provided: handout  Self management tools provided: instructions for home blood pressure monitoring  Other plans: none

## 2012-04-18 ENCOUNTER — Other Ambulatory Visit: Payer: Self-pay | Admitting: *Deleted

## 2012-04-18 MED ORDER — RANITIDINE HCL 300 MG PO CAPS: 300.0000 mg | ORAL_CAPSULE | Freq: Every day | ORAL | Status: DC

## 2012-04-26 ENCOUNTER — Ambulatory Visit: Payer: No Typology Code available for payment source | Admitting: Physical Therapy

## 2012-04-28 ENCOUNTER — Ambulatory Visit: Payer: No Typology Code available for payment source | Attending: Internal Medicine | Admitting: Physical Therapy

## 2012-04-28 DIAGNOSIS — M25619 Stiffness of unspecified shoulder, not elsewhere classified: Secondary | ICD-10-CM | POA: Insufficient documentation

## 2012-04-28 DIAGNOSIS — M542 Cervicalgia: Secondary | ICD-10-CM | POA: Insufficient documentation

## 2012-04-28 DIAGNOSIS — IMO0001 Reserved for inherently not codable concepts without codable children: Secondary | ICD-10-CM | POA: Insufficient documentation

## 2012-05-04 ENCOUNTER — Ambulatory Visit: Payer: No Typology Code available for payment source | Admitting: Physical Therapy

## 2012-05-05 ENCOUNTER — Ambulatory Visit: Payer: No Typology Code available for payment source | Admitting: Physical Therapy

## 2012-05-09 ENCOUNTER — Ambulatory Visit: Payer: No Typology Code available for payment source | Admitting: Physical Therapy

## 2012-05-11 NOTE — Telephone Encounter (Signed)
Entered in error

## 2012-05-12 ENCOUNTER — Ambulatory Visit: Payer: No Typology Code available for payment source | Admitting: Physical Therapy

## 2012-05-27 ENCOUNTER — Inpatient Hospital Stay (HOSPITAL_COMMUNITY): Payer: Self-pay

## 2012-05-27 ENCOUNTER — Other Ambulatory Visit: Payer: Self-pay

## 2012-05-27 ENCOUNTER — Encounter (HOSPITAL_COMMUNITY): Payer: Self-pay

## 2012-05-27 ENCOUNTER — Inpatient Hospital Stay (HOSPITAL_COMMUNITY)
Admission: EM | Admit: 2012-05-27 | Discharge: 2012-06-01 | DRG: 439 | Disposition: A | Discharge status: Home / Self Care | Payer: MEDICAID | Attending: Internal Medicine | Admitting: Internal Medicine

## 2012-05-27 DIAGNOSIS — E860 Dehydration: Secondary | ICD-10-CM | POA: Diagnosis present

## 2012-05-27 DIAGNOSIS — F17200 Nicotine dependence, unspecified, uncomplicated: Secondary | ICD-10-CM | POA: Diagnosis present

## 2012-05-27 DIAGNOSIS — K859 Acute pancreatitis without necrosis or infection, unspecified: Principal | ICD-10-CM | POA: Diagnosis present

## 2012-05-27 DIAGNOSIS — B1920 Unspecified viral hepatitis C without hepatic coma: Secondary | ICD-10-CM | POA: Diagnosis present

## 2012-05-27 DIAGNOSIS — K219 Gastro-esophageal reflux disease without esophagitis: Secondary | ICD-10-CM | POA: Diagnosis present

## 2012-05-27 DIAGNOSIS — F1010 Alcohol abuse, uncomplicated: Secondary | ICD-10-CM | POA: Diagnosis present

## 2012-05-27 DIAGNOSIS — I10 Essential (primary) hypertension: Secondary | ICD-10-CM | POA: Diagnosis present

## 2012-05-27 DIAGNOSIS — K862 Cyst of pancreas: Secondary | ICD-10-CM | POA: Diagnosis present

## 2012-05-27 HISTORY — DX: Shortness of breath: R06.02

## 2012-05-27 HISTORY — DX: Inflammatory disease of prostate, unspecified: N41.9

## 2012-05-27 HISTORY — DX: Personal history of peptic ulcer disease: Z87.11

## 2012-05-27 HISTORY — DX: Unspecified viral hepatitis C without hepatic coma: B19.20

## 2012-05-27 HISTORY — DX: Personal history of other diseases of the digestive system: Z87.19

## 2012-05-27 HISTORY — DX: Angina pectoris, unspecified: I20.9

## 2012-05-27 HISTORY — DX: Other chronic pain: G89.29

## 2012-05-27 HISTORY — DX: Hypoglycemia, unspecified: E16.2

## 2012-05-27 HISTORY — DX: Dorsalgia, unspecified: M54.9

## 2012-05-27 HISTORY — DX: Family history of other specified conditions: Z84.89

## 2012-05-27 HISTORY — DX: Cervicalgia: M54.2

## 2012-05-27 LAB — CBC WITH DIFFERENTIAL/PLATELET
Basophils Absolute: 0 10*3/uL (ref 0.0–0.1)
Basophils Relative: 0 % (ref 0–1)
Eosinophils Absolute: 0.1 10*3/uL (ref 0.0–0.7)
Hemoglobin: 17 g/dL (ref 13.0–17.0)
MCH: 32.7 pg (ref 26.0–34.0)
MCHC: 36.4 g/dL — ABNORMAL HIGH (ref 30.0–36.0)
Monocytes Relative: 7 % (ref 3–12)
Neutrophils Relative %: 55 % (ref 43–77)
RDW: 13 % (ref 11.5–15.5)

## 2012-05-27 LAB — POCT I-STAT, CHEM 8
BUN: 11 mg/dL (ref 6–23)
Hemoglobin: 17.7 g/dL — ABNORMAL HIGH (ref 13.0–17.0)
Sodium: 140 mEq/L (ref 135–145)
TCO2: 26 mmol/L (ref 0–100)

## 2012-05-27 LAB — RAPID URINE DRUG SCREEN, HOSP PERFORMED
Cocaine: NOT DETECTED
Opiates: NOT DETECTED

## 2012-05-27 LAB — HEPATIC FUNCTION PANEL
Alkaline Phosphatase: 106 U/L (ref 39–117)
Indirect Bilirubin: 0.8 mg/dL (ref 0.3–0.9)
Total Bilirubin: 1 mg/dL (ref 0.3–1.2)
Total Protein: 7.8 g/dL (ref 6.0–8.3)

## 2012-05-27 MED ORDER — ONDANSETRON HCL 4 MG/2ML IJ SOLN
4.0000 mg | Freq: Four times a day (QID) | INTRAMUSCULAR | Status: DC | PRN
  Administered 2012-05-30: 4 mg via INTRAVENOUS
  Filled 2012-05-27: qty 2

## 2012-05-27 MED ORDER — MORPHINE SULFATE 4 MG/ML IJ SOLN
4.0000 mg | INTRAMUSCULAR | Status: DC | PRN
  Administered 2012-05-27 – 2012-05-28 (×3): 4 mg via INTRAVENOUS
  Filled 2012-05-27 (×3): qty 1

## 2012-05-27 MED ORDER — SODIUM CHLORIDE 0.9 % IV BOLUS (SEPSIS)
1000.0000 mL | Freq: Once | INTRAVENOUS | Status: AC
  Administered 2012-05-27: 1000 mL via INTRAVENOUS

## 2012-05-27 MED ORDER — HYDROMORPHONE HCL PF 1 MG/ML IJ SOLN
1.0000 mg | Freq: Once | INTRAMUSCULAR | Status: AC
  Administered 2012-05-27: 1 mg via INTRAVENOUS
  Filled 2012-05-27: qty 1

## 2012-05-27 MED ORDER — IOHEXOL 300 MG/ML  SOLN
100.0000 mL | Freq: Once | INTRAMUSCULAR | Status: AC | PRN
  Administered 2012-05-27: 100 mL via INTRAVENOUS

## 2012-05-27 MED ORDER — IOHEXOL 300 MG/ML  SOLN
25.0000 mL | INTRAMUSCULAR | Status: AC
  Administered 2012-05-27 (×2): 25 mL via ORAL

## 2012-05-27 MED ORDER — ONDANSETRON HCL 4 MG/2ML IJ SOLN
4.0000 mg | Freq: Once | INTRAMUSCULAR | Status: AC
  Administered 2012-05-27: 4 mg via INTRAVENOUS
  Filled 2012-05-27: qty 2

## 2012-05-27 MED ORDER — ENOXAPARIN SODIUM 40 MG/0.4ML ~~LOC~~ SOLN
40.0000 mg | SUBCUTANEOUS | Status: DC
  Administered 2012-05-27 – 2012-05-31 (×5): 40 mg via SUBCUTANEOUS
  Filled 2012-05-27 (×7): qty 0.4

## 2012-05-27 MED ORDER — HYDROCHLOROTHIAZIDE 25 MG PO TABS
25.0000 mg | ORAL_TABLET | Freq: Every day | ORAL | Status: DC
  Administered 2012-05-27: 25 mg via ORAL
  Filled 2012-05-27: qty 1

## 2012-05-27 MED ORDER — PANTOPRAZOLE SODIUM 40 MG PO TBEC
40.0000 mg | DELAYED_RELEASE_TABLET | Freq: Every day | ORAL | Status: DC
  Administered 2012-05-27 – 2012-05-31 (×5): 40 mg via ORAL
  Filled 2012-05-27 (×5): qty 1

## 2012-05-27 MED ORDER — MORPHINE SULFATE 2 MG/ML IJ SOLN
1.0000 mg | INTRAMUSCULAR | Status: DC | PRN
  Administered 2012-05-27 (×2): 1 mg via INTRAVENOUS
  Filled 2012-05-27 (×2): qty 1

## 2012-05-27 MED ORDER — SODIUM CHLORIDE 0.9 % IV SOLN
INTRAVENOUS | Status: DC
  Administered 2012-05-27 – 2012-05-28 (×6): via INTRAVENOUS

## 2012-05-27 MED ORDER — AMLODIPINE BESYLATE 10 MG PO TABS
10.0000 mg | ORAL_TABLET | Freq: Every day | ORAL | Status: DC
  Administered 2012-05-27 – 2012-05-31 (×4): 10 mg via ORAL
  Filled 2012-05-27 (×6): qty 1

## 2012-05-27 MED ORDER — ALUM & MAG HYDROXIDE-SIMETH 200-200-20 MG/5ML PO SUSP: 30.0000 mL | Freq: Four times a day (QID) | ORAL | Status: DC | PRN

## 2012-05-27 MED ORDER — ONDANSETRON HCL 4 MG PO TABS: 4.0000 mg | ORAL_TABLET | Freq: Four times a day (QID) | ORAL | Status: DC | PRN

## 2012-05-27 NOTE — Progress Notes (Signed)
Nutrition Brief Note  Patient identified on the Malnutrition Screening Tool (MST) Report  Body mass index is 26.97 kg/(m^2). Patient meets criteria for overweight based on current BMI.   Current diet order is NPO for pancreatitis. Labs and medications reviewed.   Pt denies wt loss or poor appetite.  Reports hunger, and asks about food/diet advancement during visit with RD.  No nutrition interventions warranted at this time. If nutrition issues arise, please consult RD.   Loyce Dys, MS RD LDN Clinical Inpatient Dietitian Pager: [phone] Weekend/After hours pager: [phone]

## 2012-05-27 NOTE — ED Provider Notes (Signed)
History     CSN: [id number]  Arrival date & time 05/27/12  0545   First MD Initiated Contact with Patient 05/27/12 0630      Chief Complaint  Patient presents with  . Abdominal Pain    (Consider location/radiation/quality/duration/timing/severity/associated sxs/prior treatment) HPI Comments: Patient with past history of pancreatitic pseudocysts, recent drainage of pseudocyst at [facility] (~2 months ago), hepatitis -- presents with acute onset of severe left upper quadrant pain that is described as sharp and shooting and radiates to his back with associated vomiting this morning after waking up. Patient denies fever, chest pain, cough, shortness of breath, diarrhea. Urinary symptoms at baseline. No lower extremity edema.   The history is provided by the patient.    Past Medical History  Diagnosis Date  . Hypertension   . GERD (gastroesophageal reflux disease)   . Prostatitis   . Neck pain   . Back pain     Past Surgical History  Procedure Laterality Date  . Pancreas surgery    . Hernia repair      inguinal  . Liver biopsy    . Cardiac catheterization      Family History  Problem Relation Age of Onset  . Heart attack Mother   . CVA Mother   . Hypertension Mother   . Cancer Father   . Diabetes type I Sister     History  Substance Use Topics  . Smoking status: Current Some Day Smoker -- 0.15 packs/day for 29 years    Types: Cigarettes  . Smokeless tobacco: Never Used  . Alcohol Use: Not on file      Review of Systems  Constitutional: Negative for fever.  HENT: Negative for sore throat and rhinorrhea.   Eyes: Negative for redness.  Respiratory: Negative for cough.   Cardiovascular: Negative for chest pain.  Gastrointestinal: Positive for nausea, vomiting and abdominal pain. Negative for diarrhea and blood in stool.  Genitourinary: Negative for dysuria.  Musculoskeletal: Negative for myalgias.  Skin: Negative for rash.  Neurological: Negative  for headaches.    Allergies  Benadryl; Nsaids; and Oxycodone  Home Medications   Current Outpatient Rx  Name  Route  Sig  Dispense  Refill  . amLODipine (NORVASC) 10 MG tablet   Oral   Take 10 mg by mouth daily.         . hydrochlorothiazide (HYDRODIURIL) 25 MG tablet   Oral   Take 25 mg by mouth daily.         . ranitidine (ZANTAC) 150 MG tablet   Oral   Take 150 mg by mouth 2 (two) times daily.           BP 139/87  Pulse 79  Temp(Src) 98.3 F (36.8 C) (Oral)  Resp 16  Ht 5' 8.5" (1.74 m)  Wt 180 lb (81.647 kg)  BMI 26.97 kg/m2  SpO2 100%  Physical Exam  Nursing note and vitals reviewed. Constitutional: He appears well-developed and well-nourished.  HENT:  Head: Normocephalic and atraumatic.  Eyes: Conjunctivae are normal. Right eye exhibits no discharge. Left eye exhibits no discharge.  Neck: Normal range of motion. Neck supple.  Cardiovascular: Normal rate, regular rhythm and normal heart sounds.   Pulmonary/Chest: Effort normal and breath sounds normal.  Abdominal: Soft. He exhibits distension. Bowel sounds are decreased. There is tenderness. There is guarding. There is no rebound.  Neurological: He is alert.  Skin: Skin is warm and dry.  Psychiatric: He has a normal mood and affect.  ED Course  Procedures (including critical care time)  Labs Reviewed  CBC WITH DIFFERENTIAL - Abnormal; Notable for the following:    MCHC 36.4 (*)    All other components within normal limits  POCT I-STAT, CHEM 8 - Abnormal; Notable for the following:    Glucose, Bld 127 (*)    Hemoglobin 17.7 (*)    All other components within normal limits  LIPASE, BLOOD  HEPATIC FUNCTION PANEL   No results found.   1. Pancreatitis     6:49 AM Patient seen and examined. Work-up initiated. Medications ordered.   Vital signs reviewed and are as follows: Filed Vitals:   05/27/12 0553  BP: 139/87  Pulse: 79  Temp: 98.3 F (36.8 C)  Resp: 16   7:45 AM D/w Dr.  Karma Ganja. Spoke with [facility] resident in ED. They will admit. Exam unchanged but patient is feeling better after dilaudid and zofran. Fluids running.    MDM  Patient with pancreatitis complicated by pseudocyst and recent drainage.         Renne Crigler, PA-C 05/27/12 [phone]

## 2012-05-27 NOTE — ED Notes (Signed)
The patient states that he has been experiencing upper abdominal pain radiating through to his back x 1 hour.  He states that he has had problems with his pancreas in the past and that this pain feels similar.

## 2012-05-27 NOTE — ED Provider Notes (Signed)
Medical screening examination/treatment/procedure(s) were performed by non-physician practitioner and as supervising physician I was immediately available for consultation/collaboration.  Ethelda Chick, MD 05/27/12 [phone]

## 2012-05-27 NOTE — H&P (Signed)
Hospital Admission Note Date: 05/27/2012  Patient name: Jerry Terrell Medical record number: [id number] Date of birth: 1960-10-11 Age: 52 y.o. 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  Patient reports that he has been having some intermittent abdominal pain for last 1 month that has been aggressively getting worse for last few weeks. He states that he started feeling nauseous about a week ago that he thought could be related to "stomach bug" and did not pay much attention. He woke up this morning to drop his wife to the work and started having excruciating abdominal pain and an episode of vomiting that prompted him to come to the ER. He rates his pain 10 out of 10, located in the epigastrium and left upper quadrant with radiation to the back. He describes his pain as sharp and shooting discomfort. His vomitus was nonbilious and nonbloody. Denies any diarrhea, fever, chills, cough or shortness of breath . He states that he drinks a glass of wine every day and smoke marijuana and cigarettes.   Patient has this history of pancreatic cyst that has been present for ~ 6 years and he underwent EUS guided biopsy, FNA, and injection of Cipro into pancreatic pseudocyst at [facility] on 04/14/12. Reviewed notes. 5cc of clear colorless fluid was evaluated with nondiagnostic results due to scant cellularity. CEA 554, amylase  >12K. EUS with 11.6 X 15 mm cyst with septations in the head ofthe pancreas   Meds: Current Outpatient Rx  Name  Route  Sig  Dispense  Refill  . amLODipine (NORVASC) 10 MG tablet   Oral   Take 10 mg by mouth daily.         . hydrochlorothiazide (HYDRODIURIL) 25 MG tablet   Oral   Take 25 mg by mouth daily.         . ranitidine (ZANTAC) 150 MG tablet   Oral   Take 150 mg by mouth 2 (two) times daily.           Allergies: Allergies as of 05/27/2012 - Review Complete 05/27/2012  Allergen Reaction Noted  . Benadryl (diphenhydramine) Other (See Comments) 05/27/2012  . Nsaids Other (See Comments) 05/27/2012  . Oxycodone Hives, Itching, and Other (See Comments) 05/27/2012   Past Medical History  Diagnosis Date  . Hypertension   . GERD (gastroesophageal reflux disease)   . Prostatitis   . Neck pain   . Back pain    Past Surgical History  Procedure Laterality Date  . Pancreas surgery    . Hernia repair      inguinal  . Liver biopsy    . Cardiac catheterization     Family History  Problem Relation Age of Onset  . Heart attack Mother   . CVA Mother   . Hypertension Mother   . Cancer Father   . Diabetes type I Sister    History  Social History  . Marital Status: Single    Spouse Name: N/A    Number of Children: N/A  . Years of Education: N/A   Occupational History  . Not on file.   Social History Main Topics  . Smoking status: Current Some Day Smoker -- 0.15 packs/day for 29 years    Types: Cigarettes  . Smokeless tobacco: Never Used  . Alcohol Use: Not on file  . Drug Use: No  . Sexually Active: Not Currently   Other Topics Concern  . Not on file   Social History Narrative  . No narrative on file    Review of Systems: Constitutional: negative for anorexia, chills, fatigue, fevers and malaise HEENT: negative for irritation and redness, ear pain or discahrge.  Respiratory: negative for cough, sputum, stridor and wheezing Cardiovascular:  negative for chest pain, exertional chest pressure/discomfort, fatigue, irregular heart beat, palpitations and syncope Gastrointestinal: Negative for  constipation, dysphagia, jaundice, melena, nausea, reflux symptoms . Positive for abdominal pain ,  vomiting Musculoskeletal:Negative for bone pain, myalgias and neck pain Neurological: negative for dizziness, gait problems, seizures, tremors and vertigo  Physical Exam: Blood pressure 111/68, pulse 66, temperature 98.3 F (36.8 C), temperature source Oral, resp. rate 16, height 5' 8.5" (1.74 m), weight 180 lb (81.647 kg), SpO2 100.00%. BP 111/68  Pulse 66  Temp(Src) 98.3 F (36.8 C) (Oral)  Resp 16  Ht 5' 8.5" (1.74 m)  Wt 180 lb (81.647 kg)  BMI 26.97 kg/m2  SpO2 100%  General Appearance:    Alert, cooperative, no distress, appears stated age  Head:    Normocephalic, without obvious abnormality, atraumatic  Eyes:    PERRL, conjunctiva/corneas clear, EOM's intact, fundi    benign, both eyes       Ears:    Normal TM's and external ear canals, both ears  Nose:   Nares normal, septum midline, mucosa normal, no drainage    or sinus tenderness  Throat:   Lips, mucosa, and tongue normal; teeth and gums normal  Neck:   Supple, symmetrical, trachea midline, no adenopathy;       thyroid:  No enlargement/tenderness/nodules; no carotid   bruit or JVD  Back:     Symmetric, no curvature, ROM normal, no CVA tenderness  Lungs:     Clear to auscultation bilaterally, respirations unlabored  Chest wall:    No tenderness or deformity  Heart:    Regular rate and rhythm, S1 and S2 normal, no murmur, rub   or gallop  Abdomen:     Soft, tender to palpation in the epigastrium and LUQ, bowel sounds active all four quadrants,    no masses, no organomegaly  Genitalia:    Normal male without lesion, discharge or tenderness  Rectal:    Normal tone, normal prostate, no masses or tenderness;   guaiac negative stool  Extremities:   Extremities normal, atraumatic,  no cyanosis or edema  Pulses:   2+ and symmetric all extremities  Skin:   Skin color, texture, turgor normal, no rashes or lesions  Lymph nodes:   Cervical, supraclavicular, and axillary nodes normal  Neurologic:   CNII-XII intact. Normal strength, sensation and reflexes      throughout    Lab results: Basic Metabolic Panel:  Recent Labs  96/04/54 0634  NA 140  K 4.1  CL 106  GLUCOSE 127*  BUN 11  CREATININE 1.20   Liver Function Tests:  Recent Labs  05/27/12 0617  AST 29  ALT 38  ALKPHOS  106  BILITOT 1.0  PROT 7.8  ALBUMIN 4.3    Recent Labs  05/27/12 0617  LIPASE 2172*   CBC:  Recent Labs  05/27/12 0617 05/27/12 0634  WBC 8.1  --   NEUTROABS 4.4  --   HGB 17.0 17.7*  HCT 46.7 52.0  MCV 89.8  --   PLT 243  --      Imaging results:  No results found.  Other results: EKG: normal EKG, normal sinus rhythm, unchanged from previous tracings.  Assessment & Plan by Problem: Active Problems:   Acute pancreatitis   HTN (hypertension)  52 year old gentleman with past medical history significant for pancreatic cyst, hep C, hypertension presents to ED with worsening abdominal pain and vomiting for last 1-2 weeks.  # Acute Pancreatitis; Patient has a history of pancreatic cyst present ~ 6 years. Work up included EUS ultrasound in 12/13 at [facility] by Dr. Christella Hartigan , where cyst was determined to be benign in origin. Because of persistent abdominal pain , he underwent EUS guided biopsy at [facility] in 0 3/14. 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 Patient presents with worsening abdominal pain and vomiting. His lipase is noted to be 2100. Etiology of his acute pancreatitis flare is unclear but he does report drinking a glass of wine a day. Possibilities include alcohol  versus complications related to his pseudocyst ( like infection or bleeding ) vs symptoms related to his underlying ? Malignancy. His LFT's are WNL  and has no WBC on CBC that makes infectious process less likely.  -Admit to MedSurg bed. -Repeat CT abdomen pelvis with contrast to look for any complications related to his pancreatic pseudocyst.  -Check UDS -IV fluids -Morphine for pain -Zofran for nausea  # HTN; BP stable. Continue home meds.  # DVT: Lovenox   Dispo: Disposition is deferred at this time, awaiting improvement of current medical problems. Anticipated discharge in approximately 3- 4 day(s).   The patient does have a current PCP Shirlee Latch, Pasty Spillers, MD), therefore will be requiring OPC follow-up after discharge.   The patient does not have transportation limitations that hinder transportation to clinic appointments.  Signed: Renell Allum 05/27/2012, 10:21 AM

## 2012-05-28 LAB — COMPREHENSIVE METABOLIC PANEL
BUN: 7 mg/dL (ref 6–23)
CO2: 23 mEq/L (ref 19–32)
Calcium: 9.1 mg/dL (ref 8.4–10.5)
Chloride: 104 mEq/L (ref 96–112)
Creatinine, Ser: 1.26 mg/dL (ref 0.50–1.35)
GFR calc Af Amer: 73 mL/min — ABNORMAL LOW (ref >90)
GFR calc non Af Amer: 63 mL/min — ABNORMAL LOW (ref >90)
Total Bilirubin: 1.3 mg/dL — ABNORMAL HIGH (ref 0.3–1.2)

## 2012-05-28 LAB — CBC
HCT: 45.1 % (ref 39.0–52.0)
Hemoglobin: 15.6 g/dL (ref 13.0–17.0)
MCH: 31.3 pg (ref 26.0–34.0)
MCHC: 34.6 g/dL (ref 30.0–36.0)
MCV: 90.6 fL (ref 78.0–100.0)

## 2012-05-28 MED ORDER — DEXTROSE 5 % IV SOLN
INTRAVENOUS | Status: DC
  Administered 2012-05-28 – 2012-05-29 (×5): via INTRAVENOUS
  Filled 2012-05-28: qty 1000

## 2012-05-28 MED ORDER — MORPHINE SULFATE 4 MG/ML IJ SOLN
4.0000 mg | INTRAMUSCULAR | Status: DC | PRN
  Administered 2012-05-28 – 2012-05-30 (×13): 4 mg via INTRAVENOUS
  Filled 2012-05-28 (×13): qty 1

## 2012-05-28 NOTE — H&P (Signed)
Internal Medicine Attending Admission Note Date: 05/28/2012  Patient name: Jerry Terrell Medical record number: [id number] Date of birth: 12-27-1960 Age: 52 y.o. Gender: male  I saw and evaluated the patient. I reviewed the resident's note and I agree with the resident's findings and plan as documented in the resident's note.  Chief Complaint(s): Severe abdominal pain X 1 day.  History - key components related to admission:  Jerry Terrell is a 52 year old man who presents with a one-day history of severe abdominal pain when he awoke. The pain is epigastric and moves bilaterally around the flank to the back. It is associated with vomiting but there is no hematemesis. He denies any diarrhea, fevers, shakes, chills, chest pain, shortness of breath, or recent abdominal trauma. He has a history of previous pancreatitis felt to be secondary to alcohol abuse. 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 He currently claims to drink 1 glass of wine per day. He is without other complaints at this time.  Physical Exam - key components related to admission:  Filed Vitals:   05/27/12 1051 05/27/12 1326 05/27/12 2202 05/28/12 0521  BP: 144/76 133/78 129/83 125/84  Pulse: 63 66 73 65  Temp: 98.5 F (36.9 C) 98.2 F (36.8 C) 98.2 F (36.8 C) 98.4 F (36.9 C)  TempSrc: Oral Oral Oral   Resp: 15 19 18 19   Height:      Weight:      SpO2: 100% 100% 99% 100%   General: Well-developed, well-nourished, man lying comfortably in bed in no acute distress. Lungs: Clear to auscultation bilaterally without wheezes, rhonchi, or rales. Heart: Regular rate and rhythm without murmurs, rubs, or gallops. Abdomen: Epigastric, right upper quadrant, and left upper quadrant tenderness to palpation without rebound. There is no guarding. Bowel sounds are hypoactive. Extremities: Without edema.  Lab results:  Basic Metabolic Panel:  Recent Labs  40/98/11 0634 05/28/12 0520   NA 140 140  K 4.1 3.9  CL 106 104  CO2  --  23  GLUCOSE 127* 87  BUN 11 7  CREATININE 1.20 1.26  CALCIUM  --  9.1   Liver Function Tests:  Recent Labs  05/27/12 0617 05/28/12 0520  AST 29 22  ALT 38 31  ALKPHOS 106 87  BILITOT 1.0 1.3*  PROT 7.8 6.8  ALBUMIN 4.3 3.6    Recent Labs  05/27/12 0617  LIPASE 2172*   CBC:  Recent Labs  05/27/12 0617 05/27/12 0634 05/28/12 0520  WBC 8.1  --  6.4  NEUTROABS 4.4  --   --   HGB 17.0 17.7* 15.6  HCT 46.7 52.0 45.1  MCV 89.8  --  90.6  PLT 243  --  228   Urine Drug Screen:  THC: Positive  Imaging results:  Ct Abdomen Pelvis W Contrast  05/27/2012  *RADIOLOGY REPORT*  Clinical Data: 52 year old male with abdominal and pelvic pain. 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  CT ABDOMEN AND PELVIS WITH CONTRAST  Technique:  Multidetector CT imaging of the abdomen and pelvis was performed following the standard protocol during bolus administration of intravenous contrast.  Contrast: OMNIPAQUE IOHEXOL 300 MG/ML  SOLN  Comparison: None  Findings: The liver, spleen, kidneys, adrenal glands and gallbladder are unremarkable. A 1.3 x 1.9 cm cystic lesion with the pancreatic head (image 22) is identified. A 1.2 cm  rounded area at the tip of the pancreatic tail (image 18) has enhancement characteristics similar to the remainder of the pancreas - recommend comparison to prior outside studies.  No free fluid, enlarged lymph nodes, biliary dilation or abdominal aortic aneurysm identified.  The bowel, appendix and bladder are unremarkable. There is no evidence of bowel obstruction, abscess or pneumoperitoneum. Mild degenerative changes the hips and lumbar spine noted. No acute or suspicious bony abnormalities are identified.  IMPRESSION: No evidence of acute abnormality.  1.3 x 1.9 cm pancreatic head cystic  lesion - presumably the lesion recently biopsied by history, as well as a 1.2 cm rounded area at the tip of the pancreatic tail which may represent normal pancreatic tissue versus possible mass.  If outside studies cannot be obtained for comparison, recommend short-term interval evaluation if clinically indicated by the outside subpecialists following this patient.   Original Report Authenticated By: Harmon Pier, M.D.    Other results:  EKG: Normal sinus rhythm at 63 beats per minute, left axis deviation, no significant Q waves, no left ventricular hypertrophy by voltage, good R wave progression, no ST-T changes.  Assessment & Plan by Problem:  Jerry Terrell is a 52 year old man with a history of pancreatitis and a pancreatic head cyst who presents with the sudden onset of a, pain radiating to the back. The lipase was 2172 suggesting an acute on chronic pancreatitis. CT scan shows no evidence of necrosis and very little if any peripancreatic fat stranding.  1) Acute on chronic pancreatitis: We will keep Jerry Terrell n.p.o. to rest his pancreas given the severe pain. He is receiving intravenous fluids and IV morphine. Today he told me the morphine was not holding him the full 4 hours so I increased the frequency to every 3 hours. Once he has marked improvement, if not resolution of his pain, we will slowly advance his diet as tolerated. I suspect he may not be ready to eat for over 24 hours.  Please see Dr. Jory Ee history and physical note for the plan on management of his other chronic medical conditions.

## 2012-05-28 NOTE — Progress Notes (Signed)
Subjective: Still with some shooting abdominal pains this am but reports much improved from yesterday. Reports hunger. Some loose stool this am. Denies CP, SOB, N/V, dizziness  Objective: Vital signs in last 24 hours: Filed Vitals:   05/27/12 1051 05/27/12 1326 05/27/12 2202 05/28/12 0521  BP: 144/76 133/78 129/83 125/84  Pulse: 63 66 73 65  Temp: 98.5 F (36.9 C) 98.2 F (36.8 C) 98.2 F (36.8 C) 98.4 F (36.9 C)  TempSrc: Oral Oral Oral   Resp: 15 19 18 19   Height:      Weight:      SpO2: 100% 100% 99% 100%   Weight change:   Intake/Output Summary (Last 24 hours) at 05/28/12 1041 Last data filed at 05/28/12 0500  Gross per 24 hour  Intake 3577.58 ml  Output   3000 ml  Net 577.58 ml   Vitals reviewed. General: energetic male lying in bed, NAD HEENT: PERRL, EOMI, no scleral icterus Cardiac: RRR, no rubs, murmurs or gallops Pulm: clear to auscultation bilaterally, no wheezes, rales, or rhonchi Abd: TTP of epigastrum and LUQ, no rebound. Normoactive BS Ext: warm and well perfused, no pedal edema Neuro: alert and oriented X3, cranial nerves II-XII grossly intact, strength and sensation to light touch equal in bilateral upper and lower extremities  Lab Results: Basic Metabolic Panel:  Recent Labs Lab 05/27/12 0634 05/28/12 0520  NA 140 140  K 4.1 3.9  CL 106 104  CO2  --  23  GLUCOSE 127* 87  BUN 11 7  CREATININE 1.20 1.26  CALCIUM  --  9.1   Liver Function Tests:  Recent Labs Lab 05/27/12 0617 05/28/12 0520  AST 29 22  ALT 38 31  ALKPHOS 106 87  BILITOT 1.0 1.3*  PROT 7.8 6.8  ALBUMIN 4.3 3.6    Recent Labs Lab 05/27/12 0617  LIPASE 2172*   CBC:  Recent Labs Lab 05/27/12 0617 05/27/12 0634 05/28/12 0520  WBC 8.1  --  6.4  NEUTROABS 4.4  --   --   HGB 17.0 17.7* 15.6  HCT 46.7 52.0 45.1  MCV 89.8  --  90.6  PLT 243  --  228   Urine Drug Screen: Drugs of Abuse     Component Value Date/Time   LABOPIA NONE DETECTED 05/27/2012 0836    COCAINSCRNUR NONE DETECTED 05/27/2012 0836   LABBENZ NONE DETECTED 05/27/2012 0836   AMPHETMU NONE DETECTED 05/27/2012 0836   THCU POSITIVE* 05/27/2012 0836   LABBARB NONE DETECTED 05/27/2012 0836    Studies/Results: Ct Abdomen Pelvis W Contrast  05/27/2012  *RADIOLOGY REPORT*  Clinical Data: 52 year old male with abdominal and pelvic pain. History of pancreatic cysts within nondiagnostic biopsy performed at [facility] on 04/01/2012 - question serous cystadenoma vs intraductal papillary mucinous neoplasm.  CT ABDOMEN AND PELVIS WITH CONTRAST  Technique:  Multidetector CT imaging of the abdomen and pelvis was performed following the standard protocol during bolus administration of intravenous contrast.  Contrast: OMNIPAQUE IOHEXOL 300 MG/ML  SOLN  Comparison: None  Findings: The liver, spleen, kidneys, adrenal glands and gallbladder are unremarkable. A 1.3 x 1.9 cm cystic lesion with the pancreatic head (image 22) is identified. A 1.2 cm rounded area at the tip of the pancreatic tail (image 18) has enhancement characteristics similar to the remainder of the pancreas - recommend comparison to prior outside studies.  No free fluid, enlarged lymph nodes, biliary dilation or abdominal aortic aneurysm identified.  The bowel, appendix and bladder are unremarkable. There is no  evidence of bowel obstruction, abscess or pneumoperitoneum. Mild degenerative changes the hips and lumbar spine noted. No acute or suspicious bony abnormalities are identified.  IMPRESSION: No evidence of acute abnormality.  1.3 x 1.9 cm pancreatic head cystic lesion - presumably the lesion recently biopsied by history, as well as a 1.2 cm rounded area at the tip of the pancreatic tail which may represent normal pancreatic tissue versus possible mass.  If outside studies cannot be obtained for comparison, recommend short-term interval evaluation if clinically indicated by the outside subpecialists following this patient.   Original Report  Authenticated By: Harmon Pier, M.D.    Medications: I have reviewed the patient's current medications. Scheduled Meds: . amLODipine  10 mg Oral Daily  . enoxaparin (LOVENOX) injection  40 mg Subcutaneous Q24H  . pantoprazole  40 mg Oral Daily   Continuous Infusions: . dextrose 5 % 1,000 mL infusion     PRN Meds:.alum & mag hydroxide-simeth, morphine injection, ondansetron (ZOFRAN) IV, ondansetron Assessment/Plan:  1) Acute pancreatitis He is symptomatically improving now with hunger and improved abd pain. Hemodynamically stable, no leukocytosis or fever. Repeat CT scan w no significant changes and minimal pancreatic inflammation. After discussing with him, will continue NPO for today as still with some pain and concern for relapse. Discussed extensively importance of TOTAL alcohol abstinence, as well as food triggers for pancreatitis.  - Continue npo, IVF. Will add dextrose to fluids. D51/2NS @ 125 cc/hr - Pain mgmt morphine 4mg  q 4 hours - Zofran prn nausea  Dispo: Disposition is deferred at this time, awaiting improvement of current medical problems.  Anticipated discharge in approximately 1-2 day(s).   The patient does have a current PCP Shirlee Latch, Pasty Spillers, MD), therefore will be requiring OPC follow-up after discharge.   The patient does not have transportation limitations that hinder transportation to clinic appointments.  .Services Needed at time of discharge: Y = Yes, Blank = No PT:   OT:   RN:   Equipment:   Other:     LOS: 1 day   Bronson Curb 05/28/2012, 10:41 AM

## 2012-05-28 NOTE — Progress Notes (Signed)
Chaplain Note: Responded to Order Requisition for "prayer".  Provided active/reflective listening as pt talked about his illness and not being able to eat. Prayed with pt and provided spiritual support.  Rutherford Nail Chaplain Resident [phone]

## 2012-05-29 LAB — CBC
Hemoglobin: 15.2 g/dL (ref 13.0–17.0)
MCHC: 34.5 g/dL (ref 30.0–36.0)
RBC: 4.86 MIL/uL (ref 4.22–5.81)
WBC: 5.3 10*3/uL (ref 4.0–10.5)

## 2012-05-29 LAB — BASIC METABOLIC PANEL
BUN: 6 mg/dL (ref 6–23)
GFR calc Af Amer: 70 mL/min — ABNORMAL LOW (ref >90)
GFR calc non Af Amer: 61 mL/min — ABNORMAL LOW (ref >90)
Potassium: 3.6 mEq/L (ref 3.5–5.1)
Sodium: 135 mEq/L (ref 135–145)

## 2012-05-29 MED ORDER — WHITE PETROLATUM GEL
Status: AC
  Administered 2012-05-29: 10:00:00
  Filled 2012-05-29: qty 5

## 2012-05-29 MED ORDER — DEXTROSE-NACL 5-0.45 % IV SOLN
INTRAVENOUS | Status: DC
  Filled 2012-05-29 (×2): qty 1000

## 2012-05-29 MED ORDER — DEXTROSE-NACL 5-0.45 % IV SOLN
INTRAVENOUS | Status: DC
  Administered 2012-05-29 – 2012-05-31 (×5): via INTRAVENOUS

## 2012-05-29 NOTE — Progress Notes (Signed)
Subjective: Still continues to have sharp, shooting belly pain and worried about eating. No vomiting, but reports diarrhea (no BM today, but 2-3 loose stools yesterday).  Feels nauseous and had abdominal pain even with water to brush teeth.  Reports he is going to stop smoking and drinking wine.   Objective: Vital signs in last 24 hours: Filed Vitals:   05/28/12 1416 05/28/12 2112 05/29/12 0520 05/29/12 1014  BP: 133/81 123/75 100/57 133/86  Pulse: 67 68 57   Temp: 98.8 F (37.1 C) 98.4 F (36.9 C) 98.4 F (36.9 C)   TempSrc: Oral Oral Oral   Resp: 18 18 16    Height:      Weight:      SpO2: 100% 97% 100%    Weight change:   Intake/Output Summary (Last 24 hours) at 05/29/12 1017 Last data filed at 05/29/12 0521  Gross per 24 hour  Intake      0 ml  Output   1500 ml  Net  -1500 ml   Vitals reviewed. Afebrile overnight.  General: resting in bed HEENT: PERRL, EOMI, no scleral icterus Cardiac: RRR, no rubs, murmurs or gallops Pulm: clear to auscultation bilaterally, moving normal volumes of air Abd: soft, nontender but with voluntary guarding, nondistended, BS normoactive Ext: warm and well perfused, no pedal edema Neuro: alert and oriented X3, cranial nerves II-XII grossly intact  Lab Results: Basic Metabolic Panel:  Recent Labs Lab 05/28/12 0520 05/29/12 0622  NA 140 135  K 3.9 3.6  CL 104 98  CO2 23 24  GLUCOSE 87 133*  BUN 7 6  CREATININE 1.26 1.30  CALCIUM 9.1 9.5   Liver Function Tests:  Recent Labs Lab 05/27/12 0617 05/28/12 0520  AST 29 22  ALT 38 31  ALKPHOS 106 87  BILITOT 1.0 1.3*  PROT 7.8 6.8  ALBUMIN 4.3 3.6    Recent Labs Lab 05/27/12 0617  LIPASE 2172*   CBC:  Recent Labs Lab 05/27/12 0617  05/28/12 0520 05/29/12 0622  WBC 8.1  --  6.4 5.3  NEUTROABS 4.4  --   --   --   HGB 17.0  < > 15.6 15.2  HCT 46.7  < > 45.1 44.0  MCV 89.8  --  90.6 90.5  PLT 243  --  228 223  < > = values in this interval not  displayed.  Studies/Results: Ct Abdomen Pelvis W Contrast  05/27/2012  *RADIOLOGY REPORT*  Clinical Data: 52 year old male with abdominal and pelvic pain. History of pancreatic cysts within nondiagnostic biopsy performed at [facility] on 04/01/2012 - question serous cystadenoma vs intraductal papillary mucinous neoplasm.  CT ABDOMEN AND PELVIS WITH CONTRAST  Technique:  Multidetector CT imaging of the abdomen and pelvis was performed following the standard protocol during bolus administration of intravenous contrast.  Contrast: OMNIPAQUE IOHEXOL 300 MG/ML  SOLN  Comparison: None  Findings: The liver, spleen, kidneys, adrenal glands and gallbladder are unremarkable. A 1.3 x 1.9 cm cystic lesion with the pancreatic head (image 22) is identified. A 1.2 cm rounded area at the tip of the pancreatic tail (image 18) has enhancement characteristics similar to the remainder of the pancreas - recommend comparison to prior outside studies.  No free fluid, enlarged lymph nodes, biliary dilation or abdominal aortic aneurysm identified.  The bowel, appendix and bladder are unremarkable. There is no evidence of bowel obstruction, abscess or pneumoperitoneum. Mild degenerative changes the hips and lumbar spine noted. No acute or suspicious bony abnormalities are identified.  IMPRESSION: No evidence of acute abnormality.  1.3 x 1.9 cm pancreatic head cystic lesion - presumably the lesion recently biopsied by history, as well as a 1.2 cm rounded area at the tip of the pancreatic tail which may represent normal pancreatic tissue versus possible mass.  If outside studies cannot be obtained for comparison, recommend short-term interval evaluation if clinically indicated by the outside subpecialists following this patient.   Original Report Authenticated By: Harmon Pier, M.D.    Medications: I have reviewed the patient's current medications. Scheduled Meds: . amLODipine  10 mg Oral Daily  . enoxaparin (LOVENOX)  injection  40 mg Subcutaneous Q24H  . pantoprazole  40 mg Oral Daily   Continuous Infusions: . dextrose 5 % 1,000 mL infusion 125 mL/hr at 05/29/12 0400   PRN Meds:.alum & mag hydroxide-simeth, morphine injection, ondansetron (ZOFRAN) IV, ondansetron Assessment/Plan:  # Acute pancreatitis  Improved but with persistent pain and nausea; does not feel ready to eat yet; remains without leukocytosis and fever.  -Continue NPO for today as still with some pain and concern for relapse. Again, discussed importance of TOTAL alcohol abstinence, as well as food triggers for pancreatitis.  - Continue  IVF - D51/2NS @ 125 cc/hr  - Pain mgmt morphine 4mg  q 3 hours  - Zofran prn nausea   #VTE ppx: lovenox   Dispo: Disposition is deferred at this time, awaiting improvement of current medical problems.  Anticipated discharge in approximately 1-2 day(s).   The patient does have a current PCP Shirlee Latch, Pasty Spillers, MD), therefore will be requiring OPC follow-up after discharge.   The patient does not have transportation limitations that hinder transportation to clinic appointments.  .Services Needed at time of discharge: Y = Yes, Blank = No PT:   OT:   RN:   Equipment:   Other:     LOS: 2 days   Taneya Conkel 05/29/2012, 10:17 AM

## 2012-05-30 LAB — BASIC METABOLIC PANEL
Chloride: 99 mEq/L (ref 96–112)
GFR calc non Af Amer: 55 mL/min — ABNORMAL LOW (ref >90)
Glucose, Bld: 119 mg/dL — ABNORMAL HIGH (ref 70–99)
Potassium: 3.9 mEq/L (ref 3.5–5.1)
Sodium: 137 mEq/L (ref 135–145)

## 2012-05-30 LAB — CBC
Hemoglobin: 16.6 g/dL (ref 13.0–17.0)
MCH: 31.3 pg (ref 26.0–34.0)
RBC: 5.3 MIL/uL (ref 4.22–5.81)

## 2012-05-30 MED ORDER — HYDROMORPHONE HCL PF 1 MG/ML IJ SOLN
0.5000 mg | INTRAMUSCULAR | Status: DC | PRN
  Administered 2012-05-30: 0.5 mg via INTRAVENOUS
  Filled 2012-05-30: qty 1

## 2012-05-30 MED ORDER — HYDROMORPHONE HCL PF 1 MG/ML IJ SOLN
0.5000 mg | INTRAMUSCULAR | Status: DC | PRN
  Administered 2012-05-30 – 2012-05-31 (×4): 0.5 mg via INTRAVENOUS
  Filled 2012-05-30 (×4): qty 1

## 2012-05-30 NOTE — Progress Notes (Signed)
Internal Medicine Teaching Service Attending Note Date: 05/30/2012  Patient name: Jerry Terrell  Medical record number: [id number]  Date of birth: 1960/07/11    This patient has been seen and discussed with the house staff. Please see their note for complete details. I concur with their findings.   Pancreatitis -  Advance diet as tolerated, continue IVF.  Would start to titrate down pain medicines as he begins to tolerate PO.    MULLEN, EMILY 05/30/2012, 4:39 PM

## 2012-05-30 NOTE — Progress Notes (Signed)
Subjective: Says he feels significantly better. Still occasional sharp shooting pains when analgesics wear off. Hungry and ready to advance diet today. Denies CP, SOB, N/V, dizziness.   Objective: Vital signs in last 24 hours: Filed Vitals:   05/29/12 1014 05/29/12 1459 05/29/12 2113 05/30/12 0541  BP: 133/86 113/65 119/75 106/61  Pulse:  60 64 58  Temp:  99 F (37.2 C) 98.9 F (37.2 C) 97.9 F (36.6 C)  TempSrc:  Oral Oral Oral  Resp:  14 16 17   Height:      Weight:      SpO2:  100% 100% 100%   Weight change:   Intake/Output Summary (Last 24 hours) at 05/30/12 0930 Last data filed at 05/30/12 0359  Gross per 24 hour  Intake   1000 ml  Output    850 ml  Net    150 ml   Vitals reviewed. Afebrile overnight.  General: resting in bed HEENT: PERRL, EOMI, no scleral icterus Cardiac: RRR, no rubs, murmurs or gallops Pulm: CTAB Abd: soft, no rebound/guarding with distraction. Normoactive BS. Ext: warm and well perfused, no pedal edema Neuro: alert and oriented X3, cranial nerves II-XII grossly intact  Lab Results: Basic Metabolic Panel:  Recent Labs Lab 05/29/12 0622 05/30/12 0635  NA 135 137  K 3.6 3.9  CL 98 99  CO2 24 28  GLUCOSE 133* 119*  BUN 6 6  CREATININE 1.30 1.41*  CALCIUM 9.5 9.8   Liver Function Tests:  Recent Labs Lab 05/27/12 0617 05/28/12 0520  AST 29 22  ALT 38 31  ALKPHOS 106 87  BILITOT 1.0 1.3*  PROT 7.8 6.8  ALBUMIN 4.3 3.6    Recent Labs Lab 05/27/12 0617  LIPASE 2172*   CBC:  Recent Labs Lab 05/27/12 0617  05/29/12 0622 05/30/12 0635  WBC 8.1  < > 5.3 6.1  NEUTROABS 4.4  --   --   --   HGB 17.0  < > 15.2 16.6  HCT 46.7  < > 44.0 48.0  MCV 89.8  < > 90.5 90.6  PLT 243  < > 223 245  < > = values in this interval not displayed.  Studies/Results: No results found. Medications: I have reviewed the patient's current medications. Scheduled Meds: . amLODipine  10 mg Oral Daily  . enoxaparin (LOVENOX) injection  40 mg  Subcutaneous Q24H  . pantoprazole  40 mg Oral Daily   Continuous Infusions: . dextrose 5 % and 0.45% NaCl 125 mL/hr at 05/30/12 0544   PRN Meds:.alum & mag hydroxide-simeth, morphine injection, ondansetron (ZOFRAN) IV, ondansetron Assessment/Plan:  # Acute pancreatitis  Pain improved today. No nausea.  -Advance to clears today  - Continue  IVF - D51/2NS @ 100 cc/hr  - Pain mgmt morphine 4mg  q 3 hours  - Zofran prn nausea   #VTE ppx: lovenox   Dispo: Disposition is deferred at this time, awaiting improvement of current medical problems.  Anticipated discharge in approximately 1-2 day(s).   The patient does have a current PCP Shirlee Latch, Pasty Spillers, MD), therefore will be requiring OPC follow-up after discharge.   The patient does not have transportation limitations that hinder transportation to clinic appointments.  .Services Needed at time of discharge: Y = Yes, Blank = No PT:   OT:   RN:   Equipment:   Other:     LOS: 3 days   Bronson Curb 05/30/2012, 9:30 AM

## 2012-05-31 LAB — CBC
Hemoglobin: 14.7 g/dL (ref 13.0–17.0)
MCH: 31.5 pg (ref 26.0–34.0)
MCV: 90.6 fL (ref 78.0–100.0)
RBC: 4.67 MIL/uL (ref 4.22–5.81)

## 2012-05-31 LAB — BASIC METABOLIC PANEL
CO2: 28 mEq/L (ref 19–32)
Calcium: 9.4 mg/dL (ref 8.4–10.5)
Glucose, Bld: 110 mg/dL — ABNORMAL HIGH (ref 70–99)
Potassium: 4.4 mEq/L (ref 3.5–5.1)
Sodium: 138 mEq/L (ref 135–145)

## 2012-05-31 MED ORDER — HYDROCODONE-ACETAMINOPHEN 5-325 MG PO TABS
2.0000 | ORAL_TABLET | ORAL | Status: DC | PRN
  Administered 2012-05-31 – 2012-06-01 (×7): 2 via ORAL
  Filled 2012-05-31 (×7): qty 2

## 2012-05-31 MED ORDER — SODIUM CHLORIDE 0.9 % IV SOLN
INTRAVENOUS | Status: DC
  Administered 2012-05-31 – 2012-06-01 (×3): via INTRAVENOUS

## 2012-05-31 NOTE — Progress Notes (Signed)
Internal Medicine Teaching Service Attending Note Date: 05/31/2012  Patient name: Jerry Terrell  Medical record number: [id number]  Date of birth: 1960/09/20    This patient has been seen and discussed with the house staff. Please see their note for complete details. I concur with their findings with the following additions/corrections:  Improved PO intake, transition to Oral pain medications.  Slight bump in Cr today, possibly due to dehydration, continued IVF.  If continues to rise tomorrow, will further investigate.  Anticipate improvement with improved PO intake today.   Azusena Erlandson 05/31/2012, 12:26 PM

## 2012-05-31 NOTE — Progress Notes (Signed)
Subjective: Overall feels better. Did have nausea last night and pain with full liquids, because he said that food was not appetizing to him. He would like to try clear liquids and then maybe chicken noodle soup or cream of wheat.  Objective: Vital signs in last 24 hours: Filed Vitals:   05/30/12 0541 05/30/12 1344 05/30/12 2211 05/31/12 0726  BP: 106/61 119/73 114/60 111/56  Pulse: 58 64 67 58  Temp: 97.9 F (36.6 C) 98.7 F (37.1 C) 98.4 F (36.9 C) 98.1 F (36.7 C)  TempSrc: Oral Oral Oral Oral  Resp: 17 18 18 18   Height:      Weight:      SpO2: 100% 100% 100% 100%   Weight change:   Intake/Output Summary (Last 24 hours) at 05/31/12 0846 Last data filed at 05/30/12 1300  Gross per 24 hour  Intake 1013.33 ml  Output      0 ml  Net 1013.33 ml   Vitals reviewed.   General: resting in bed HEENT: PERRL, EOMI, no scleral icterus Cardiac: RRR, no rubs, murmurs or gallops Pulm: CTAB Abd: soft, no rebound/guarding with distraction. Normoactive BS. Ext: warm and well perfused, no pedal edema Neuro: alert and oriented X3, cranial nerves II-XII grossly intact  Lab Results: Basic Metabolic Panel:  Recent Labs Lab 05/30/12 0635 05/31/12 0509  NA 137 138  K 3.9 4.4  CL 99 103  CO2 28 28  GLUCOSE 119* 110*  BUN 6 6  CREATININE 1.41* 1.48*  CALCIUM 9.8 9.4   Liver Function Tests:  Recent Labs Lab 05/27/12 0617 05/28/12 0520  AST 29 22  ALT 38 31  ALKPHOS 106 87  BILITOT 1.0 1.3*  PROT 7.8 6.8  ALBUMIN 4.3 3.6    Recent Labs Lab 05/27/12 0617  LIPASE 2172*   CBC:  Recent Labs Lab 05/27/12 0617  05/30/12 0635 05/31/12 0509  WBC 8.1  < > 6.1 5.6  NEUTROABS 4.4  --   --   --   HGB 17.0  < > 16.6 14.7  HCT 46.7  < > 48.0 42.3  MCV 89.8  < > 90.6 90.6  PLT 243  < > 245 227  < > = values in this interval not displayed.  Studies/Results: No results found. Medications: I have reviewed the patient's current medications. Scheduled Meds: . amLODipine   10 mg Oral Daily  . enoxaparin (LOVENOX) injection  40 mg Subcutaneous Q24H  . pantoprazole  40 mg Oral Daily   Continuous Infusions: . dextrose 5 % and 0.45% NaCl 100 mL/hr at 05/31/12 0025   PRN Meds:.alum & mag hydroxide-simeth, HYDROmorphone (DILAUDID) injection, ondansetron (ZOFRAN) IV, ondansetron Assessment/Plan:  # Acute pancreatitis  Pain improved today overall   -Continue full today, advance as tolerated later in the day - Continue  IVF - NS @ 100 cc/hr  - Will transition to po pain meds - Norco 2 tabs q 4 hours  - Zofran prn nausea   #VTE ppx: lovenox   Dispo: Disposition is deferred at this time, awaiting improvement of current medical problems.  Anticipated discharge in approximately 1 day(s).   The patient does have a current PCP Shirlee Latch, Pasty Spillers, MD), therefore will be requiring OPC follow-up after discharge.   The patient does not have transportation limitations that hinder transportation to clinic appointments.  .Services Needed at time of discharge: Y = Yes, Blank = No PT:   OT:   RN:   Equipment:   Other:     LOS:  4 days   Bronson Curb 05/31/2012, 8:46 AM

## 2012-06-01 ENCOUNTER — Encounter: Payer: Self-pay | Admitting: Internal Medicine

## 2012-06-01 LAB — CBC
MCH: 31.2 pg (ref 26.0–34.0)
MCV: 90.3 fL (ref 78.0–100.0)
Platelets: 224 10*3/uL (ref 150–400)
RDW: 12.9 % (ref 11.5–15.5)

## 2012-06-01 LAB — BASIC METABOLIC PANEL
BUN: 6 mg/dL (ref 6–23)
CO2: 29 mEq/L (ref 19–32)
Calcium: 9.1 mg/dL (ref 8.4–10.5)
Creatinine, Ser: 1.34 mg/dL (ref 0.50–1.35)
Glucose, Bld: 112 mg/dL — ABNORMAL HIGH (ref 70–99)

## 2012-06-01 MED ORDER — HYDROCODONE-ACETAMINOPHEN 5-325 MG PO TABS: 1.0000 | ORAL_TABLET | Freq: Four times a day (QID) | ORAL | Status: DC | PRN

## 2012-06-01 MED ORDER — WHITE PETROLATUM GEL
Status: AC
  Filled 2012-06-01: qty 5

## 2012-06-01 NOTE — Discharge Summary (Signed)
Internal Medicine Teaching [facility] Discharge Note  Name: Jerry Terrell MRN: [id number] DOB: [birth year] 52 y.o.  Date of Admission: 05/27/2012  5:45 AM Date of Discharge: 06/01/2012 Attending Physician: Jerry Catalina, MD  Discharge Diagnosis: Principal Problem:   Acute pancreatitis Active Problems:   HTN (hypertension)   Discharge Medications:   Medication List    TAKE these medications       amLODipine 10 MG tablet  Commonly known as:  NORVASC  Take 10 mg by mouth daily.     hydrochlorothiazide 25 MG tablet  Commonly known as:  HYDRODIURIL  Take 25 mg by mouth daily.     HYDROcodone-acetaminophen 5-325 MG per tablet  Commonly known as:  NORCO/VICODIN  Take 1-2 tablets by mouth every 6 (six) hours as needed.     ranitidine 150 MG tablet  Commonly known as:  ZANTAC  Take 150 mg by mouth 2 (two) times daily.        Disposition and follow-up:   [name] was discharged from [facility] in Good condition.  At the hospital follow up visit please address   1) Acute pancreatitis/chronic pseudocyst Please follow up resolution of symptoms. 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 On CT of abdomen here, noted to have 1.3 x 1.9 cm pancreatic head cystic lesion - presumably the lesion recently biopsied by history, as well as a 1.2 cm rounded area at the tip of the pancreatic tail which may represent normal pancreatic tissue versus possible mass. Will need outpatient follow-up appt to be arranged w [facility] for f/u of pancreatic tail lesion.   Follow-up Appointments:     Follow-up Information   Follow up with Jerry Gula, MD On 06/23/2012. (3:15pm)    Contact information:   [address] [place] [address] [phone]      Discharge Orders   Future Orders Complete By Expires     Activity as tolerated - No restrictions  As directed        Consultations:  none  Procedures Performed:  Ct Abdomen Pelvis W  Contrast  05/27/2012   *RADIOLOGY REPORT*  Clinical Data: 52 year old male with abdominal and pelvic pain. History of pancreatic cysts within nondiagnostic biopsy performed at 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  CT ABDOMEN AND PELVIS WITH CONTRAST  Technique:  Multidetector CT imaging of the abdomen and pelvis was performed following the standard protocol during bolus administration of intravenous contrast.  Contrast: OMNIPAQUE IOHEXOL 300 MG/ML  SOLN  Comparison: None  Findings: The liver, spleen, kidneys, adrenal glands and gallbladder are unremarkable. A 1.3 x 1.9 cm cystic lesion with the pancreatic head (image 22) is identified. A 1.2 cm rounded area at the tip of the pancreatic tail (image 18) has enhancement characteristics similar to the remainder of the pancreas - recommend comparison to prior outside studies.  No free fluid, enlarged lymph nodes, biliary dilation or abdominal aortic aneurysm identified.  The bowel, appendix and bladder are unremarkable. There is no evidence of bowel obstruction, abscess or pneumoperitoneum. Mild degenerative changes the hips and lumbar spine noted. No acute or suspicious bony abnormalities are identified.  IMPRESSION: No evidence of acute abnormality.  1.3 x 1.9 cm pancreatic head cystic lesion - presumably the lesion recently biopsied by history, as well as a 1.2 cm rounded area at the tip of the pancreatic tail which may represent normal pancreatic tissue versus possible mass.  If  outside studies cannot be obtained for comparison, recommend short-term interval evaluation if clinically indicated by the outside subpecialists following this patient.   Original Report Authenticated By: Harmon Pier, M.D.    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  Patient reports that he has been having some intermittent abdominal pain for last 1 month that has been aggressively getting worse for last few weeks. He states that he started feeling nauseous about a week ago that he thought could be related to "stomach bug" and did not pay much attention. He woke up this morning to drop his wife to the work and started having excruciating abdominal pain and an episode of vomiting that prompted him to come to the ER. He rates his pain 10 out of 10, located in the epigastrium and left upper quadrant with radiation to the back. He describes his pain as sharp and shooting discomfort. His vomitus was nonbilious and nonbloody. Denies any diarrhea, fever, chills, cough or shortness of breath . He states that he drinks a glass of wine every day and smoke marijuana and cigarettes.  Patient has this history of pancreatic cyst that has been present for ~ 6 years and he underwent EUS guided biopsy, FNA, and injection of Cipro into pancreatic pseudocyst at [facility] on 04/14/12. Reviewed notes. 5cc of clear colorless fluid was evaluated with nondiagnostic results due to scant cellularity. CEA 554, amylase >12K. EUS with 11.6 X 15 mm cyst with septations in the head ofthe pancreas   Hospital Course by problem list:  Acute Pancreatitis; Patient has a history of pancreatic cyst present ~ 6 years. Work up included EUS ultrasound in 12/13 at [facility] by Dr. Christella Hartigan , where cyst was determined to be benign in origin. Because of persistent abdominal pain , he underwent EUS guided biopsy at [facility] in 0 3/14. 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  Patient  presents to the hospital this admission with worsening abdominal pain and vomiting. His lipase was noted to be 2100. Etiology of his acute pancreatitis flare is unclear but he does report drinking a glass of wine a day. Patient had normal liver function, no evidence of pseudocyst infection or pancreatic necrosis. Repeat CT of abdomen show oted to have 1.3 x 1.9 cm pancreatic head cystic lesion - presumably the lesion recently biopsied by history, as well as a 1.2 cm rounded area at the tip of the pancreatic tail which may represent normal pancreatic tissue versus possible mass.  Was treated for pancreatitis with bowel rest, IVF, analgesia. His diet slowly advanced as tolerated. On day of discharge, he was tolerating diet with minimal pain and ambulating freely up and down the hall. No nausea or vomiting.  Will need outpatient follow-up appt to be arranged w [facility] for f/u of pancreatic tail lesion.   # HTN BP stable during admission. Continued home amlodipine.   Discharge Vitals:  BP 116/70  Pulse 62  Temp(Src) 98 F (36.7 C) (Oral)  Resp 18  Ht 5' 8.5" (1.74 m)  Wt 180 lb (81.647 kg)  BMI 26.97  kg/m2  SpO2 100%  Discharge Labs:  Results for orders placed during the hospital encounter of 05/27/12 (from the past 24 hour(s))  BASIC METABOLIC PANEL     Status: Abnormal   Collection Time    06/01/12  5:15 AM      Result Value Range   Sodium 140  135 - 145 mEq/L   Potassium 4.1  3.5 - 5.1 mEq/L   Chloride 106  96 - 112 mEq/L   CO2 29  19 - 32 mEq/L   Glucose, Bld 112 (*) 70 - 99 mg/dL   BUN 6  6 - 23 mg/dL   Creatinine, Ser 1.61  0.50 - 1.35 mg/dL   Calcium 9.1  8.4 - 09.6 mg/dL   GFR calc non Af Amer 59 (*) >90 mL/min   GFR calc Af Amer 68 (*) >90 mL/min  CBC     Status: None   Collection Time    06/01/12  5:15 AM      Result Value Range   WBC 5.2  4.0 - 10.5 K/uL   RBC 4.74  4.22 - 5.81 MIL/uL   Hemoglobin 14.8  13.0 - 17.0 g/dL   HCT 04.5  40.9 - 81.1 %   MCV 90.3  78.0 - 100.0  fL   MCH 31.2  26.0 - 34.0 pg   MCHC 34.6  30.0 - 36.0 g/dL   RDW 91.4  78.2 - 95.6 %   Platelets 224  150 - 400 K/uL    Signed: Bronson Curb 06/01/2012, 11:13 AM   Time Spent on Discharge: 35 min  Services Ordered on Discharge: none Equipment Ordered on Discharge: none

## 2012-06-01 NOTE — Progress Notes (Signed)
Subjective: Tolerating diet, pain well controlled on po pain meds. No N/V. Ready to go home  Objective: Vital signs in last 24 hours: Filed Vitals:   05/31/12 0908 05/31/12 1344 05/31/12 2224 06/01/12 0550  BP: 120/60 131/82 135/84 116/70  Pulse:  66 57 62  Temp:  98.1 F (36.7 C) 98.1 F (36.7 C) 98 F (36.7 C)  TempSrc:  Oral Oral Oral  Resp:  17 18 18   Height:      Weight:      SpO2:  100% 100% 100%   Weight change:   Intake/Output Summary (Last 24 hours) at 06/01/12 0954 Last data filed at 06/01/12 0600  Gross per 24 hour  Intake 2203.33 ml  Output   1450 ml  Net 753.33 ml   Vitals reviewed.   General: Sitting in chair, reading newspaper HEENT: MMM of OP Cardiac: RRR, no rubs, murmurs or gallops Pulm: CTAB Abd: soft, no rebound/guarding with distraction. Normoactive BS. Ext: warm and well perfused, no pedal edema Neuro: alert and oriented X3, cranial nerves II-XII grossly intact  Lab Results: Basic Metabolic Panel:  Recent Labs Lab 05/31/12 0509 06/01/12 0515  NA 138 140  K 4.4 4.1  CL 103 106  CO2 28 29  GLUCOSE 110* 112*  BUN 6 6  CREATININE 1.48* 1.34  CALCIUM 9.4 9.1   Liver Function Tests:  Recent Labs Lab 05/27/12 0617 05/28/12 0520  AST 29 22  ALT 38 31  ALKPHOS 106 87  BILITOT 1.0 1.3*  PROT 7.8 6.8  ALBUMIN 4.3 3.6    Recent Labs Lab 05/27/12 0617  LIPASE 2172*   CBC:  Recent Labs Lab 05/27/12 0617  05/31/12 0509 06/01/12 0515  WBC 8.1  < > 5.6 5.2  NEUTROABS 4.4  --   --   --   HGB 17.0  < > 14.7 14.8  HCT 46.7  < > 42.3 42.8  MCV 89.8  < > 90.6 90.3  PLT 243  < > 227 224  < > = values in this interval not displayed.  Studies/Results: No results found. Medications: I have reviewed the patient's current medications. Scheduled Meds: . amLODipine  10 mg Oral Daily  . enoxaparin (LOVENOX) injection  40 mg Subcutaneous Q24H  . pantoprazole  40 mg Oral Daily  . white petrolatum       Continuous Infusions:   PRN  Meds:.alum & mag hydroxide-simeth, HYDROcodone-acetaminophen, ondansetron (ZOFRAN) IV, ondansetron Assessment/Plan:  # Acute pancreatitis  Resolving. Tolerating diet, po pain meds. Discussed EtOH abstinence. Pt to go home today.    #VTE ppx: lovenox   Dispo: Disposition is deferred at this time, awaiting improvement of current medical problems.  Anticipated discharge in approximately 1 day(s).   The patient does have a current PCP Shirlee Latch, Pasty Spillers, MD), therefore will be requiring OPC follow-up after discharge.   The patient does not have transportation limitations that hinder transportation to clinic appointments.  .Services Needed at time of discharge: Y = Yes, Blank = No PT:   OT:   RN:   Equipment:   Other:     LOS: 5 days   Bronson Curb 06/01/2012, 9:54 AM

## 2012-06-02 ENCOUNTER — Other Ambulatory Visit: Payer: Self-pay | Admitting: Internal Medicine

## 2012-06-02 MED ORDER — PROMETHAZINE HCL 25 MG PO TABS: 25.0000 mg | ORAL_TABLET | Freq: Four times a day (QID) | ORAL | Status: DC | PRN

## 2012-06-03 ENCOUNTER — Telehealth: Payer: Self-pay | Admitting: *Deleted

## 2012-06-03 NOTE — Telephone Encounter (Signed)
Heather calls again this am to say pt has now called her and states he will run out of pain med before his visit, i see the FYI from dr Shirlee Latch and inform heather that the #25 should last pt until his appt for f/u and if he has concerns to please call the clinic

## 2012-06-09 NOTE — Discharge Summary (Signed)
I saw Mr. Jerry Terrell on day of discharge and assisted in the discharge plan.

## 2012-06-12 ENCOUNTER — Encounter (HOSPITAL_COMMUNITY): Payer: Self-pay

## 2012-06-12 ENCOUNTER — Emergency Department (INDEPENDENT_AMBULATORY_CARE_PROVIDER_SITE_OTHER)
Admission: EM | Admit: 2012-06-12 | Discharge: 2012-06-12 | Disposition: A | Discharge status: Home / Self Care | Payer: No Typology Code available for payment source | Source: Home / Self Care | Attending: Family Medicine | Admitting: Family Medicine

## 2012-06-12 DIAGNOSIS — T148 Other injury of unspecified body region: Secondary | ICD-10-CM

## 2012-06-12 DIAGNOSIS — R51 Headache: Secondary | ICD-10-CM

## 2012-06-12 DIAGNOSIS — W57XXXA Bitten or stung by nonvenomous insect and other nonvenomous arthropods, initial encounter: Secondary | ICD-10-CM

## 2012-06-12 HISTORY — DX: Acute pancreatitis without necrosis or infection, unspecified: K85.90

## 2012-06-12 MED ORDER — HYDROCODONE-ACETAMINOPHEN 5-325 MG PO TABS: 1.0000 | ORAL_TABLET | Freq: Three times a day (TID) | ORAL | Status: DC | PRN

## 2012-06-12 MED ORDER — DOXYCYCLINE HYCLATE 100 MG PO CAPS: 100.0000 mg | ORAL_CAPSULE | Freq: Two times a day (BID) | ORAL | Status: DC

## 2012-06-12 NOTE — ED Notes (Signed)
Patient has many complaints today, states that he removed an insect from his outer left thigh on Saturday states that it was possible a tick, but since has started having joint pain in his arms, pain and swelling at site as well as advising having pain related to his pancreas. He was recently hospitalized for pancreatitis

## 2012-06-13 ENCOUNTER — Emergency Department (HOSPITAL_COMMUNITY)
Admission: EM | Admit: 2012-06-13 | Discharge: 2012-06-13 | Disposition: A | Discharge status: Home / Self Care | Payer: No Typology Code available for payment source | Attending: Emergency Medicine | Admitting: Emergency Medicine

## 2012-06-13 ENCOUNTER — Encounter (HOSPITAL_COMMUNITY): Payer: Self-pay | Admitting: Emergency Medicine

## 2012-06-13 DIAGNOSIS — Z87448 Personal history of other diseases of urinary system: Secondary | ICD-10-CM | POA: Insufficient documentation

## 2012-06-13 DIAGNOSIS — J45909 Unspecified asthma, uncomplicated: Secondary | ICD-10-CM | POA: Insufficient documentation

## 2012-06-13 DIAGNOSIS — Z8719 Personal history of other diseases of the digestive system: Secondary | ICD-10-CM | POA: Insufficient documentation

## 2012-06-13 DIAGNOSIS — W57XXXA Bitten or stung by nonvenomous insect and other nonvenomous arthropods, initial encounter: Secondary | ICD-10-CM | POA: Insufficient documentation

## 2012-06-13 DIAGNOSIS — Z8619 Personal history of other infectious and parasitic diseases: Secondary | ICD-10-CM | POA: Insufficient documentation

## 2012-06-13 DIAGNOSIS — N189 Chronic kidney disease, unspecified: Secondary | ICD-10-CM | POA: Insufficient documentation

## 2012-06-13 DIAGNOSIS — G8929 Other chronic pain: Secondary | ICD-10-CM | POA: Insufficient documentation

## 2012-06-13 DIAGNOSIS — Z87828 Personal history of other (healed) physical injury and trauma: Secondary | ICD-10-CM | POA: Insufficient documentation

## 2012-06-13 DIAGNOSIS — S90569A Insect bite (nonvenomous), unspecified ankle, initial encounter: Secondary | ICD-10-CM | POA: Insufficient documentation

## 2012-06-13 DIAGNOSIS — K219 Gastro-esophageal reflux disease without esophagitis: Secondary | ICD-10-CM | POA: Insufficient documentation

## 2012-06-13 DIAGNOSIS — G43909 Migraine, unspecified, not intractable, without status migrainosus: Secondary | ICD-10-CM | POA: Insufficient documentation

## 2012-06-13 DIAGNOSIS — R51 Headache: Secondary | ICD-10-CM | POA: Insufficient documentation

## 2012-06-13 DIAGNOSIS — Z79899 Other long term (current) drug therapy: Secondary | ICD-10-CM | POA: Insufficient documentation

## 2012-06-13 DIAGNOSIS — Y939 Activity, unspecified: Secondary | ICD-10-CM | POA: Insufficient documentation

## 2012-06-13 DIAGNOSIS — Z862 Personal history of diseases of the blood and blood-forming organs and certain disorders involving the immune mechanism: Secondary | ICD-10-CM | POA: Insufficient documentation

## 2012-06-13 DIAGNOSIS — R109 Unspecified abdominal pain: Secondary | ICD-10-CM

## 2012-06-13 DIAGNOSIS — I129 Hypertensive chronic kidney disease with stage 1 through stage 4 chronic kidney disease, or unspecified chronic kidney disease: Secondary | ICD-10-CM | POA: Insufficient documentation

## 2012-06-13 DIAGNOSIS — S30860A Insect bite (nonvenomous) of lower back and pelvis, initial encounter: Secondary | ICD-10-CM | POA: Insufficient documentation

## 2012-06-13 DIAGNOSIS — R52 Pain, unspecified: Secondary | ICD-10-CM | POA: Insufficient documentation

## 2012-06-13 DIAGNOSIS — M549 Dorsalgia, unspecified: Secondary | ICD-10-CM | POA: Insufficient documentation

## 2012-06-13 DIAGNOSIS — Y929 Unspecified place or not applicable: Secondary | ICD-10-CM | POA: Insufficient documentation

## 2012-06-13 DIAGNOSIS — Z9861 Coronary angioplasty status: Secondary | ICD-10-CM | POA: Insufficient documentation

## 2012-06-13 DIAGNOSIS — Z8639 Personal history of other endocrine, nutritional and metabolic disease: Secondary | ICD-10-CM | POA: Insufficient documentation

## 2012-06-13 DIAGNOSIS — F17200 Nicotine dependence, unspecified, uncomplicated: Secondary | ICD-10-CM | POA: Insufficient documentation

## 2012-06-13 DIAGNOSIS — Z8679 Personal history of other diseases of the circulatory system: Secondary | ICD-10-CM | POA: Insufficient documentation

## 2012-06-13 LAB — CBC WITH DIFFERENTIAL/PLATELET
Basophils Absolute: 0 10*3/uL (ref 0.0–0.1)
Basophils Relative: 1 % (ref 0–1)
Eosinophils Absolute: 0.2 10*3/uL (ref 0.0–0.7)
MCH: 32 pg (ref 26.0–34.0)
MCHC: 35.8 g/dL (ref 30.0–36.0)
Neutro Abs: 2.2 10*3/uL (ref 1.7–7.7)
Neutrophils Relative %: 37 % — ABNORMAL LOW (ref 43–77)
Platelets: 285 10*3/uL (ref 150–400)
RDW: 12.9 % (ref 11.5–15.5)

## 2012-06-13 LAB — COMPREHENSIVE METABOLIC PANEL
AST: 35 U/L (ref 0–37)
Albumin: 3.6 g/dL (ref 3.5–5.2)
Alkaline Phosphatase: 94 U/L (ref 39–117)
Chloride: 101 mEq/L (ref 96–112)
Creatinine, Ser: 1.12 mg/dL (ref 0.50–1.35)
Potassium: 3.7 mEq/L (ref 3.5–5.1)
Total Bilirubin: 0.7 mg/dL (ref 0.3–1.2)
Total Protein: 6.7 g/dL (ref 6.0–8.3)

## 2012-06-13 LAB — LIPASE, BLOOD: Lipase: 45 U/L (ref 11–59)

## 2012-06-13 MED ORDER — MORPHINE SULFATE 4 MG/ML IJ SOLN
4.0000 mg | Freq: Once | INTRAMUSCULAR | Status: AC
  Administered 2012-06-13: 4 mg via INTRAVENOUS

## 2012-06-13 MED ORDER — SODIUM CHLORIDE 0.9 % IV SOLN: 1000.0000 mL | INTRAVENOUS | Status: DC

## 2012-06-13 MED ORDER — SODIUM CHLORIDE 0.9 % IV SOLN
1000.0000 mL | Freq: Once | INTRAVENOUS | Status: AC
  Administered 2012-06-13: 1000 mL via INTRAVENOUS

## 2012-06-13 MED ORDER — DOXYCYCLINE HYCLATE 100 MG PO CAPS: 100.0000 mg | ORAL_CAPSULE | Freq: Two times a day (BID) | ORAL | Status: DC

## 2012-06-13 MED ORDER — MORPHINE SULFATE 4 MG/ML IJ SOLN: 4.0000 mg | Freq: Once | INTRAMUSCULAR | Status: DC

## 2012-06-13 MED ORDER — ONDANSETRON 4 MG PO TBDP
4.0000 mg | ORAL_TABLET | Freq: Once | ORAL | Status: AC
  Administered 2012-06-13: 4 mg via ORAL
  Filled 2012-06-13: qty 1

## 2012-06-13 MED ORDER — HYDROCODONE-ACETAMINOPHEN 10-300 MG PO TABS: 1.0000 | ORAL_TABLET | Freq: Four times a day (QID) | ORAL | Status: DC | PRN

## 2012-06-13 MED ORDER — MORPHINE SULFATE 4 MG/ML IJ SOLN
8.0000 mg | Freq: Once | INTRAMUSCULAR | Status: AC
  Administered 2012-06-13: 4 mg via INTRAVENOUS
  Filled 2012-06-13: qty 2

## 2012-06-13 MED ORDER — DOXYCYCLINE HYCLATE 100 MG PO TABS
200.0000 mg | ORAL_TABLET | Freq: Once | ORAL | Status: AC
  Administered 2012-06-13: 200 mg via ORAL
  Filled 2012-06-13: qty 2

## 2012-06-13 NOTE — ED Provider Notes (Signed)
Medical screening examination/treatment/procedure(s) were performed by non-physician practitioner and as supervising physician I was immediately available for consultation/collaboration.   Carleene Cooper III, MD 06/13/12 [phone]

## 2012-06-13 NOTE — ED Notes (Signed)
Pt went to [facility] yesterday and was dx with acute pancreatitis and [place] spouted fever.  Pt was told to come here but was unable to.  Pt reports he pulled the tick off of his self on sat.  Pt reports swelling and knot to upper anterior left thigh.
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
## 2012-06-13 NOTE — ED Notes (Signed)
Pt c/o abdominal pain onset yesterday. Pt was just recently admitted to hospital with pancreatis. Pt also reports took a tick off left thigh area on Saturday. Now with headache, body aches and fever (102) Saturday. Pt took tylenol that decreased the fever. Pt was seen yesterday at urgent care but could not afford prescriptions.
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
## 2012-06-23 ENCOUNTER — Ambulatory Visit (INDEPENDENT_AMBULATORY_CARE_PROVIDER_SITE_OTHER): Payer: No Typology Code available for payment source | Admitting: Internal Medicine

## 2012-06-23 ENCOUNTER — Encounter: Payer: Self-pay | Admitting: Internal Medicine

## 2012-06-23 VITALS — BP 148/90 | HR 75 | Temp 98.0°F | Ht 68.0 in | Wt 173.8 lb

## 2012-06-23 DIAGNOSIS — R768 Other specified abnormal immunological findings in serum: Secondary | ICD-10-CM

## 2012-06-23 DIAGNOSIS — R894 Abnormal immunological findings in specimens from other organs, systems and tissues: Secondary | ICD-10-CM

## 2012-06-23 DIAGNOSIS — K862 Cyst of pancreas: Secondary | ICD-10-CM

## 2012-06-23 DIAGNOSIS — W57XXXA Bitten or stung by nonvenomous insect and other nonvenomous arthropods, initial encounter: Secondary | ICD-10-CM

## 2012-06-23 DIAGNOSIS — I10 Essential (primary) hypertension: Secondary | ICD-10-CM

## 2012-06-23 DIAGNOSIS — R109 Unspecified abdominal pain: Secondary | ICD-10-CM

## 2012-06-23 DIAGNOSIS — G8929 Other chronic pain: Secondary | ICD-10-CM | POA: Insufficient documentation

## 2012-06-23 DIAGNOSIS — N411 Chronic prostatitis: Secondary | ICD-10-CM

## 2012-06-23 MED ORDER — HYDROCODONE-ACETAMINOPHEN 10-300 MG PO TABS: 1.0000 | ORAL_TABLET | Freq: Four times a day (QID) | ORAL | Status: DC | PRN

## 2012-06-23 NOTE — Patient Instructions (Addendum)
I will give you a 2 month pain medication supply under pain contract. Then you should get pain medications from the pain clinic.   I will refer you to pain management clinic for chronic pain management if you miss the appointment will will not prescribe you more pain medications  Please follow up with [facility] soon and urology in 08/2012  Follow up in 4-6 months  Abdominal Pain Abdominal pain can be caused by many things. Your caregiver decides the seriousness of your pain by an examination and possibly blood tests and X-rays. Many cases can be observed and treated at home. Most abdominal pain is not caused by a disease and will probably improve without treatment. However, in many cases, more time must pass before a clear cause of the pain can be found. Before that point, it may not be known if you need more testing, or if hospitalization or surgery is needed. HOME CARE INSTRUCTIONS   Do not take laxatives unless directed by your caregiver.  Take pain medicine only as directed by your caregiver.  Only take over-the-counter or prescription medicines for pain, discomfort, or fever as directed by your caregiver.  Try a clear liquid diet (broth, tea, or water) for as long as directed by your caregiver. Slowly move to a bland diet as tolerated. SEEK IMMEDIATE MEDICAL CARE IF:   The pain does not go away.  You have a fever.  You keep throwing up (vomiting).  The pain is felt only in portions of the abdomen. Pain in the right side could possibly be appendicitis. In an adult, pain in the left lower portion of the abdomen could be colitis or diverticulitis.  You pass bloody or black tarry stools. MAKE SURE YOU:   Understand these instructions.  Will watch your condition.  Will get help right away if you are not doing well or get worse. Document Released: 10/15/2004 Document Revised: 03/30/2011 Document Reviewed: 08/24/2007 [facility] Patient Information 2014 [place],  [place].  Acute Pancreatitis Acute pancreatitis is a disease in which the pancreas becomes suddenly inflamed. The pancreas is a large gland located behind your stomach. The pancreas produces enzymes that help digest food. The pancreas also releases the hormones glucagon and insulin that help regulate blood sugar. Damage to the pancreas occurs when the digestive enzymes from the pancreas are activated and begin attacking the pancreas before being released into the intestine. Most acute attacks last a couple of days and can cause serious complications. Some people become dehydrated and develop low blood pressure. In severe cases, bleeding into the pancreas can lead to shock and can be life-threatening. The lungs, heart, and kidneys may fail. CAUSES  Pancreatitis can happen to anyone. In some cases, the cause is unknown. Most cases are caused by:  Alcohol abuse.  Gallstones. Other less common causes are:  Certain medicines.  Exposure to certain chemicals.  Infection.  Damage caused by an accident (trauma).  Abdominal surgery. SYMPTOMS   Pain in the upper abdomen that may radiate to the back.  Tenderness and swelling of the abdomen.  Nausea and vomiting. DIAGNOSIS  Your caregiver will perform a physical exam. Blood and stool tests may be done to confirm the diagnosis. Imaging tests may also be done, such as X-rays, CT scans, or an ultrasound of the abdomen. TREATMENT  Treatment usually requires a stay in the hospital. Treatment may include:  Pain medicine.  Fluid replacement through an intravenous line (IV).  Placing a tube in the stomach to remove stomach  contents and control vomiting.  Not eating for 3 or 4 days. This gives your pancreas a rest, because enzymes are not being produced that can cause further damage.  Antibiotic medicines if your condition is caused by an infection.  Surgery of the pancreas or gallbladder. HOME CARE INSTRUCTIONS   Follow the diet advised by your  caregiver. This may involve avoiding alcohol and decreasing the amount of fat in your diet.  Eat smaller, more frequent meals. This reduces the amount of digestive juices the pancreas produces.  Drink enough fluids to keep your urine clear or pale yellow.  Only take over-the-counter or prescription medicines as directed by your caregiver.  Avoid drinking alcohol if it caused your condition.  Do not smoke.  Get plenty of rest.  Check your blood sugar at home as directed by your caregiver.  Keep all follow-up appointments as directed by your caregiver. SEEK MEDICAL CARE IF:   You do not recover as quickly as expected.  You develop new or worsening symptoms.  You have persistent pain, weakness, or nausea.  You recover and then have another episode of pain. SEEK IMMEDIATE MEDICAL CARE IF:   You are unable to eat or keep fluids down.  Your pain becomes severe.  You have a fever or persistent symptoms for more than 2 to 3 days.  You have a fever and your symptoms suddenly get worse.  Your skin or the white part of your eyes turn yellow (jaundice).  You develop vomiting.  You feel dizzy, or you faint.  Your blood sugar is high (over 300 mg/dL). MAKE SURE YOU:   Understand these instructions.  Will watch your condition.  Will get help right away if you are not doing well or get worse. Document Released: 01/05/2005 Document Revised: 07/07/2011 Document Reviewed: 04/16/2011 [facility] Patient Information 2014 [place], [place].

## 2012-06-24 ENCOUNTER — Encounter: Payer: Self-pay | Admitting: Internal Medicine

## 2012-06-24 NOTE — Assessment & Plan Note (Addendum)
Will call Dr. Cheral Marker [facility] and see further treatment recommendations and if this could be source of abdominal pain to continue narcotics.   Patient to schedule f/u at [facility]

## 2012-06-24 NOTE — Assessment & Plan Note (Addendum)
Left upper quadrant pain. Chronic at this point. 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 Christella Hartigan) stated cyst should not cause this much abdominal pain. Patient did just have acute pancreatitis and was hospitalized and discharged.  Referred to [facility] for second opinion and further w/u (Dr. Boyd Kerbs) who saw the patient 03/2012.  Will call Dr. Boyd Kerbs and disc the patient's case Until then had patient sign a pain contract for 2 months but will refer to the pain management clinic for chronic abdominal pain issues.  Gave him a 1 month supply today and he will be able to get only 1 more month of refills from [facility].  If he does not keep referral appt [facility] will not Rx Refill pain medications.  From now on once establishes with the pain clinic he is to get pain medication from that clinic.

## 2012-06-24 NOTE — Assessment & Plan Note (Signed)
He is following at [facility] they want to see him back 08/2012 for possibly cystoscopy and CT stone study

## 2012-06-24 NOTE — Assessment & Plan Note (Signed)
Slightly hypertensive today  Continue to monitor  Continue Norvasc and HCTZ

## 2012-06-24 NOTE — Progress Notes (Signed)
  Subjective:    Patient ID: Jerry Terrell, male    DOB: [birth year], 52 y.o.   MRN: [id number]  HPI Comments: 52 y.o PMH HCV, pancreatitis (recent hosp. D/c for acute on chronic pancreatitis with lipase was 2172 (05/27/12) and trended down to 45 on 5/26.  He denies alcohol use or abuse currently.  He c/o shooting, sharp abdominal pain (LUQ) greater than other quadrants that radiates throughout abdomen and to right flank and is intermittent.  Pain is 6-8/10.  Intermittent nausea denies vomiting.  Abdominal pain is aggravated by foods (spicy foods, sodas), lifting heavy objects.  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 Boyd Kerbs).  He is requesting Rx refill of pain medication.  He states he is unable to work due to abdominal pain and wants to apply for disability  Patient also mentions he saw [facility] for urinary frequency, incontinence, history of kidney stones, h/o prostatitis, and FH prostate cancer.  They put him on Ditropan 5 mg tid, Fluconazole 100 mg bid #6, and Flomax.  His pharmacy will not cover Flomax and trying to get switched to [facility].  They would like to see him back 08/2012 and possibly do cystoscopy and CT stone study.  They checked a PSA at last appt which was normal 0.42.    He reports a recent tick bite left thigh and is currently taking Doxycycline 100 mg bid.  He notes some residual swelling at area of tick bite and other bumps in the area near the bite.      Review of Systems  Respiratory: Negative for shortness of breath.   Gastrointestinal: Negative for constipation.  Musculoskeletal:       Left heel pain due to bone spur        Objective:   Physical Exam  Nursing note and vitals reviewed. Constitutional: He is oriented to person, place, and time. He appears well-developed and well-nourished. He is cooperative.  HENT:  Head: Normocephalic and atraumatic.  Mouth/Throat: No oropharyngeal exudate.  Eyes:  Conjunctivae are normal. Pupils are equal, round, and reactive to light. Right eye exhibits no discharge. Left eye exhibits no discharge. No scleral icterus.  Cardiovascular: Normal rate, regular rhythm, S1 normal, S2 normal and normal heart sounds.   No murmur heard. Pulmonary/Chest: Effort normal and breath sounds normal.  Abdominal: Soft. Bowel sounds are normal. He exhibits no distension. There is tenderness in the left upper quadrant.  Mild ttp LUQ  Musculoskeletal: He exhibits no edema.  Neurological: He is alert and oriented to person, place, and time. Gait normal.  Grossly neurologically intact  Skin: Skin is warm and dry. No rash noted.     Psychiatric: He has a normal mood and affect. His speech is normal and behavior is normal. Judgment and thought content normal. Cognition and memory are normal.          Assessment & Plan:  F/u in 3-6 months with Jerry Terrell  He should schedule urology and GI f/u at [facility]

## 2012-06-24 NOTE — Assessment & Plan Note (Signed)
Patient to follow with [facility]

## 2012-06-29 NOTE — Progress Notes (Signed)
TEACHING ATTENDING ADDENDUM: I discussed this case with Dr. Shirlee Latch at the time of the patient visit. I agree with the HPI, exam findings and have read the documentation provided by the resident,  and I concur with the plan of care. Please see the resident note for details of management.

## 2012-07-20 ENCOUNTER — Other Ambulatory Visit: Payer: Self-pay | Admitting: *Deleted

## 2012-07-20 MED ORDER — HYDROCODONE-ACETAMINOPHEN 10-300 MG PO TABS
1.0000 | ORAL_TABLET | Freq: Four times a day (QID) | ORAL | Status: DC | PRN
Start: 2012-07-20 — End: 2012-07-21

## 2012-07-20 NOTE — Addendum Note (Signed)
Addended by: Neomia Dear on: 07/20/2012 10:03 AM   Modules accepted: Orders

## 2012-07-20 NOTE — Telephone Encounter (Signed)
PT calls and states he needs more than the 112, he states he is "just suppose to call and it can be done", an appt was suggested.

## 2012-07-21 ENCOUNTER — Other Ambulatory Visit: Payer: Self-pay | Admitting: Internal Medicine

## 2012-07-21 ENCOUNTER — Telehealth: Payer: Self-pay | Admitting: *Deleted

## 2012-07-21 MED ORDER — HYDROCODONE-ACETAMINOPHEN 10-325 MG PO TABS: 1.0000 | ORAL_TABLET | Freq: Four times a day (QID) | ORAL | Status: DC | PRN

## 2012-07-21 NOTE — Telephone Encounter (Signed)
Called to [facility]

## 2012-07-21 NOTE — Telephone Encounter (Signed)
[facility], elizabeth calls and ask to change pain med from hydrocodone 10/300 to 10/325, this is what pt has been getting and as they do not stock 10/300, it would need to be ordered and pt would not receive until next week, i gave verbal ok, do you approve? And if so please change med list to reflect this, thanks, helen

## 2012-08-10 ENCOUNTER — Telehealth: Payer: Self-pay | Admitting: Internal Medicine

## 2012-08-10 ENCOUNTER — Encounter: Payer: Self-pay | Admitting: Internal Medicine

## 2012-08-10 NOTE — Telephone Encounter (Signed)
Pain clinic will not see the patient for epigastric pain.  Spoke with Dr. Boyd Kerbs at [facility].  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 Christella Hartigan did.  He rec. The patient should get a CT/MRI in 6-12 months to ensure stability of size per Dr. Boyd Kerbs.  If cyst is stable then 1-2 years imaging then if stable every 3 years imaging.  He sent the patient a letter about his findings.  No more narcotic pain medication for epigastric pain for pseudocyst related pain as this is not likely causing pain per two GI specialist.    Shirlee Latch MD

## 2012-08-23 ENCOUNTER — Ambulatory Visit: Payer: Self-pay

## 2012-09-02 ENCOUNTER — Ambulatory Visit: Payer: Self-pay

## 2012-09-22 ENCOUNTER — Telehealth: Payer: Self-pay | Admitting: *Deleted

## 2012-09-22 NOTE — Telephone Encounter (Signed)
Pt calls and speaks w/ front desk RE: needing an appt today, he is transferred to me and is upset that he was not given an appt for this am. i explained that the only appts open would be this pm and he is not happy, states the md told him he would be seen when he had a rash because it comes and goes so quickly, he was offered the 2 open appts on the schedule and he refused both. Offered to schedule him with his provider in October for f/u from his upcoming appts at [facility], he declined. He was then offered urg care this am and declined stating he would go to ED, it was explained that he may have to wait several hours in the ED and urg care usually has quicker turnaround, he states no he will go to ED because the urg care is not equipped to deal with whats wrong. He is wished well and told to call for a f/u appt when he likes.

## 2012-10-08 ENCOUNTER — Emergency Department (HOSPITAL_COMMUNITY)
Admission: EM | Admit: 2012-10-08 | Discharge: 2012-10-08 | Disposition: A | Discharge status: Home / Self Care | Payer: No Typology Code available for payment source | Attending: Emergency Medicine | Admitting: Emergency Medicine

## 2012-10-08 ENCOUNTER — Encounter (HOSPITAL_COMMUNITY): Payer: Self-pay | Admitting: Emergency Medicine

## 2012-10-08 DIAGNOSIS — F17200 Nicotine dependence, unspecified, uncomplicated: Secondary | ICD-10-CM | POA: Insufficient documentation

## 2012-10-08 DIAGNOSIS — G43909 Migraine, unspecified, not intractable, without status migrainosus: Secondary | ICD-10-CM | POA: Insufficient documentation

## 2012-10-08 DIAGNOSIS — Z8619 Personal history of other infectious and parasitic diseases: Secondary | ICD-10-CM | POA: Insufficient documentation

## 2012-10-08 DIAGNOSIS — I129 Hypertensive chronic kidney disease with stage 1 through stage 4 chronic kidney disease, or unspecified chronic kidney disease: Secondary | ICD-10-CM | POA: Insufficient documentation

## 2012-10-08 DIAGNOSIS — Z87448 Personal history of other diseases of urinary system: Secondary | ICD-10-CM | POA: Insufficient documentation

## 2012-10-08 DIAGNOSIS — K862 Cyst of pancreas: Secondary | ICD-10-CM | POA: Insufficient documentation

## 2012-10-08 DIAGNOSIS — Z79899 Other long term (current) drug therapy: Secondary | ICD-10-CM | POA: Insufficient documentation

## 2012-10-08 DIAGNOSIS — K859 Acute pancreatitis without necrosis or infection, unspecified: Secondary | ICD-10-CM | POA: Insufficient documentation

## 2012-10-08 DIAGNOSIS — Z862 Personal history of diseases of the blood and blood-forming organs and certain disorders involving the immune mechanism: Secondary | ICD-10-CM | POA: Insufficient documentation

## 2012-10-08 DIAGNOSIS — N189 Chronic kidney disease, unspecified: Secondary | ICD-10-CM | POA: Insufficient documentation

## 2012-10-08 DIAGNOSIS — Z8639 Personal history of other endocrine, nutritional and metabolic disease: Secondary | ICD-10-CM | POA: Insufficient documentation

## 2012-10-08 DIAGNOSIS — Z8709 Personal history of other diseases of the respiratory system: Secondary | ICD-10-CM | POA: Insufficient documentation

## 2012-10-08 DIAGNOSIS — J45909 Unspecified asthma, uncomplicated: Secondary | ICD-10-CM | POA: Insufficient documentation

## 2012-10-08 DIAGNOSIS — G8929 Other chronic pain: Secondary | ICD-10-CM | POA: Insufficient documentation

## 2012-10-08 DIAGNOSIS — K219 Gastro-esophageal reflux disease without esophagitis: Secondary | ICD-10-CM | POA: Insufficient documentation

## 2012-10-08 LAB — CBC WITH DIFFERENTIAL/PLATELET
Eosinophils Relative: 2 % (ref 0–5)
HCT: 50.1 % (ref 39.0–52.0)
Hemoglobin: 17.8 g/dL — ABNORMAL HIGH (ref 13.0–17.0)
Lymphocytes Relative: 45 % (ref 12–46)
Lymphs Abs: 2.9 10*3/uL (ref 0.7–4.0)
MCV: 89.9 fL (ref 78.0–100.0)
Monocytes Absolute: 0.5 10*3/uL (ref 0.1–1.0)
Monocytes Relative: 8 % (ref 3–12)
Platelets: 270 10*3/uL (ref 150–400)
RBC: 5.57 MIL/uL (ref 4.22–5.81)
WBC: 6.5 10*3/uL (ref 4.0–10.5)

## 2012-10-08 LAB — COMPREHENSIVE METABOLIC PANEL
ALT: 34 U/L (ref 0–53)
BUN: 12 mg/dL (ref 6–23)
CO2: 25 mEq/L (ref 19–32)
Calcium: 9.8 mg/dL (ref 8.4–10.5)
GFR calc Af Amer: 88 mL/min — ABNORMAL LOW (ref >90)
GFR calc non Af Amer: 76 mL/min — ABNORMAL LOW (ref >90)
Glucose, Bld: 150 mg/dL — ABNORMAL HIGH (ref 70–99)
Sodium: 134 mEq/L — ABNORMAL LOW (ref 135–145)

## 2012-10-08 LAB — URINALYSIS, ROUTINE W REFLEX MICROSCOPIC
Bilirubin Urine: NEGATIVE
Protein, ur: 100 mg/dL — AB
Urobilinogen, UA: 1 mg/dL (ref 0.0–1.0)

## 2012-10-08 LAB — URINE MICROSCOPIC-ADD ON

## 2012-10-08 MED ORDER — ONDANSETRON 8 MG PO TBDP: 8.0000 mg | ORAL_TABLET | Freq: Three times a day (TID) | ORAL | Status: DC | PRN

## 2012-10-08 MED ORDER — HYDROMORPHONE HCL PF 1 MG/ML IJ SOLN
1.0000 mg | Freq: Once | INTRAMUSCULAR | Status: AC
  Administered 2012-10-08: 1 mg via INTRAVENOUS
  Filled 2012-10-08: qty 1

## 2012-10-08 MED ORDER — ONDANSETRON HCL 4 MG/2ML IJ SOLN
INTRAMUSCULAR | Status: AC
  Administered 2012-10-08: 4 mg
  Filled 2012-10-08: qty 2

## 2012-10-08 MED ORDER — HYDROCODONE-ACETAMINOPHEN 5-325 MG PO TABS: 1.0000 | ORAL_TABLET | Freq: Four times a day (QID) | ORAL | Status: DC | PRN

## 2012-10-08 MED ORDER — SODIUM CHLORIDE 0.9 % IV SOLN: Freq: Once | INTRAVENOUS | Status: DC

## 2012-10-08 MED ORDER — SODIUM CHLORIDE 0.9 % IV BOLUS (SEPSIS)
1000.0000 mL | Freq: Once | INTRAVENOUS | Status: AC
  Administered 2012-10-08: 1000 mL via INTRAVENOUS

## 2012-10-08 NOTE — ED Notes (Signed)
States he is feeling much better,

## 2012-10-08 NOTE — ED Notes (Signed)
Pt sts hx of chronic pancreatitis and having increased pain x 1 week; pt sts N/V last night

## 2012-10-08 NOTE — ED Notes (Signed)
Patient c/o abd,. Pain onset yest. States he took his last pain pill and nausea pill last pm. States he is scheduled for procedure at [facility] on Monday . Rates pain 9/10.
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
## 2012-10-09 LAB — URINE CULTURE
Colony Count: NO GROWTH
Culture: NO GROWTH

## 2012-11-01 ENCOUNTER — Emergency Department (HOSPITAL_COMMUNITY): Payer: No Typology Code available for payment source

## 2012-11-01 ENCOUNTER — Emergency Department (HOSPITAL_COMMUNITY)
Admission: EM | Admit: 2012-11-01 | Discharge: 2012-11-01 | Disposition: A | Discharge status: Home / Self Care | Payer: No Typology Code available for payment source | Attending: Emergency Medicine | Admitting: Emergency Medicine

## 2012-11-01 ENCOUNTER — Encounter (HOSPITAL_COMMUNITY): Payer: Self-pay | Admitting: Emergency Medicine

## 2012-11-01 DIAGNOSIS — I10 Essential (primary) hypertension: Secondary | ICD-10-CM | POA: Insufficient documentation

## 2012-11-01 DIAGNOSIS — Z87898 Personal history of other specified conditions: Secondary | ICD-10-CM | POA: Insufficient documentation

## 2012-11-01 DIAGNOSIS — Z885 Allergy status to narcotic agent status: Secondary | ICD-10-CM | POA: Insufficient documentation

## 2012-11-01 DIAGNOSIS — N189 Chronic kidney disease, unspecified: Secondary | ICD-10-CM | POA: Insufficient documentation

## 2012-11-01 DIAGNOSIS — Z79899 Other long term (current) drug therapy: Secondary | ICD-10-CM | POA: Insufficient documentation

## 2012-11-01 DIAGNOSIS — Z8679 Personal history of other diseases of the circulatory system: Secondary | ICD-10-CM | POA: Insufficient documentation

## 2012-11-01 DIAGNOSIS — I129 Hypertensive chronic kidney disease with stage 1 through stage 4 chronic kidney disease, or unspecified chronic kidney disease: Secondary | ICD-10-CM | POA: Insufficient documentation

## 2012-11-01 DIAGNOSIS — R509 Fever, unspecified: Secondary | ICD-10-CM | POA: Insufficient documentation

## 2012-11-01 DIAGNOSIS — R109 Unspecified abdominal pain: Secondary | ICD-10-CM

## 2012-11-01 DIAGNOSIS — R0989 Other specified symptoms and signs involving the circulatory and respiratory systems: Secondary | ICD-10-CM | POA: Insufficient documentation

## 2012-11-01 DIAGNOSIS — R05 Cough: Secondary | ICD-10-CM

## 2012-11-01 DIAGNOSIS — Z8709 Personal history of other diseases of the respiratory system: Secondary | ICD-10-CM | POA: Insufficient documentation

## 2012-11-01 DIAGNOSIS — R110 Nausea: Secondary | ICD-10-CM | POA: Insufficient documentation

## 2012-11-01 DIAGNOSIS — Z8719 Personal history of other diseases of the digestive system: Secondary | ICD-10-CM | POA: Insufficient documentation

## 2012-11-01 DIAGNOSIS — K219 Gastro-esophageal reflux disease without esophagitis: Secondary | ICD-10-CM | POA: Insufficient documentation

## 2012-11-01 DIAGNOSIS — Z8669 Personal history of other diseases of the nervous system and sense organs: Secondary | ICD-10-CM | POA: Insufficient documentation

## 2012-11-01 DIAGNOSIS — Z87448 Personal history of other diseases of urinary system: Secondary | ICD-10-CM | POA: Insufficient documentation

## 2012-11-01 DIAGNOSIS — Z888 Allergy status to other drugs, medicaments and biological substances status: Secondary | ICD-10-CM | POA: Insufficient documentation

## 2012-11-01 DIAGNOSIS — G8929 Other chronic pain: Secondary | ICD-10-CM | POA: Insufficient documentation

## 2012-11-01 DIAGNOSIS — Z87891 Personal history of nicotine dependence: Secondary | ICD-10-CM | POA: Insufficient documentation

## 2012-11-01 DIAGNOSIS — J209 Acute bronchitis, unspecified: Secondary | ICD-10-CM | POA: Insufficient documentation

## 2012-11-01 DIAGNOSIS — B1920 Unspecified viral hepatitis C without hepatic coma: Secondary | ICD-10-CM | POA: Insufficient documentation

## 2012-11-01 DIAGNOSIS — J45909 Unspecified asthma, uncomplicated: Secondary | ICD-10-CM | POA: Insufficient documentation

## 2012-11-01 DIAGNOSIS — M549 Dorsalgia, unspecified: Secondary | ICD-10-CM | POA: Insufficient documentation

## 2012-11-01 DIAGNOSIS — R0609 Other forms of dyspnea: Secondary | ICD-10-CM | POA: Insufficient documentation

## 2012-11-01 DIAGNOSIS — Z8489 Family history of other specified conditions: Secondary | ICD-10-CM | POA: Insufficient documentation

## 2012-11-01 LAB — POCT I-STAT TROPONIN I: Troponin i, poc: 0 ng/mL (ref 0.00–0.08)

## 2012-11-01 LAB — COMPREHENSIVE METABOLIC PANEL
ALT: 53 U/L (ref 0–53)
BUN: 11 mg/dL (ref 6–23)
CO2: 25 mEq/L (ref 19–32)
Calcium: 9.9 mg/dL (ref 8.4–10.5)
GFR calc Af Amer: 75 mL/min — ABNORMAL LOW (ref >90)
GFR calc non Af Amer: 65 mL/min — ABNORMAL LOW (ref >90)
Glucose, Bld: 122 mg/dL — ABNORMAL HIGH (ref 70–99)
Potassium: 3.9 mEq/L (ref 3.5–5.1)
Sodium: 135 mEq/L (ref 135–145)
Total Protein: 8.5 g/dL — ABNORMAL HIGH (ref 6.0–8.3)

## 2012-11-01 LAB — CBC WITH DIFFERENTIAL/PLATELET
Basophils Relative: 1 % (ref 0–1)
Eosinophils Absolute: 0.2 10*3/uL (ref 0.0–0.7)
Eosinophils Relative: 3 % (ref 0–5)
Lymphs Abs: 2.4 10*3/uL (ref 0.7–4.0)
MCH: 31.7 pg (ref 26.0–34.0)
MCHC: 35.5 g/dL (ref 30.0–36.0)
MCV: 89.4 fL (ref 78.0–100.0)
Neutro Abs: 2.6 10*3/uL (ref 1.7–7.7)
Neutrophils Relative %: 43 % (ref 43–77)
Platelets: 279 10*3/uL (ref 150–400)
RBC: 5.77 MIL/uL (ref 4.22–5.81)

## 2012-11-01 LAB — LIPASE, BLOOD: Lipase: 28 U/L (ref 11–59)

## 2012-11-01 MED ORDER — GUAIFENESIN 100 MG/5ML PO SYRP: 100.0000 mg | ORAL_SOLUTION | ORAL | Status: DC | PRN

## 2012-11-01 MED ORDER — SODIUM CHLORIDE 0.9 % IV BOLUS (SEPSIS)
1000.0000 mL | Freq: Once | INTRAVENOUS | Status: AC
  Administered 2012-11-01: 1000 mL via INTRAVENOUS

## 2012-11-01 MED ORDER — HYDROMORPHONE HCL PF 1 MG/ML IJ SOLN
1.0000 mg | Freq: Once | INTRAMUSCULAR | Status: AC
  Administered 2012-11-01: 1 mg via INTRAVENOUS
  Filled 2012-11-01: qty 1

## 2012-11-01 MED ORDER — HYDROCODONE-ACETAMINOPHEN 5-325 MG PO TABS: 1.0000 | ORAL_TABLET | Freq: Four times a day (QID) | ORAL | Status: DC | PRN

## 2012-11-01 MED ORDER — ONDANSETRON HCL 4 MG/2ML IJ SOLN
4.0000 mg | Freq: Once | INTRAMUSCULAR | Status: AC
  Administered 2012-11-01: 4 mg via INTRAVENOUS
  Filled 2012-11-01: qty 2

## 2012-11-01 NOTE — ED Notes (Signed)
Pt c/o flu/cold symptoms. Stated that Saturday he started feeling like he had the flu or cold and it has caused his abdominal pain from pancreatitis to start hurting and bronchitis to start back. Pt stated that he has been coughing up yellow/green sputum. Stated that he has been having fevers. Pt is unable to take Tylenol because of Pancreatitis but did take one that caused his fever to come down but came right back. Pt has been having Chills as well.
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
## 2012-11-01 NOTE — ED Notes (Signed)
Results of troponin given to Dr. Lockwood 

## 2012-11-01 NOTE — ED Notes (Signed)
Dr. Jeraldine Loots aware patient requesting more pain medication. Verbal order received to reorder dose of dilaudid for patient.

## 2012-11-07 ENCOUNTER — Emergency Department (HOSPITAL_COMMUNITY)
Admission: EM | Admit: 2012-11-07 | Discharge: 2012-11-07 | Disposition: A | Discharge status: Home / Self Care | Payer: No Typology Code available for payment source | Attending: Emergency Medicine | Admitting: Emergency Medicine

## 2012-11-07 ENCOUNTER — Emergency Department (HOSPITAL_COMMUNITY): Payer: No Typology Code available for payment source

## 2012-11-07 ENCOUNTER — Encounter (HOSPITAL_COMMUNITY): Payer: Self-pay | Admitting: Emergency Medicine

## 2012-11-07 DIAGNOSIS — Z8669 Personal history of other diseases of the nervous system and sense organs: Secondary | ICD-10-CM | POA: Insufficient documentation

## 2012-11-07 DIAGNOSIS — K219 Gastro-esophageal reflux disease without esophagitis: Secondary | ICD-10-CM | POA: Insufficient documentation

## 2012-11-07 DIAGNOSIS — Z888 Allergy status to other drugs, medicaments and biological substances status: Secondary | ICD-10-CM | POA: Insufficient documentation

## 2012-11-07 DIAGNOSIS — G8929 Other chronic pain: Secondary | ICD-10-CM | POA: Insufficient documentation

## 2012-11-07 DIAGNOSIS — I129 Hypertensive chronic kidney disease with stage 1 through stage 4 chronic kidney disease, or unspecified chronic kidney disease: Secondary | ICD-10-CM | POA: Insufficient documentation

## 2012-11-07 DIAGNOSIS — J45909 Unspecified asthma, uncomplicated: Secondary | ICD-10-CM | POA: Insufficient documentation

## 2012-11-07 DIAGNOSIS — Z8709 Personal history of other diseases of the respiratory system: Secondary | ICD-10-CM | POA: Insufficient documentation

## 2012-11-07 DIAGNOSIS — Z87898 Personal history of other specified conditions: Secondary | ICD-10-CM | POA: Insufficient documentation

## 2012-11-07 DIAGNOSIS — Z79899 Other long term (current) drug therapy: Secondary | ICD-10-CM | POA: Insufficient documentation

## 2012-11-07 DIAGNOSIS — M549 Dorsalgia, unspecified: Secondary | ICD-10-CM | POA: Insufficient documentation

## 2012-11-07 DIAGNOSIS — K861 Other chronic pancreatitis: Secondary | ICD-10-CM | POA: Insufficient documentation

## 2012-11-07 DIAGNOSIS — B1920 Unspecified viral hepatitis C without hepatic coma: Secondary | ICD-10-CM | POA: Insufficient documentation

## 2012-11-07 DIAGNOSIS — Z8719 Personal history of other diseases of the digestive system: Secondary | ICD-10-CM | POA: Insufficient documentation

## 2012-11-07 DIAGNOSIS — R112 Nausea with vomiting, unspecified: Secondary | ICD-10-CM | POA: Insufficient documentation

## 2012-11-07 DIAGNOSIS — J209 Acute bronchitis, unspecified: Secondary | ICD-10-CM | POA: Insufficient documentation

## 2012-11-07 DIAGNOSIS — Z8489 Family history of other specified conditions: Secondary | ICD-10-CM | POA: Insufficient documentation

## 2012-11-07 DIAGNOSIS — R109 Unspecified abdominal pain: Secondary | ICD-10-CM

## 2012-11-07 DIAGNOSIS — N189 Chronic kidney disease, unspecified: Secondary | ICD-10-CM | POA: Insufficient documentation

## 2012-11-07 DIAGNOSIS — Z885 Allergy status to narcotic agent status: Secondary | ICD-10-CM | POA: Insufficient documentation

## 2012-11-07 DIAGNOSIS — J40 Bronchitis, not specified as acute or chronic: Secondary | ICD-10-CM

## 2012-11-07 DIAGNOSIS — F17200 Nicotine dependence, unspecified, uncomplicated: Secondary | ICD-10-CM | POA: Insufficient documentation

## 2012-11-07 LAB — URINALYSIS, ROUTINE W REFLEX MICROSCOPIC
Bilirubin Urine: NEGATIVE
Glucose, UA: NEGATIVE mg/dL
Ketones, ur: NEGATIVE mg/dL
Leukocytes, UA: NEGATIVE
Nitrite: NEGATIVE
Protein, ur: 30 mg/dL — AB
Specific Gravity, Urine: 1.018 (ref 1.005–1.030)
Urobilinogen, UA: 0.2 mg/dL (ref 0.0–1.0)
pH: 7.5 (ref 5.0–8.0)

## 2012-11-07 LAB — CBC WITH DIFFERENTIAL/PLATELET
Basophils Absolute: 0.1 10*3/uL (ref 0.0–0.1)
Basophils Relative: 1 % (ref 0–1)
Eosinophils Absolute: 0.2 10*3/uL (ref 0.0–0.7)
Eosinophils Relative: 3 % (ref 0–5)
HCT: 47.1 % (ref 39.0–52.0)
Hemoglobin: 16.9 g/dL (ref 13.0–17.0)
Lymphocytes Relative: 47 % — ABNORMAL HIGH (ref 12–46)
Lymphs Abs: 3.1 10*3/uL (ref 0.7–4.0)
MCH: 32.1 pg (ref 26.0–34.0)
MCHC: 35.9 g/dL (ref 30.0–36.0)
MCV: 89.4 fL (ref 78.0–100.0)
Monocytes Absolute: 0.6 10*3/uL (ref 0.1–1.0)
Monocytes Relative: 9 % (ref 3–12)
Neutro Abs: 2.6 10*3/uL (ref 1.7–7.7)
Neutrophils Relative %: 40 % — ABNORMAL LOW (ref 43–77)
Platelets: 251 10*3/uL (ref 150–400)
RBC: 5.27 MIL/uL (ref 4.22–5.81)
RDW: 12.9 % (ref 11.5–15.5)
WBC: 6.6 10*3/uL (ref 4.0–10.5)

## 2012-11-07 LAB — COMPREHENSIVE METABOLIC PANEL
AST: 23 U/L (ref 0–37)
Albumin: 3.5 g/dL (ref 3.5–5.2)
Alkaline Phosphatase: 117 U/L (ref 39–117)
CO2: 25 mEq/L (ref 19–32)
Chloride: 103 mEq/L (ref 96–112)
Glucose, Bld: 132 mg/dL — ABNORMAL HIGH (ref 70–99)
Potassium: 3.8 mEq/L (ref 3.5–5.1)
Total Bilirubin: 0.5 mg/dL (ref 0.3–1.2)

## 2012-11-07 LAB — CG4 I-STAT (LACTIC ACID): Lactic Acid, Venous: 1.74 mmol/L (ref 0.5–2.2)

## 2012-11-07 LAB — AMMONIA: Ammonia: 45 umol/L (ref 11–60)

## 2012-11-07 LAB — SAMPLE TO BLOOD BANK

## 2012-11-07 LAB — ETHANOL: Alcohol, Ethyl (B): <11 mg/dL (ref 0–11)

## 2012-11-07 LAB — URINE MICROSCOPIC-ADD ON

## 2012-11-07 LAB — RAPID URINE DRUG SCREEN, HOSP PERFORMED
Amphetamines: NOT DETECTED
Barbiturates: NOT DETECTED
Benzodiazepines: NOT DETECTED

## 2012-11-07 LAB — PROTIME-INR: Prothrombin Time: 11.8 seconds (ref 11.6–15.2)

## 2012-11-07 MED ORDER — HYDROMORPHONE HCL PF 1 MG/ML IJ SOLN
1.0000 mg | Freq: Once | INTRAMUSCULAR | Status: AC
  Administered 2012-11-07: 1 mg via INTRAVENOUS
  Filled 2012-11-07: qty 1

## 2012-11-07 MED ORDER — HYDROCODONE-ACETAMINOPHEN 5-325 MG PO TABS: 1.0000 | ORAL_TABLET | ORAL | Status: DC | PRN

## 2012-11-07 MED ORDER — ONDANSETRON HCL 4 MG/2ML IJ SOLN
4.0000 mg | Freq: Once | INTRAMUSCULAR | Status: AC
  Administered 2012-11-07: 4 mg via INTRAVENOUS
  Filled 2012-11-07: qty 2

## 2012-11-07 MED ORDER — HYDROCODONE-ACETAMINOPHEN 5-325 MG PO TABS
1.0000 | ORAL_TABLET | Freq: Once | ORAL | Status: AC
  Administered 2012-11-07: 1 via ORAL
  Filled 2012-11-07: qty 1

## 2012-11-07 MED ORDER — AZITHROMYCIN 250 MG PO TABS: ORAL_TABLET | ORAL | Status: DC

## 2012-11-07 MED ORDER — SODIUM CHLORIDE 0.9 % IV SOLN
INTRAVENOUS | Status: DC
  Administered 2012-11-07: 08:00:00 via INTRAVENOUS

## 2012-11-07 NOTE — ED Notes (Signed)
MD at bedside. 

## 2012-11-07 NOTE — ED Notes (Signed)
Pt reports increased cough with blood tinged mucous. Pt reports cough worse in AM.
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
## 2012-11-17 ENCOUNTER — Encounter: Payer: Self-pay | Admitting: Internal Medicine

## 2012-11-17 ENCOUNTER — Ambulatory Visit (INDEPENDENT_AMBULATORY_CARE_PROVIDER_SITE_OTHER): Payer: No Typology Code available for payment source | Admitting: Internal Medicine

## 2012-11-17 VITALS — BP 153/98 | HR 83 | Temp 99.1°F | Ht 68.0 in | Wt 171.1 lb

## 2012-11-17 DIAGNOSIS — K862 Cyst of pancreas: Secondary | ICD-10-CM

## 2012-11-17 DIAGNOSIS — N419 Inflammatory disease of prostate, unspecified: Secondary | ICD-10-CM

## 2012-11-17 DIAGNOSIS — R109 Unspecified abdominal pain: Secondary | ICD-10-CM

## 2012-11-17 DIAGNOSIS — I10 Essential (primary) hypertension: Secondary | ICD-10-CM

## 2012-11-17 DIAGNOSIS — F411 Generalized anxiety disorder: Secondary | ICD-10-CM

## 2012-11-17 DIAGNOSIS — F17200 Nicotine dependence, unspecified, uncomplicated: Secondary | ICD-10-CM

## 2012-11-17 LAB — COMPLETE METABOLIC PANEL WITH GFR
ALT: 51 U/L (ref 0–53)
Alkaline Phosphatase: 107 U/L (ref 39–117)
CO2: 27 mEq/L (ref 19–32)
Chloride: 103 mEq/L (ref 96–112)
Creat: 1.14 mg/dL (ref 0.50–1.35)
GFR, Est African American: 84 mL/min
GFR, Est Non African American: 73 mL/min
Glucose, Bld: 90 mg/dL (ref 70–99)
Total Bilirubin: 1.4 mg/dL — ABNORMAL HIGH (ref 0.3–1.2)

## 2012-11-17 LAB — CBC WITH DIFFERENTIAL/PLATELET
Basophils Relative: 0 % (ref 0–1)
Eosinophils Absolute: 0.1 10*3/uL (ref 0.0–0.7)
HCT: 49.4 % (ref 39.0–52.0)
Hemoglobin: 17.4 g/dL — ABNORMAL HIGH (ref 13.0–17.0)
Lymphocytes Relative: 47 % — ABNORMAL HIGH (ref 12–46)
Lymphs Abs: 3.6 10*3/uL (ref 0.7–4.0)
MCHC: 35.2 g/dL (ref 30.0–36.0)
Monocytes Absolute: 0.6 10*3/uL (ref 0.1–1.0)
Neutro Abs: 3.5 10*3/uL (ref 1.7–7.7)
Neutrophils Relative %: 45 % (ref 43–77)
Platelets: 316 10*3/uL (ref 150–400)
RBC: 5.54 MIL/uL (ref 4.22–5.81)
WBC: 7.8 10*3/uL (ref 4.0–10.5)

## 2012-11-17 LAB — LIPASE: Lipase: 23 U/L (ref 0–75)

## 2012-11-17 MED ORDER — SERTRALINE HCL 25 MG PO TABS: 25.0000 mg | ORAL_TABLET | Freq: Every day | ORAL | Status: DC

## 2012-11-17 MED ORDER — OXYCODONE-ACETAMINOPHEN 10-325 MG PO TABS: 1.0000 | ORAL_TABLET | Freq: Three times a day (TID) | ORAL | Status: DC | PRN

## 2012-11-17 MED ORDER — ONDANSETRON HCL 4 MG PO TABS: 4.0000 mg | ORAL_TABLET | Freq: Three times a day (TID) | ORAL | Status: DC | PRN

## 2012-11-17 MED ORDER — SERTRALINE HCL 50 MG PO TABS: 50.0000 mg | ORAL_TABLET | Freq: Every day | ORAL | Status: DC

## 2012-11-17 NOTE — Assessment & Plan Note (Signed)
BP Readings from Last 3 Encounters:  11/17/12 153/98  11/07/12 127/71  11/01/12 133/82    Lab Results  Component Value Date   NA 138 11/07/2012   K 3.8 11/07/2012   CREATININE 1.06 11/07/2012    Assessment: Blood pressure control: mildly elevated Progress toward BP goal:  deteriorated (likely elevated 2/2 pain ) Comments: see above   Plan: Medications:  continue current medications (Norvasc 10, HCTZ 25 mg) Educational resources provided: brochure Self management tools provided:   Other plans: reassess at f/u in 2 months

## 2012-11-17 NOTE — Progress Notes (Signed)
Subjective:    Patient ID: Jerry Terrell, male    DOB: June 28, 1960, 52 y.o.   MRN: [id number]  HPI Comments: 52 y.o male Past Medical History asthma, CAD, Bronchitis, chronic h/a, HCV + (follows with [facility] who may be putting him on a new drug 01/2013), Chronic pain (back, abdomen), Hypertension (BP 153/98 today),GERD (gastroesophageal reflux disease), chronic prostatitis (follows w/ urology at [facility]), H/O hiatal hernia, h/o chronic pancreatitis, History of stomach ulcers, Pancreatic cyst (being followed by GI at [facility]), simple cyst right kidney.   He presents for abdominal pain (LUQ radiating to RUQ, RLQ) throbbing, shooting, sharp in nature today 11-12/10 not relieved by Norco or Norco/Vicodin which is now out of pills.  He has assoc. Nausea/vomiting.  He vomited bile contents today x 2.  He states sometimes pain triggers vomiting.  He does not have Zofran ODT for nausea due to affordability.  He is able to tolerate orals if he blends or purees his food contents or boils his meat then blends.  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  He had imaging of he abdomen recently which showed the lesion now 2.4 cm x 2.3 cm x 1.3 cm in the uncinate process and no evidence of malignancy.  Last imaging study at [facility] shows the lesion was 1.9 cm x 1.0 cm.                                             He also c/o panic attacks and anxiety triggered by his health concerns and pain.  When he is anxious he starts sweating with increased HR.  He took a medication in the past for anxiety which he cant recall the name and his anxiety improved.    SH: previously incarcerated.  Currently not able to work due to health     Review of Systems  Constitutional: Negative for fever and chills.       Decreased tolerability of foods   Respiratory: Negative for cough.    Cardiovascular: Negative for chest pain.  Gastrointestinal: Positive for nausea, vomiting and abdominal pain.  Genitourinary:       +Urinary incontinence   Neurological: Positive for headaches.  Psychiatric/Behavioral: The patient is nervous/anxious.        Objective:   Physical Exam  Nursing note and vitals reviewed. Constitutional: He is oriented to person, place, and time. He appears well-developed and well-nourished. He is cooperative.  Tearful on exam  HENT:  Head: Normocephalic and atraumatic.  Mouth/Throat: Oropharynx is clear and moist and mucous membranes are normal. No oropharyngeal exudate.  Eyes: Right eye exhibits no discharge. Left eye exhibits no discharge. Right conjunctiva is injected. Left conjunctiva is injected. No scleral icterus.  Cardiovascular: Normal rate, regular rhythm, S1 normal, S2 normal and normal heart sounds.   No murmur heard. No lower extremity edema   Pulmonary/Chest: Effort normal and breath sounds normal.  Abdominal: Soft. Bowel sounds are normal. He exhibits no distension. There is tenderness in the right upper quadrant, epigastric area and left upper quadrant.  Musculoskeletal: He exhibits no edema.  Neurological: He is alert and oriented to person, place, and time. Gait normal.  Skin: Skin is warm, dry and intact. No rash noted.  Psychiatric: His speech is normal  and behavior is normal. Judgment and thought content normal. Cognition and memory are normal.  Tearful on exam           Assessment & Plan:  F/u with surgical oncology then PCP

## 2012-11-17 NOTE — Assessment & Plan Note (Signed)
Follows with [facility] urology 12/28/12 at 12:30 PM

## 2012-11-17 NOTE — Assessment & Plan Note (Signed)
Patient has health and financial stressors  Will start Zoloft 25 mg x 7 days then increased to 50 mg qd x 2 months F/u in 2 months to reassess

## 2012-11-17 NOTE — Assessment & Plan Note (Signed)
  Assessment: Progress toward smoking cessation:  smoking the same amount Barriers to progress toward smoking cessation:  stress Comments: +THC  Plan: Instruction/counseling given:  I counseled patient on the dangers of tobacco use, advised patient to stop smoking, and reviewed strategies to maximize success. Educational resources provided:  other (see comments) Self management tools provided: none Medications to assist with smoking cessation:  None Patient agreed to the following self-care plans for smoking cessation: not assessed  Other plans: none

## 2012-11-17 NOTE — Patient Instructions (Addendum)
General Instructions: Please take Zoloft 25 mg x 7 days then start taking 50 mg  Follow up in 2 months Follow up with surgical oncology  I will give you enough pain pills to last until we hear from surgical oncology    Treatment Goals:  Goals (1 Years of Data) as of 11/17/12         As of Today 11/07/12 11/07/12 11/07/12 11/07/12     Blood Pressure    . Blood Pressure < 140/90  153/98 127/71 133/67 130/78 139/79      Progress Toward Treatment Goals:  Treatment Goal 11/17/2012  Blood pressure deteriorated  Stop smoking smoking the same amount    Self Care Goals & Plans:  Self Care Goal 11/17/2012  Manage my medications take my medicines as prescribed; refill my medications on time  Monitor my health keep track of my blood pressure  Eat healthy foods drink diet soda or water instead of juice or soda; eat more vegetables; eat foods that are low in salt; eat baked foods instead of fried foods; eat fruit for snacks and desserts; eat smaller portions  Be physically active take the stairs instead of the elevator  Stop smoking -  Meeting treatment goals maintain the current self-care plan    No flowsheet data found.   Care Management & Community Referrals:  Referral 11/17/2012  Referrals made for care management support none needed  Referrals made to community resources none        Sertraline oral solution What is this medicine? SERTRALINE (SER tra leen) is used to treat depression. It may also be used to treat obsessive compulsive disorder, panic disorder, post-trauma stress, premenstrual dysphoric disorder (PMDD) or social anxiety. This medicine may be used for other purposes; ask your health care provider or pharmacist if you have questions. What should I tell my health care provider before I take this medicine? They need to know if you have any of these conditions: -bipolar disorder or a family history of bipolar disorder -diabetes -heart disease -liver  disease -receiving electroconvulsive therapy -seizures (convulsions) -suicidal thoughts, plans, or attempt by you or a family member -an unusual or allergic reaction to sertraline, other medicines, foods, dyes, or preservatives -pregnant or trying to get pregnant -breast-feeding How should I use this medicine? Take this medicine by mouth. Follow the directions on the prescription label. Before taking your dose, you need to dilute the solution in a beverage. Measure your medicine dose using the dropper in the bottle. Next, place the measured dose in at least 4 ounces (one-half cup) of water, ginger-ale, lemon-lime soda, lemonade or orange juice and mix. Do not mix in grapefruit juice or in any other liquids other than those listed. Drink all of mixed liquid immediately. Do not mix the dose and store it for later. It must be taken right away. You may take this medicine with or without food. Take your medicine at regular intervals. Do not take your medicine more often than directed. Do not stop taking this medicine suddenly except upon the advice of your doctor. Stopping this medicine too quickly may cause serious side effects or your condition may worsen. A special MedGuide will be given to you by the pharmacist with each prescription and refill. Be sure to read this information carefully each time. Talk to your pediatrician regarding the use of this medicine in children. While this drug may be prescribed for children as young as 7 years for selected conditions, precautions do apply. Overdosage: If  you think you have taken too much of this medicine contact a poison control center or emergency room at once. NOTE: This medicine is only for you. Do not share this medicine with others. What if I miss a dose? If you miss a dose, take it as soon as you can. If it is almost time for your next dose, take only that dose. Do not take double or extra doses. What may interact with this medicine? Do not take this  medicine with any of the following medications: -disulfiram -linezolid -MAOIs like Carbex, Eldepryl, Marplan, Nardil, and Parnate -metronidazole -methylene blue (injected into a vein) -pimozide -thioridazine This medicine may also interact with the following medications: -alcohol -aspirin and aspirin-like medicines -certain medicines for depression, anxiety, or psychotic disturbances -certain medicines for migraine headaches like almotriptan, eletriptan, frovatriptan, naratriptan, rizatriptan, sumatriptan, zolmitriptan -certain medicines for seizures like carbamazepine, valproic acid, phenytoin -cimetidine -digoxin -diuretics -fentanyl -furazolidone -isoniazid -lithium -medicines that treat or prevent blood clots like warfarin, enoxaparin, and dalteparin -medicines to control heart rhythm like flecainide or propafenone -medicines for sleep -NSAIDs, medicines for pain and inflammation, like ibuprofen or naproxen -procarbazine -rasagiline -supplements like St. John's wort, kava kava, valerian -tolbutamide -tramadol -tryptophan This list may not describe all possible interactions. Give your health care provider a list of all the medicines, herbs, non-prescription drugs, or dietary supplements you use. Also tell them if you smoke, drink alcohol, or use illegal drugs. Some items may interact with your medicine. What should I watch for while using this medicine? Tell your doctor if your symptoms do not get better or if they get worse. Visit your doctor or health care professional for regular checks on your progress. Because it may take several weeks to see the full effects of this medicine, it is important to continue your treatment as prescribed by your doctor. Patients and their families should watch out for new or worsening thoughts of suicide or depression. Also watch out for sudden changes in feelings such as feeling anxious, agitated, panicky, irritable, hostile, aggressive,  impulsive, severely restless, overly excited and hyperactive, or not being able to sleep. If this happens, especially at the beginning of treatment or after a change in dose, call your health care professional. Bonita Quin may get drowsy or dizzy. Do not drive, use machinery, or do anything that needs mental alertness until you know how this medicine affects you. Do not stand or sit up quickly, especially if you are an older patient. This reduces the risk of dizzy or fainting spells. Alcohol may interfere with the effect of this medicine. Avoid alcoholic drinks. This medicine contains a high percentage of alcohol that may interact with medicines used to treat alcohol abuse, like Antabuse (disulfiram). You should not take these medicines together. Your mouth may get dry. Chewing sugarless gum or sucking hard candy, and drinking plenty of water may help. Contact your doctor if the problem does not go away or is severe. What side effects may I notice from receiving this medicine? Side effects that you should report to your doctor or health care professional as soon as possible: -allergic reactions like skin rash, itching or hives, swelling of the face, lips, or tongue -black or bloody stools, blood in the urine or vomit -fast, irregular heartbeat -feeling faint or lightheaded, falls -hallucination, loss of contact with reality -seizures -suicidal thoughts or other mood changes -unusual bleeding or bruising -unusually weak or tired -vomiting Side effects that usually do not require medical attention (report to your doctor or  health care professional if they continue or are bothersome): -change in appetite -change in sex drive or performance -diarrhea -indigestion, nausea -increased sweating -tremors This list may not describe all possible side effects. Call your doctor for medical advice about side effects. You may report side effects to FDA at 1-800-FDA-1088. Where should I keep my medicine? Keep out  of the reach of children. Store at room temperature between 15 and 30 degrees C (59 and 86 degrees F). Throw away any unused medicine after the expiration date. NOTE: This sheet is a summary. It may not cover all possible information. If you have questions about this medicine, talk to your doctor, pharmacist, or health care provider.  2013, [employer]. (05/22/2011 8:58:17 PM)  Nausea and Vomiting Nausea is a sick feeling that often comes before throwing up (vomiting). Vomiting is a reflex where stomach contents come out of your mouth. Vomiting can cause severe loss of body fluids (dehydration). Children and elderly adults can become dehydrated quickly, especially if they also have diarrhea. Nausea and vomiting are symptoms of a condition or disease. It is important to find the cause of your symptoms. CAUSES   Direct irritation of the stomach lining. This irritation can result from increased acid production (gastroesophageal reflux disease), infection, food poisoning, taking certain medicines (such as nonsteroidal anti-inflammatory drugs), alcohol use, or tobacco use.  Signals from the brain.These signals could be caused by a headache, heat exposure, an inner ear disturbance, increased pressure in the brain from injury, infection, a tumor, or a concussion, pain, emotional stimulus, or metabolic problems.  An obstruction in the gastrointestinal tract (bowel obstruction).  Illnesses such as diabetes, hepatitis, gallbladder problems, appendicitis, kidney problems, cancer, sepsis, atypical symptoms of a heart attack, or eating disorders.  Medical treatments such as chemotherapy and radiation.  Receiving medicine that makes you sleep (general anesthetic) during surgery. DIAGNOSIS Your caregiver may ask for tests to be done if the problems do not improve after a few days. Tests may also be done if symptoms are severe or if the reason for the nausea and vomiting is not clear. Tests may  include:  Urine tests.  Blood tests.  Stool tests.  Cultures (to look for evidence of infection).  X-rays or other imaging studies. Test results can help your caregiver make decisions about treatment or the need for additional tests. TREATMENT You need to stay well hydrated. Drink frequently but in small amounts.You may wish to drink water, sports drinks, clear broth, or eat frozen ice pops or gelatin dessert to help stay hydrated.When you eat, eating slowly may help prevent nausea.There are also some antinausea medicines that may help prevent nausea. HOME CARE INSTRUCTIONS   Take all medicine as directed by your caregiver.  If you do not have an appetite, do not force yourself to eat. However, you must continue to drink fluids.  If you have an appetite, eat a normal diet unless your caregiver tells you differently.  Eat a variety of complex carbohydrates (rice, wheat, potatoes, bread), lean meats, yogurt, fruits, and vegetables.  Avoid high-fat foods because they are more difficult to digest.  Drink enough water and fluids to keep your urine clear or pale yellow.  If you are dehydrated, ask your caregiver for specific rehydration instructions. Signs of dehydration may include:  Severe thirst.  Dry lips and mouth.  Dizziness.  Dark urine.  Decreasing urine frequency and amount.  Confusion.  Rapid breathing or pulse. SEEK IMMEDIATE MEDICAL CARE IF:   You have blood  or brown flecks (like coffee grounds) in your vomit.  You have black or bloody stools.  You have a severe headache or stiff neck.  You are confused.  You have severe abdominal pain.  You have chest pain or trouble breathing.  You do not urinate at least once every 8 hours.  You develop cold or clammy skin.  You continue to vomit for longer than 24 to 48 hours.  You have a fever. MAKE SURE YOU:   Understand these instructions.  Will watch your condition.  Will get help right away if  you are not doing well or get worse. Document Released: 01/05/2005 Document Revised: 03/30/2011 Document Reviewed: 06/04/2010 [facility] Patient Information 2014 [place], [place].  Hypertension As your heart beats, it forces blood through your arteries. This force is your blood pressure. If the pressure is too high, it is called hypertension (HTN) or high blood pressure. HTN is dangerous because you may have it and not know it. High blood pressure may mean that your heart has to work harder to pump blood. Your arteries may be narrow or stiff. The extra work puts you at risk for heart disease, stroke, and other problems.  Blood pressure consists of two numbers, a higher number over a lower, 110/72, for example. It is stated as "110 over 72." The ideal is below 120 for the top number (systolic) and under 80 for the bottom (diastolic). Write down your blood pressure today. You should pay close attention to your blood pressure if you have certain conditions such as:  Heart failure.  Prior heart attack.  Diabetes  Chronic kidney disease.  Prior stroke.  Multiple risk factors for heart disease. To see if you have HTN, your blood pressure should be measured while you are seated with your arm held at the level of the heart. It should be measured at least twice. A one-time elevated blood pressure reading (especially in the Emergency Department) does not mean that you need treatment. There may be conditions in which the blood pressure is different between your right and left arms. It is important to see your caregiver soon for a recheck. Most people have essential hypertension which means that there is not a specific cause. This type of high blood pressure may be lowered by changing lifestyle factors such as:  Stress.  Smoking.  Lack of exercise.  Excessive weight.  Drug/tobacco/alcohol use.  Eating less salt. Most people do not have symptoms from high blood pressure until it has caused damage  to the body. Effective treatment can often prevent, delay or reduce that damage. TREATMENT  When a cause has been identified, treatment for high blood pressure is directed at the cause. There are a large number of medications to treat HTN. These fall into several categories, and your caregiver will help you select the medicines that are best for you. Medications may have side effects. You should review side effects with your caregiver. If your blood pressure stays high after you have made lifestyle changes or started on medicines,   Your medication(s) may need to be changed.  Other problems may need to be addressed.  Be certain you understand your prescriptions, and know how and when to take your medicine.  Be sure to follow up with your caregiver within the time frame advised (usually within two weeks) to have your blood pressure rechecked and to review your medications.  If you are taking more than one medicine to lower your blood pressure, make sure you know how  and at what times they should be taken. Taking two medicines at the same time can result in blood pressure that is too low. SEEK IMMEDIATE MEDICAL CARE IF:  You develop a severe headache, blurred or changing vision, or confusion.  You have unusual weakness or numbness, or a faint feeling.  You have severe chest or abdominal pain, vomiting, or breathing problems. MAKE SURE YOU:   Understand these instructions.  Will watch your condition.  Will get help right away if you are not doing well or get worse. Document Released: 01/05/2005 Document Revised: 03/30/2011 Document Reviewed: 08/26/2007 [facility] Patient Information 2014 [place], [place]. Abdominal Pain Abdominal pain can be caused by many things. Your caregiver decides the seriousness of your pain by an examination and possibly blood tests and X-rays. Many cases can be observed and treated at home. Most abdominal pain is not caused by a disease and will probably improve  without treatment. However, in many cases, more time must pass before a clear cause of the pain can be found. Before that point, it may not be known if you need more testing, or if hospitalization or surgery is needed. HOME CARE INSTRUCTIONS   Do not take laxatives unless directed by your caregiver.  Take pain medicine only as directed by your caregiver.  Only take over-the-counter or prescription medicines for pain, discomfort, or fever as directed by your caregiver.  Try a clear liquid diet (broth, tea, or water) for as long as directed by your caregiver. Slowly move to a bland diet as tolerated. SEEK IMMEDIATE MEDICAL CARE IF:   The pain does not go away.  You have a fever.  You keep throwing up (vomiting).  The pain is felt only in portions of the abdomen. Pain in the right side could possibly be appendicitis. In an adult, pain in the left lower portion of the abdomen could be colitis or diverticulitis.  You pass bloody or black tarry stools. MAKE SURE YOU:   Understand these instructions.  Will watch your condition.  Will get help right away if you are not doing well or get worse. Document Released: 10/15/2004 Document Revised: 03/30/2011 Document Reviewed: 08/24/2007 [facility] Patient Information 2014 [place], [place].  Smoking Cessation Quitting smoking is important to your health and has many advantages. However, it is not always easy to quit since nicotine is a very addictive drug. Often times, people try 3 times or more before being able to quit. This document explains the best ways for you to prepare to quit smoking. Quitting takes hard work and a lot of effort, but you can do it. ADVANTAGES OF QUITTING SMOKING  You will live longer, feel better, and live better.  Your body will feel the impact of quitting smoking almost immediately.  Within 20 minutes, blood pressure decreases. Your pulse returns to its normal level.  After 8 hours, carbon monoxide levels in the  blood return to normal. Your oxygen level increases.  After 24 hours, the chance of having a heart attack starts to decrease. Your breath, hair, and body stop smelling like smoke.  After 48 hours, damaged nerve endings begin to recover. Your sense of taste and smell improve.  After 72 hours, the body is virtually free of nicotine. Your bronchial tubes relax and breathing becomes easier.  After 2 to 12 weeks, lungs can hold more air. Exercise becomes easier and circulation improves.  The risk of having a heart attack, stroke, cancer, or lung disease is greatly reduced.  After 1 year, the  risk of coronary heart disease is cut in half.  After 5 years, the risk of stroke falls to the same as a nonsmoker.  After 10 years, the risk of lung cancer is cut in half and the risk of other cancers decreases significantly.  After 15 years, the risk of coronary heart disease drops, usually to the level of a nonsmoker.  If you are pregnant, quitting smoking will improve your chances of having a healthy baby.  The people you live with, especially any children, will be healthier.  You will have extra money to spend on things other than cigarettes. QUESTIONS TO THINK ABOUT BEFORE ATTEMPTING TO QUIT You may want to talk about your answers with your caregiver.  Why do you want to quit?  If you tried to quit in the past, what helped and what did not?  What will be the most difficult situations for you after you quit? How will you plan to handle them?  Who can help you through the tough times? Your family? Friends? A caregiver?  What pleasures do you get from smoking? What ways can you still get pleasure if you quit? Here are some questions to ask your caregiver:  How can you help me to be successful at quitting?  What medicine do you think would be best for me and how should I take it?  What should I do if I need more help?  What is smoking withdrawal like? How can I get information on  withdrawal? GET READY  Set a quit date.  Change your environment by getting rid of all cigarettes, ashtrays, matches, and lighters in your home, car, or work. Do not let people smoke in your home.  Review your past attempts to quit. Think about what worked and what did not. GET SUPPORT AND ENCOURAGEMENT You have a better chance of being successful if you have help. You can get support in many ways.  Tell your family, friends, and co-workers that you are going to quit and need their support. Ask them not to smoke around you.  Get individual, group, or telephone counseling and support. Programs are available at [employer] and health centers. Call your local health department for information about programs in your area.  Spiritual beliefs and practices may help some smokers quit.  Download a "quit meter" on your computer to keep track of quit statistics, such as how long you have gone without smoking, cigarettes not smoked, and money saved.  Get a self-help book about quitting smoking and staying off of tobacco. LEARN NEW SKILLS AND BEHAVIORS  Distract yourself from urges to smoke. Talk to someone, go for a walk, or occupy your time with a task.  Change your normal routine. Take a different route to work. Drink tea instead of coffee. Eat breakfast in a different place.  Reduce your stress. Take a hot bath, exercise, or read a book.  Plan something enjoyable to do every day. Reward yourself for not smoking.  Explore interactive web-based programs that specialize in helping you quit. GET MEDICINE AND USE IT CORRECTLY Medicines can help you stop smoking and decrease the urge to smoke. Combining medicine with the above behavioral methods and support can greatly increase your chances of successfully quitting smoking.  Nicotine replacement therapy helps deliver nicotine to your body without the negative effects and risks of smoking. Nicotine replacement therapy includes nicotine gum,  lozenges, inhalers, nasal sprays, and skin patches. Some may be available over-the-counter and others require a prescription.  Antidepressant medicine helps people abstain from smoking, but how this works is unknown. This medicine is available by prescription.  Nicotinic receptor partial agonist medicine simulates the effect of nicotine in your brain. This medicine is available by prescription. Ask your caregiver for advice about which medicines to use and how to use them based on your health history. Your caregiver will tell you what side effects to look out for if you choose to be on a medicine or therapy. Carefully read the information on the package. Do not use any other product containing nicotine while using a nicotine replacement product.  RELAPSE OR DIFFICULT SITUATIONS Most relapses occur within the first 3 months after quitting. Do not be discouraged if you start smoking again. Remember, most people try several times before finally quitting. You may have symptoms of withdrawal because your body is used to nicotine. You may crave cigarettes, be irritable, feel very hungry, cough often, get headaches, or have difficulty concentrating. The withdrawal symptoms are only temporary. They are strongest when you first quit, but they will go away within 10 14 days. To reduce the chances of relapse, try to:  Avoid drinking alcohol. Drinking lowers your chances of successfully quitting.  Reduce the amount of caffeine you consume. Once you quit smoking, the amount of caffeine in your body increases and can give you symptoms, such as a rapid heartbeat, sweating, and anxiety.  Avoid smokers because they can make you want to smoke.  Do not let weight gain distract you. Many smokers will gain weight when they quit, usually less than 10 pounds. Eat a healthy diet and stay active. You can always lose the weight gained after you quit.  Find ways to improve your mood other than smoking. FOR MORE INFORMATION    www.smokefree.gov  Document Released: 12/30/2000 Document Revised: 07/07/2011 Document Reviewed: 04/16/2011 [facility] Patient Information 2014 [place], [place].

## 2012-11-17 NOTE — Assessment & Plan Note (Signed)
Size increasing on imaging. [facility] will refer to surgical oncology affiliated for further w/u  Pt thinks this is source of abdominal pain  Will Rx temporary Rx for Percocet 10-325 mg tid prn #90 until can f/u with surgical oncology who I hope will manage pain from that point He is having associated n/v Rx Zofran 4 mg prn #30 RF x 1.  Given good Rx Coupon today  CMET, CBC, lipase ordered

## 2012-11-17 NOTE — Assessment & Plan Note (Signed)
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 [facility] will refer to surgical oncology affiliated with [facility] for further w/u  Pt thinks this is source of abdominal pain though I am unsure and have asked Dr. Boyd Kerbs [facility] GI) in the past and Dr. Christella Hartigan both who do not think pain is due to pancreatic cyst.  He also has a h/o chronic pancreatitis  Will Rx temporary Rx for Percocet 10-325 mg tid prn #90 until can f/u with surgical oncology who I hope will manage pain from that point He is having associated n/v Rx Zofran 4 mg prn #30 RF x 1.  Given good Rx Coupon today  CMET, CBC, lipase ordered

## 2012-11-18 ENCOUNTER — Encounter: Payer: Self-pay | Admitting: Internal Medicine

## 2012-11-28 NOTE — Progress Notes (Signed)
Case discussed with Dr. McLean at the time of the visit.  We reviewed the resident's history and exam and pertinent patient test results.  I agree with the assessment, diagnosis and plan of care documented in the resident's note. 

## 2012-12-08 ENCOUNTER — Ambulatory Visit: Payer: Self-pay | Admitting: Internal Medicine

## 2012-12-08 ENCOUNTER — Telehealth: Payer: Self-pay | Admitting: Internal Medicine

## 2012-12-08 ENCOUNTER — Telehealth: Payer: Self-pay | Admitting: *Deleted

## 2012-12-08 NOTE — Telephone Encounter (Signed)
Pt  late for appt this AM - arrived at 10:15AM. States has 2 boils - one in mouth and one at rectum. Offered appt 1:45PM - unable due to picking up children at school and also offered late today - unable due to children. Appt sch 12/09/12 AM. Suggest warm soaks to area. Stanton Kidney Audria Takeshita RN 12/09/12 10:30AM

## 2012-12-08 NOTE — Telephone Encounter (Signed)
Called patient for update from [facility].  He followed with surgical oncologist at [facility].  Oncologist did not think it was cancer but pt to return in 01/2013 and 02/2013   Shirlee Latch MD

## 2012-12-08 NOTE — Telephone Encounter (Signed)
Cyst in groin/anus and in his mouth.  He needs an appt to see the clinic.  Appt will be tomorrow am. Anxiety pills are helping   Shirlee Latch MD

## 2012-12-09 ENCOUNTER — Encounter: Payer: Self-pay | Admitting: Internal Medicine

## 2012-12-09 ENCOUNTER — Ambulatory Visit (INDEPENDENT_AMBULATORY_CARE_PROVIDER_SITE_OTHER): Payer: No Typology Code available for payment source | Admitting: Internal Medicine

## 2012-12-09 VITALS — BP 137/85 | HR 75 | Temp 98.8°F | Ht 68.5 in | Wt 178.5 lb

## 2012-12-09 DIAGNOSIS — L0233 Carbuncle of buttock: Secondary | ICD-10-CM

## 2012-12-09 DIAGNOSIS — K610 Anal abscess: Secondary | ICD-10-CM

## 2012-12-09 DIAGNOSIS — H539 Unspecified visual disturbance: Secondary | ICD-10-CM

## 2012-12-09 DIAGNOSIS — K056 Periodontal disease, unspecified: Secondary | ICD-10-CM

## 2012-12-09 DIAGNOSIS — R109 Unspecified abdominal pain: Secondary | ICD-10-CM | POA: Insufficient documentation

## 2012-12-09 LAB — GLUCOSE, CAPILLARY: Glucose-Capillary: 122 mg/dL — ABNORMAL HIGH (ref 70–99)

## 2012-12-09 MED ORDER — AZITHROMYCIN 250 MG PO TABS: ORAL_TABLET | ORAL | Status: DC

## 2012-12-09 MED ORDER — OXYCODONE-ACETAMINOPHEN 10-325 MG PO TABS: 1.0000 | ORAL_TABLET | Freq: Three times a day (TID) | ORAL | Status: DC | PRN

## 2012-12-09 MED ORDER — DOXYCYCLINE HYCLATE 100 MG PO CAPS: 100.0000 mg | ORAL_CAPSULE | Freq: Two times a day (BID) | ORAL | Status: DC

## 2012-12-09 NOTE — Assessment & Plan Note (Addendum)
States that he has blurriness at times which reminds him of when he became hypoglycemic many years ago. Requesting glucometer although not diabetes. -will check cbg today and defer need for glucometer and further assessment to PCP --> cbg normal  CBG (last 3)   Recent Labs  12/09/12 1059  GLUCAP 122*

## 2012-12-09 NOTE — Progress Notes (Signed)
  Subjective:    Patient ID: Jerry Terrell, male    DOB: [birth year], 52 y.o.   MRN: [id number]  HPI  Presents with complaints of boil in his mouth and rectum. His story is a bit convoluted and difficult to follow but reports prior boils which have always improved with antibiotics.  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  Denies anal or rectal sex.  States that he has not been sexually active in over 6 months.  Last negative HIV Jan 2013. States that when he was treated for bronchitis at the end of October a prior "rectal boil" had improved.  As for his mouth lesions, patient states that "boils" form on his gums and inner cheek every so often.  Denies fever, chills, dysuria, throat pain or bleeding of any kind.  States that he had blurrinessin his eyes x1 over a week or two ago and is requesting a glucometer.  Review of Systems  Constitutional: Negative.   HENT: Negative.   Eyes: Negative.   Respiratory: Negative.   Cardiovascular: Negative.   Gastrointestinal: Positive for abdominal pain. Negative for nausea, vomiting, blood in stool and anal bleeding.  Endocrine: Negative.   Genitourinary: Negative for dysuria, hematuria, discharge, scrotal swelling, genital sores, penile pain and testicular pain.  Musculoskeletal: Negative.   Skin:       boils  Allergic/Immunologic: Negative for environmental allergies, food allergies and immunocompromised state.  Neurological: Negative.   Psychiatric/Behavioral: Negative.        Objective:   Physical Exam  Constitutional: He is oriented to person, place, and time. He appears well-developed and well-nourished. No distress.  HENT:  Head: Normocephalic and atraumatic.  Mouth/Throat: Oropharynx is clear and moist. Oral lesions present. Dental caries present.    Poor dentition with multiple missing teet and caries  Scattered areas of hyperpigmentation of oral gingiva, inner  cheeks and tongue  Tender gingival area lower right mandible, no drainage, no erythema, no foul smell  Eyes: Conjunctivae and EOM are normal. Pupils are equal, round, and reactive to light.  Neck: Neck supple.  Cardiovascular: Normal rate.   Pulmonary/Chest: Effort normal.  Abdominal: Soft.  Genitourinary: Penis normal. Rectal exam shows tenderness. Rectal exam shows no external hemorrhoid and no fissure.     Musculoskeletal: Normal range of motion.  Neurological: He is alert and oriented to person, place, and time.  Skin: Skin is warm and dry.  Psychiatric: He has a normal mood and affect. His behavior is normal. Judgment and thought content normal.          Assessment & Plan:  See separate problem list charting:  #1 anal furuncle, recurrent: no cellulitis, no drainage -treat with doxycycline 100 mg bid x7 days for MRSA coverage  #2 oral lesion in setting of periodontal disease: possible small carbuncle, poor dentition, needs regular dental care, no drainage or erythema or need for incision and drainage at this time -doxycycline should cover possible infection or small abscess  #3 abdominal pain: will refill Percocet with fill date 11/24 ( a few days early given coming holiday), reinforced that PCP would discuss length of opioid therapy  #4 visual disturbance: episode x1 that lastly briefly and not currently on-going, cbg checked today and no hypoglycemia CBG (last 3)   Recent Labs  12/09/12 1059  GLUCAP 122*

## 2012-12-09 NOTE — Assessment & Plan Note (Signed)
Poor dentition with periodontal disease evident, no actively draining lesion, small area of tenderness to lower right mandible area -cover with doxycycline for anal furuncle -needs regular dental care

## 2012-12-09 NOTE — Patient Instructions (Signed)
For your boils, we will prescribe the same antibiotic which you have had in the past. We have giving you a refill on the pain medication. This may not be filled until right before Thanksgiving. Follow-up with your Primary Doctor.

## 2012-12-09 NOTE — Assessment & Plan Note (Signed)
Will treat empirically with doxycycline 100 mg bid x7 days for MRSA coverage.  Of note pt has sulf allergy thus no Bactrim.

## 2012-12-09 NOTE — Progress Notes (Signed)
Antibiotic Z-pk called in to [facility] pharmacy - pt request. Stanton Kidney Brayan Votaw RN 12/09/12 11AM

## 2012-12-12 ENCOUNTER — Other Ambulatory Visit: Payer: Self-pay | Admitting: *Deleted

## 2012-12-12 MED ORDER — AMLODIPINE BESYLATE 10 MG PO TABS: 10.0000 mg | ORAL_TABLET | Freq: Every day | ORAL | Status: DC

## 2012-12-13 NOTE — Telephone Encounter (Signed)
Rx faxed in.

## 2012-12-20 NOTE — Progress Notes (Signed)
Case discussed with Dr. Schooler at the time of the visit.  We reviewed the resident's history and exam and pertinent patient test results.  I agree with the assessment, diagnosis, and plan of care documented in the resident's note.     

## 2013-01-05 ENCOUNTER — Ambulatory Visit (INDEPENDENT_AMBULATORY_CARE_PROVIDER_SITE_OTHER): Payer: No Typology Code available for payment source | Admitting: Internal Medicine

## 2013-01-05 ENCOUNTER — Encounter: Payer: Self-pay | Admitting: Internal Medicine

## 2013-01-05 VITALS — BP 145/94 | HR 89 | Temp 98.1°F | Ht 68.0 in | Wt 174.0 lb

## 2013-01-05 DIAGNOSIS — N529 Male erectile dysfunction, unspecified: Secondary | ICD-10-CM

## 2013-01-05 DIAGNOSIS — F411 Generalized anxiety disorder: Secondary | ICD-10-CM

## 2013-01-05 DIAGNOSIS — K862 Cyst of pancreas: Secondary | ICD-10-CM

## 2013-01-05 DIAGNOSIS — F17200 Nicotine dependence, unspecified, uncomplicated: Secondary | ICD-10-CM

## 2013-01-05 DIAGNOSIS — K089 Disorder of teeth and supporting structures, unspecified: Secondary | ICD-10-CM

## 2013-01-05 DIAGNOSIS — N3946 Mixed incontinence: Secondary | ICD-10-CM | POA: Insufficient documentation

## 2013-01-05 DIAGNOSIS — I10 Essential (primary) hypertension: Secondary | ICD-10-CM

## 2013-01-05 DIAGNOSIS — K610 Anal abscess: Secondary | ICD-10-CM

## 2013-01-05 DIAGNOSIS — L0233 Carbuncle of buttock: Secondary | ICD-10-CM

## 2013-01-05 DIAGNOSIS — R109 Unspecified abdominal pain: Secondary | ICD-10-CM

## 2013-01-05 MED ORDER — SERTRALINE HCL 50 MG PO TABS: 50.0000 mg | ORAL_TABLET | Freq: Every day | ORAL | Status: DC

## 2013-01-05 MED ORDER — OXYCODONE-ACETAMINOPHEN 10-325 MG PO TABS: 1.0000 | ORAL_TABLET | ORAL | Status: DC | PRN

## 2013-01-05 MED ORDER — HYDROCHLOROTHIAZIDE 25 MG PO TABS: 25.0000 mg | ORAL_TABLET | Freq: Every day | ORAL | Status: AC

## 2013-01-05 MED ORDER — AMLODIPINE BESYLATE 10 MG PO TABS: 10.0000 mg | ORAL_TABLET | Freq: Every day | ORAL | Status: DC

## 2013-01-05 MED ORDER — ONDANSETRON HCL 4 MG PO TABS: 4.0000 mg | ORAL_TABLET | Freq: Three times a day (TID) | ORAL | Status: DC | PRN

## 2013-01-05 NOTE — Assessment & Plan Note (Addendum)
Rx refill Zoloft 50 mg qd sxs more controlled

## 2013-01-05 NOTE — Assessment & Plan Note (Signed)
Already completed Doxy course and furuncle went down Continue warm compresses  Return if inflamed again for I&D

## 2013-01-05 NOTE — Assessment & Plan Note (Signed)
Pt needs to see dentist also dentist needs to look at right cheek which could have cyst or abscess  Informed RN who will try to set up dental appt

## 2013-01-05 NOTE — Assessment & Plan Note (Signed)
BP Readings from Last 3 Encounters:  01/05/13 145/94  12/09/12 137/85  11/17/12 153/98    Lab Results  Component Value Date   NA 139 11/17/2012   K 4.7 11/17/2012   CREATININE 1.14 11/17/2012    Assessment: Blood pressure control: mildly elevated Progress toward BP goal:  unchanged Comments: none  Plan: Medications:  continue current medications; Rx refill 1 year supply Norvasc 10 and HCTZ 25 mg  Educational resources provided: other (see comments) Self management tools provided: other (see comments) Other plans: none

## 2013-01-05 NOTE — Assessment & Plan Note (Addendum)
Ongoing since 2008.  seen by [facility] surgical oncology 11/2012 and previously followed by GI.  08/2011 and 11/2011 CT abdomen/pelvis, a 1.3 x 1.8 cystic mass in head of pancreas.  EUS on 04/11/12 which showed a 11.6 X 15 mm cyst with septations in the head of the pancreas, differential diagnoses included side branch IPMN and serous cystadenoma. A FNA showed scant cellularity and was nondiagnostic. Surgical oncology decided NOT to proceed with surgery at this time and also they think sx's i.e abdominal pain unlikely due all to pancreatic cyst and may be related to pancreatitis.  Plan is to follow-up and repeat MRI abdomen/pelvis to re evaluate pancreatic cyst, if it increases in size may re-consider surgery.  He was also referred to pain clinic to address his chronic abdominal pain.   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

## 2013-01-05 NOTE — Progress Notes (Signed)
Case discussed with Dr. McLean at the time of the visit.  We reviewed the resident's history and exam and pertinent patient test results.  I agree with the assessment, diagnosis, and plan of care documented in the resident's note.     

## 2013-01-05 NOTE — Assessment & Plan Note (Signed)
  Assessment: Progress toward smoking cessation:  smoking less Barriers to progress toward smoking cessation:  stress Comments: none  Plan: Instruction/counseling given:  I counseled patient on the dangers of tobacco use, advised patient to stop smoking, and reviewed strategies to maximize success. Educational resources provided:  [employer]) brochure Self management tools provided:  other (see comments) Medications to assist with smoking cessation:  None; inquired about gum but likely unable to afford  Patient agreed to the following self-care plans for smoking cessation: call [employer] (1-800-QUIT-NOW)  Other plans: keep counseling smoking cessation

## 2013-01-05 NOTE — Patient Instructions (Addendum)
General Instructions: Please follow up with the pain clinic, surgical clinic as scheduled  Please follow up with urology as scheduled  Return to clinic in 4-6 months, sooner if needed  Read all the information below   Treatment Goals:  Goals (1 Years of Data) as of 01/05/13         As of Today 12/09/12 11/17/12 11/07/12 11/07/12     Blood Pressure    . Blood Pressure < 140/90  145/94 137/85 153/98 127/71 133/67      Progress Toward Treatment Goals:  Treatment Goal 01/05/2013  Blood pressure unchanged  Stop smoking smoking less    Self Care Goals & Plans:  Self Care Goal 01/05/2013  Manage my medications take my medicines as prescribed; bring my medications to every visit; refill my medications on time  Monitor my health keep track of my blood pressure  Eat healthy foods drink diet soda or water instead of juice or soda; eat more vegetables; eat foods that are low in salt; eat baked foods instead of fried foods; eat fruit for snacks and desserts; eat smaller portions  Be physically active find an activity I enjoy  Stop smoking call [facility] (1-800-QUIT-NOW)  Meeting treatment goals maintain the current self-care plan    No flowsheet data found.   Care Management & Community Referrals:  Referral 01/05/2013  Referrals made for care management support none needed  Referrals made to community resources none       Abdominal Pain Many things can cause belly (abdominal) pain. Most times, the belly pain is not dangerous. The amount of belly pain does not tell how serious the problem may be. Many cases of belly pain can be watched and treated at home. HOME CARE   Do not take medicines that help you go poop (laxatives) unless told to by your doctor.  Only take medicine as told by your doctor.  Eat or drink as told by your doctor. Your doctor will tell you if you should be on a special diet. GET HELP RIGHT AWAY IF:   The pain does not go away.  You have a  fever.  You keep throwing up (vomiting).  The pain changes and is only in the right or left part of the belly.  You have bloody or tarry looking poop. MAKE SURE YOU:   Understand these instructions.  Will watch your condition.  Will get help right away if you are not doing well or get worse. Document Released: 06/24/2007 Document Revised: 03/30/2011 Document Reviewed: 01/21/2009 [facility] Patient Information 2014 [place], [place].  DASH Diet The DASH diet stands for "Dietary Approaches to Stop Hypertension." It is a healthy eating plan that has been shown to reduce high blood pressure (hypertension) in as little as 14 days, while also possibly providing other significant health benefits. These other health benefits include reducing the risk of breast cancer after menopause and reducing the risk of type 2 diabetes, heart disease, colon cancer, and stroke. Health benefits also include weight loss and slowing kidney failure in patients with chronic kidney disease.  DIET GUIDELINES  Limit salt (sodium). Your diet should contain less than 1500 mg of sodium daily.  Limit refined or processed carbohydrates. Your diet should include mostly whole grains. Desserts and added sugars should be used sparingly.  Include small amounts of heart-healthy fats. These types of fats include nuts, oils, and tub margarine. Limit saturated and trans fats. These fats have been shown to be harmful in the body. CHOOSING FOODS  The following food groups are based on a 2000 calorie diet. See your Registered Dietitian for individual calorie needs. Grains and Grain Products (6 to 8 servings daily)  Eat More Often: Whole-wheat bread, brown rice, whole-grain or wheat pasta, quinoa, popcorn without added fat or salt (air popped).  Eat Less Often: White bread, white pasta, white rice, cornbread. Vegetables (4 to 5 servings daily)  Eat More Often: Fresh, frozen, and canned vegetables. Vegetables may be raw, steamed,  roasted, or grilled with a minimal amount of fat.  Eat Less Often/Avoid: Creamed or fried vegetables. Vegetables in a cheese sauce. Fruit (4 to 5 servings daily)  Eat More Often: All fresh, canned (in natural juice), or frozen fruits. Dried fruits without added sugar. One hundred percent fruit juice ( cup [237 mL] daily).  Eat Less Often: Dried fruits with added sugar. Canned fruit in light or heavy syrup. [employer], Fish, and Poultry (2 servings or less daily. One serving is 3 to 4 oz [85-114 g]).  Eat More Often: Ninety percent or leaner ground beef, tenderloin, sirloin. Round cuts of beef, chicken breast, [place] breast. All fish. Grill, bake, or broil your meat. Nothing should be fried.  Eat Less Often/Avoid: Fatty cuts of meat, [place], or chicken leg, thigh, or wing. Fried cuts of meat or fish. Dairy (2 to 3 servings)  Eat More Often: Low-fat or fat-free milk, low-fat plain or light yogurt, reduced-fat or part-skim cheese.  Eat Less Often/Avoid: Milk (whole, 2%).Whole milk yogurt. Full-fat cheeses. Nuts, Seeds, and Legumes (4 to 5 servings per week)  Eat More Often: All without added salt.  Eat Less Often/Avoid: Salted nuts and seeds, canned beans with added salt. Fats and Sweets (limited)  Eat More Often: Vegetable oils, tub margarines without trans fats, sugar-free gelatin. Mayonnaise and salad dressings.  Eat Less Often/Avoid: Coconut oils, palm oils, butter, stick margarine, cream, half and half, cookies, candy, pie. FOR MORE INFORMATION The Dash Diet Eating Plan: www.dashdiet.org Document Released: 12/25/2010 Document Revised: 03/30/2011 Document Reviewed: 12/25/2010 [facility] Patient Information 2014 [place], [place].  Hypertension As your heart beats, it forces blood through your arteries. This force is your blood pressure. If the pressure is too high, it is called hypertension (HTN) or high blood pressure. HTN is dangerous because you may have it and not know it. High  blood pressure may mean that your heart has to work harder to pump blood. Your arteries may be narrow or stiff. The extra work puts you at risk for heart disease, stroke, and other problems.  Blood pressure consists of two numbers, a higher number over a lower, 110/72, for example. It is stated as "110 over 72." The ideal is below 120 for the top number (systolic) and under 80 for the bottom (diastolic). Write down your blood pressure today. You should pay close attention to your blood pressure if you have certain conditions such as:  Heart failure.  Prior heart attack.  Diabetes  Chronic kidney disease.  Prior stroke.  Multiple risk factors for heart disease. To see if you have HTN, your blood pressure should be measured while you are seated with your arm held at the level of the heart. It should be measured at least twice. A one-time elevated blood pressure reading (especially in the Emergency Department) does not mean that you need treatment. There may be conditions in which the blood pressure is different between your right and left arms. It is important to see your caregiver soon for a recheck. Most people  have essential hypertension which means that there is not a specific cause. This type of high blood pressure may be lowered by changing lifestyle factors such as:  Stress.  Smoking.  Lack of exercise.  Excessive weight.  Drug/tobacco/alcohol use.  Eating less salt. Most people do not have symptoms from high blood pressure until it has caused damage to the body. Effective treatment can often prevent, delay or reduce that damage. TREATMENT  When a cause has been identified, treatment for high blood pressure is directed at the cause. There are a large number of medications to treat HTN. These fall into several categories, and your caregiver will help you select the medicines that are best for you. Medications may have side effects. You should review side effects with your  caregiver. If your blood pressure stays high after you have made lifestyle changes or started on medicines,   Your medication(s) may need to be changed.  Other problems may need to be addressed.  Be certain you understand your prescriptions, and know how and when to take your medicine.  Be sure to follow up with your caregiver within the time frame advised (usually within two weeks) to have your blood pressure rechecked and to review your medications.  If you are taking more than one medicine to lower your blood pressure, make sure you know how and at what times they should be taken. Taking two medicines at the same time can result in blood pressure that is too low. SEEK IMMEDIATE MEDICAL CARE IF:  You develop a severe headache, blurred or changing vision, or confusion.  You have unusual weakness or numbness, or a faint feeling.  You have severe chest or abdominal pain, vomiting, or breathing problems. MAKE SURE YOU:   Understand these instructions.  Will watch your condition.  Will get help right away if you are not doing well or get worse. Document Released: 01/05/2005 Document Revised: 03/30/2011 Document Reviewed: 08/26/2007 [facility] Patient Information 2014 [place], [place].  Abscess An abscess is an infected area that contains a collection of pus and debris.It can occur in almost any part of the body. An abscess is also known as a furuncle or boil. CAUSES  An abscess occurs when tissue gets infected. This can occur from blockage of oil or sweat glands, infection of hair follicles, or a minor injury to the skin. As the body tries to fight the infection, pus collects in the area and creates pressure under the skin. This pressure causes pain. People with weakened immune systems have difficulty fighting infections and get certain abscesses more often.  SYMPTOMS Usually an abscess develops on the skin and becomes a painful mass that is red, warm, and tender. If the abscess forms  under the skin, you may feel a moveable soft area under the skin. Some abscesses break open (rupture) on their own, but most will continue to get worse without care. The infection can spread deeper into the body and eventually into the bloodstream, causing you to feel ill.  DIAGNOSIS  Your caregiver will take your medical history and perform a physical exam. A sample of fluid may also be taken from the abscess to determine what is causing your infection. TREATMENT  Your caregiver may prescribe antibiotic medicines to fight the infection. However, taking antibiotics alone usually does not cure an abscess. Your caregiver may need to make a small cut (incision) in the abscess to drain the pus. In some cases, gauze is packed into the abscess to reduce pain and to  continue draining the area. HOME CARE INSTRUCTIONS   Only take over-the-counter or prescription medicines for pain, discomfort, or fever as directed by your caregiver.  If you were prescribed antibiotics, take them as directed. Finish them even if you start to feel better.  If gauze is used, follow your caregiver's directions for changing the gauze.  To avoid spreading the infection:  Keep your draining abscess covered with a bandage.  Wash your hands well.  Do not share personal care items, towels, or whirlpools with others.  Avoid skin contact with others.  Keep your skin and clothes clean around the abscess.  Keep all follow-up appointments as directed by your caregiver. SEEK MEDICAL CARE IF:   You have increased pain, swelling, redness, fluid drainage, or bleeding.  You have muscle aches, chills, or a general ill feeling.  You have a fever. MAKE SURE YOU:   Understand these instructions.  Will watch your condition.  Will get help right away if you are not doing well or get worse. Document Released: 10/15/2004 Document Revised: 07/07/2011 Document Reviewed: 03/20/2011 [facility] Patient Information 2014 [place],  [place].  Smoking Cessation Quitting smoking is important to your health and has many advantages. However, it is not always easy to quit since nicotine is a very addictive drug. Often times, people try 3 times or more before being able to quit. This document explains the best ways for you to prepare to quit smoking. Quitting takes hard work and a lot of effort, but you can do it. ADVANTAGES OF QUITTING SMOKING  You will live longer, feel better, and live better.  Your body will feel the impact of quitting smoking almost immediately.  Within 20 minutes, blood pressure decreases. Your pulse returns to its normal level.  After 8 hours, carbon monoxide levels in the blood return to normal. Your oxygen level increases.  After 24 hours, the chance of having a heart attack starts to decrease. Your breath, hair, and body stop smelling like smoke.  After 48 hours, damaged nerve endings begin to recover. Your sense of taste and smell improve.  After 72 hours, the body is virtually free of nicotine. Your bronchial tubes relax and breathing becomes easier.  After 2 to 12 weeks, lungs can hold more air. Exercise becomes easier and circulation improves.  The risk of having a heart attack, stroke, cancer, or lung disease is greatly reduced.  After 1 year, the risk of coronary heart disease is cut in half.  After 5 years, the risk of stroke falls to the same as a nonsmoker.  After 10 years, the risk of lung cancer is cut in half and the risk of other cancers decreases significantly.  After 15 years, the risk of coronary heart disease drops, usually to the level of a nonsmoker.  If you are pregnant, quitting smoking will improve your chances of having a healthy baby.  The people you live with, especially any children, will be healthier.  You will have extra money to spend on things other than cigarettes. QUESTIONS TO THINK ABOUT BEFORE ATTEMPTING TO QUIT You may want to talk about your answers with  your caregiver.  Why do you want to quit?  If you tried to quit in the past, what helped and what did not?  What will be the most difficult situations for you after you quit? How will you plan to handle them?  Who can help you through the tough times? Your family? Friends? A caregiver?  What pleasures do you get  from smoking? What ways can you still get pleasure if you quit? Here are some questions to ask your caregiver:  How can you help me to be successful at quitting?  What medicine do you think would be best for me and how should I take it?  What should I do if I need more help?  What is smoking withdrawal like? How can I get information on withdrawal? GET READY  Set a quit date.  Change your environment by getting rid of all cigarettes, ashtrays, matches, and lighters in your home, car, or work. Do not let people smoke in your home.  Review your past attempts to quit. Think about what worked and what did not. GET SUPPORT AND ENCOURAGEMENT You have a better chance of being successful if you have help. You can get support in many ways.  Tell your family, friends, and co-workers that you are going to quit and need their support. Ask them not to smoke around you.  Get individual, group, or telephone counseling and support. Programs are available at [employer] and health centers. Call your local health department for information about programs in your area.  Spiritual beliefs and practices may help some smokers quit.  Download a "quit meter" on your computer to keep track of quit statistics, such as how long you have gone without smoking, cigarettes not smoked, and money saved.  Get a self-help book about quitting smoking and staying off of tobacco. LEARN NEW SKILLS AND BEHAVIORS  Distract yourself from urges to smoke. Talk to someone, go for a walk, or occupy your time with a task.  Change your normal routine. Take a different route to work. Drink tea instead of  coffee. Eat breakfast in a different place.  Reduce your stress. Take a hot bath, exercise, or read a book.  Plan something enjoyable to do every day. Reward yourself for not smoking.  Explore interactive web-based programs that specialize in helping you quit. GET MEDICINE AND USE IT CORRECTLY Medicines can help you stop smoking and decrease the urge to smoke. Combining medicine with the above behavioral methods and support can greatly increase your chances of successfully quitting smoking.  Nicotine replacement therapy helps deliver nicotine to your body without the negative effects and risks of smoking. Nicotine replacement therapy includes nicotine gum, lozenges, inhalers, nasal sprays, and skin patches. Some may be available over-the-counter and others require a prescription.  Antidepressant medicine helps people abstain from smoking, but how this works is unknown. This medicine is available by prescription.  Nicotinic receptor partial agonist medicine simulates the effect of nicotine in your brain. This medicine is available by prescription. Ask your caregiver for advice about which medicines to use and how to use them based on your health history. Your caregiver will tell you what side effects to look out for if you choose to be on a medicine or therapy. Carefully read the information on the package. Do not use any other product containing nicotine while using a nicotine replacement product.  RELAPSE OR DIFFICULT SITUATIONS Most relapses occur within the first 3 months after quitting. Do not be discouraged if you start smoking again. Remember, most people try several times before finally quitting. You may have symptoms of withdrawal because your body is used to nicotine. You may crave cigarettes, be irritable, feel very hungry, cough often, get headaches, or have difficulty concentrating. The withdrawal symptoms are only temporary. They are strongest when you first quit, but they will go away  within 10  14 days. To reduce the chances of relapse, try to:  Avoid drinking alcohol. Drinking lowers your chances of successfully quitting.  Reduce the amount of caffeine you consume. Once you quit smoking, the amount of caffeine in your body increases and can give you symptoms, such as a rapid heartbeat, sweating, and anxiety.  Avoid smokers because they can make you want to smoke.  Do not let weight gain distract you. Many smokers will gain weight when they quit, usually less than 10 pounds. Eat a healthy diet and stay active. You can always lose the weight gained after you quit.  Find ways to improve your mood other than smoking. FOR MORE INFORMATION  www.smokefree.gov  Document Released: 12/30/2000 Document Revised: 07/07/2011 Document Reviewed: 04/16/2011 [facility] Patient Information 2014 [place], [place].

## 2013-01-05 NOTE — Progress Notes (Signed)
Subjective:    Patient ID: Jerry Terrell, male    DOB: [birth year], 52 y.o.   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    1)Pancreatic cyst-ongoing since 2008.  seen by [facility] surgical oncology 11/2012 and previously followed by GI.  08/2011 and 11/2011 CT abdomen/pelvis, a 1.3 x 1.8 cystic mass in head of pancreas.  EUS on 04/11/12 which showed a 11.6 X 15 mm cyst with septations in the head of the pancreas, differential diagnoses included side branch IPMN and serous cystadenoma. A FNA showed scant cellularity and was nondiagnostic. Surgical oncology decided NOT to proceed with surgery at this time and also they think sx's i.e abdominal pain unlikely due all to pancreatic cyst and may be related to pancreatitis.  Plan is to follow-up and repeat MRI abdomen/pelvis to re evaluate pancreatic cyst, if it increases in size may re-consider surgery.  He was also referred to pain clinic to address his chronic abdominal pain.   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   2) Abdominal pain still in left abdomen radiates to general ab and right back. Pain is 7.5-8/10.  Relived with Percocet associated with nausea relieved with Zofran.  Needs Rx refill of Percocet q4 x 1 month supply until he can see pain clinic and also needs Rx refill of Zofran tab.  Trying to eat bland foods as well which is able to tolerate   3)Needs Rx refill of Norvasc and HCTZ requesting 1  year supply so he can get if covered and for free.    4)Needs Rx refill of Zoloft 50 mg helping decrease anxiety  5) Boils to scrotum still present and painful when sitting.  Has tried Doxy and warm compresses.  C/o boil in right cheek of mouth still present    WU:[id number] for disability. Smoking 2 cig/day.  Running out of orange card at the end of this month.         Review of Systems  Respiratory: Negative for shortness of breath.   Cardiovascular: Negative for chest pain.  Gastrointestinal: Positive for abdominal pain.  Skin:       +boils        Objective:   Physical Exam  Nursing note and vitals reviewed. Constitutional: He is oriented to person, place, and time. He appears well-developed and well-nourished. He is cooperative.  HENT:  Head: Normocephalic and atraumatic.  Mouth/Throat: Oropharynx is clear and moist and mucous membranes are normal. Abnormal dentition. No oropharyngeal exudate.  Right cheek with blue hue and possible abscess or cyst   Eyes: Conjunctivae are normal. Pupils are equal, round, and reactive to light. Right eye exhibits no discharge. Left eye exhibits no discharge. No scleral icterus.  Cardiovascular: Normal rate, regular rhythm, S1 normal, S2 normal and normal heart sounds.   No murmur heard. No lower ext edema   Pulmonary/Chest: Effort normal and breath sounds normal.  Abdominal: Soft. Bowel sounds are normal. He exhibits no distension. There is no tenderness.  ttp mod LLQ, LUQ, RUQ  Genitourinary:     Musculoskeletal: He exhibits no edema.  Neurological: He is alert and oriented to person, place, and time. Gait normal.  Skin: Skin is warm and dry.  See GU  Psychiatric: He has a normal mood and affect. His speech is normal and behavior is normal. Judgment and thought content normal. Cognition and memory are normal.          Assessment & Plan:  F/u in 4-6 months, sooner if needed

## 2013-01-16 ENCOUNTER — Telehealth: Payer: Self-pay | Admitting: Dietician

## 2013-01-16 ENCOUNTER — Ambulatory Visit: Payer: No Typology Code available for payment source

## 2013-01-16 NOTE — Telephone Encounter (Signed)
Patient was here seeing Jerry Terrell to renew his orange card and requested meter to check blood sugars. He said a doctor asked him to check it because of his pancreas and prostate. No order for meter noted in his chart. If patient needs meter, please send order to [facility] for strips- ( 6$ for 50) and free meter.

## 2013-01-25 ENCOUNTER — Emergency Department (HOSPITAL_COMMUNITY)
Admission: EM | Admit: 2013-01-25 | Discharge: 2013-01-25 | Disposition: A | Discharge status: Home / Self Care | Payer: No Typology Code available for payment source | Attending: Emergency Medicine | Admitting: Emergency Medicine

## 2013-01-25 ENCOUNTER — Encounter (HOSPITAL_COMMUNITY): Payer: Self-pay | Admitting: Emergency Medicine

## 2013-01-25 ENCOUNTER — Emergency Department (HOSPITAL_COMMUNITY): Payer: No Typology Code available for payment source

## 2013-01-25 DIAGNOSIS — N189 Chronic kidney disease, unspecified: Secondary | ICD-10-CM | POA: Insufficient documentation

## 2013-01-25 DIAGNOSIS — I129 Hypertensive chronic kidney disease with stage 1 through stage 4 chronic kidney disease, or unspecified chronic kidney disease: Secondary | ICD-10-CM | POA: Insufficient documentation

## 2013-01-25 DIAGNOSIS — R109 Unspecified abdominal pain: Secondary | ICD-10-CM

## 2013-01-25 DIAGNOSIS — IMO0001 Reserved for inherently not codable concepts without codable children: Secondary | ICD-10-CM | POA: Insufficient documentation

## 2013-01-25 DIAGNOSIS — J45909 Unspecified asthma, uncomplicated: Secondary | ICD-10-CM | POA: Insufficient documentation

## 2013-01-25 DIAGNOSIS — R5381 Other malaise: Secondary | ICD-10-CM | POA: Insufficient documentation

## 2013-01-25 DIAGNOSIS — Z95818 Presence of other cardiac implants and grafts: Secondary | ICD-10-CM | POA: Insufficient documentation

## 2013-01-25 DIAGNOSIS — Z79899 Other long term (current) drug therapy: Secondary | ICD-10-CM | POA: Insufficient documentation

## 2013-01-25 DIAGNOSIS — Z9889 Other specified postprocedural states: Secondary | ICD-10-CM | POA: Insufficient documentation

## 2013-01-25 DIAGNOSIS — Z87448 Personal history of other diseases of urinary system: Secondary | ICD-10-CM | POA: Insufficient documentation

## 2013-01-25 DIAGNOSIS — F17200 Nicotine dependence, unspecified, uncomplicated: Secondary | ICD-10-CM | POA: Insufficient documentation

## 2013-01-25 DIAGNOSIS — R1084 Generalized abdominal pain: Secondary | ICD-10-CM | POA: Insufficient documentation

## 2013-01-25 DIAGNOSIS — K219 Gastro-esophageal reflux disease without esophagitis: Secondary | ICD-10-CM | POA: Insufficient documentation

## 2013-01-25 DIAGNOSIS — G8929 Other chronic pain: Secondary | ICD-10-CM | POA: Insufficient documentation

## 2013-01-25 DIAGNOSIS — Z8619 Personal history of other infectious and parasitic diseases: Secondary | ICD-10-CM | POA: Insufficient documentation

## 2013-01-25 DIAGNOSIS — K862 Cyst of pancreas: Secondary | ICD-10-CM | POA: Insufficient documentation

## 2013-01-25 DIAGNOSIS — R52 Pain, unspecified: Secondary | ICD-10-CM | POA: Insufficient documentation

## 2013-01-25 DIAGNOSIS — K5900 Constipation, unspecified: Secondary | ICD-10-CM | POA: Insufficient documentation

## 2013-01-25 DIAGNOSIS — R509 Fever, unspecified: Secondary | ICD-10-CM | POA: Insufficient documentation

## 2013-01-25 DIAGNOSIS — K863 Pseudocyst of pancreas: Secondary | ICD-10-CM

## 2013-01-25 DIAGNOSIS — R112 Nausea with vomiting, unspecified: Secondary | ICD-10-CM | POA: Insufficient documentation

## 2013-01-25 DIAGNOSIS — R5383 Other fatigue: Secondary | ICD-10-CM

## 2013-01-25 LAB — CG4 I-STAT (LACTIC ACID): Lactic Acid, Venous: 0.77 mmol/L (ref 0.5–2.2)

## 2013-01-25 LAB — URINALYSIS, ROUTINE W REFLEX MICROSCOPIC
BILIRUBIN URINE: NEGATIVE
Glucose, UA: NEGATIVE mg/dL
KETONES UR: 15 mg/dL — AB
LEUKOCYTES UA: NEGATIVE
NITRITE: NEGATIVE
PH: 5 (ref 5.0–8.0)
PROTEIN: 30 mg/dL — AB
Specific Gravity, Urine: 1.023 (ref 1.005–1.030)
UROBILINOGEN UA: 1 mg/dL (ref 0.0–1.0)

## 2013-01-25 LAB — COMPREHENSIVE METABOLIC PANEL
ALK PHOS: 126 U/L — AB (ref 39–117)
ALT: 43 U/L (ref 0–53)
AST: 31 U/L (ref 0–37)
Albumin: 4.1 g/dL (ref 3.5–5.2)
BILIRUBIN TOTAL: 1.5 mg/dL — AB (ref 0.3–1.2)
BUN: 12 mg/dL (ref 6–23)
CHLORIDE: 96 meq/L (ref 96–112)
CO2: 25 mEq/L (ref 19–32)
CREATININE: 1.08 mg/dL (ref 0.50–1.35)
Calcium: 9.9 mg/dL (ref 8.4–10.5)
GFR calc non Af Amer: 76 mL/min — ABNORMAL LOW (ref >90)
GFR, EST AFRICAN AMERICAN: 88 mL/min — AB (ref >90)
GLUCOSE: 126 mg/dL — AB (ref 70–99)
POTASSIUM: 3.9 meq/L (ref 3.7–5.3)
Sodium: 137 mEq/L (ref 137–147)
Total Protein: 8.1 g/dL (ref 6.0–8.3)

## 2013-01-25 LAB — CBC WITH DIFFERENTIAL/PLATELET
BASOS ABS: 0.1 10*3/uL (ref 0.0–0.1)
Basophils Relative: 1 % (ref 0–1)
EOS PCT: 3 % (ref 0–5)
Eosinophils Absolute: 0.2 10*3/uL (ref 0.0–0.7)
HEMATOCRIT: 50.8 % (ref 39.0–52.0)
Hemoglobin: 18.4 g/dL — ABNORMAL HIGH (ref 13.0–17.0)
LYMPHS ABS: 3.7 10*3/uL (ref 0.7–4.0)
Lymphocytes Relative: 54 % — ABNORMAL HIGH (ref 12–46)
MCH: 32.2 pg (ref 26.0–34.0)
MCHC: 36.2 g/dL — ABNORMAL HIGH (ref 30.0–36.0)
MCV: 89 fL (ref 78.0–100.0)
Monocytes Absolute: 0.6 10*3/uL (ref 0.1–1.0)
Monocytes Relative: 9 % (ref 3–12)
NEUTROS ABS: 2.2 10*3/uL (ref 1.7–7.7)
Neutrophils Relative %: 33 % — ABNORMAL LOW (ref 43–77)
PLATELETS: 252 10*3/uL (ref 150–400)
RBC: 5.71 MIL/uL (ref 4.22–5.81)
RDW: 12.7 % (ref 11.5–15.5)
Smear Review: ADEQUATE
WBC: 6.8 10*3/uL (ref 4.0–10.5)

## 2013-01-25 LAB — URINE MICROSCOPIC-ADD ON

## 2013-01-25 LAB — PATHOLOGIST SMEAR REVIEW

## 2013-01-25 LAB — OCCULT BLOOD, POC DEVICE: Fecal Occult Bld: NEGATIVE

## 2013-01-25 LAB — LIPASE, BLOOD: LIPASE: 48 U/L (ref 11–59)

## 2013-01-25 MED ORDER — IOHEXOL 300 MG/ML  SOLN
80.0000 mL | Freq: Once | INTRAMUSCULAR | Status: AC | PRN
  Administered 2013-01-25: 80 mL via INTRAVENOUS

## 2013-01-25 MED ORDER — OXYCODONE-ACETAMINOPHEN 5-325 MG PO TABS
2.0000 | ORAL_TABLET | Freq: Once | ORAL | Status: AC
  Administered 2013-01-25: 2 via ORAL
  Filled 2013-01-25: qty 2

## 2013-01-25 MED ORDER — ONDANSETRON HCL 4 MG/2ML IJ SOLN
4.0000 mg | Freq: Once | INTRAMUSCULAR | Status: AC
  Administered 2013-01-25: 4 mg via INTRAVENOUS
  Filled 2013-01-25: qty 2

## 2013-01-25 MED ORDER — HYDROMORPHONE HCL PF 1 MG/ML IJ SOLN
1.0000 mg | Freq: Once | INTRAMUSCULAR | Status: AC
Start: 2013-01-25 — End: 2013-01-25
  Administered 2013-01-25: 1 mg via INTRAVENOUS
  Filled 2013-01-25: qty 1
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
## 2013-01-25 NOTE — Discharge Instructions (Signed)

## 2013-01-25 NOTE — ED Provider Notes (Signed)
Care assumed from Dr. Wyvonnia Dusky.  CT unremarkable.  Labs stable.  Plan DC with outpatient follow up.     Clinical Impression: 1. Abdominal pain       Houston Siren, MD 01/25/13 [phone]

## 2013-01-25 NOTE — ED Notes (Signed)
Pt has an appt with Cancer doctor on 02/07/13 and has first appt with [facility] in [place] on 1/27-- states will be out of pain med prior to seeing the Cancer dr.

## 2013-01-25 NOTE — ED Notes (Signed)
Pt states that he has been having abdominal pain for the past couple of days (pt has hx of pancarditis.) pt also concerned with flu like symptoms.

## 2013-01-27 ENCOUNTER — Emergency Department (HOSPITAL_COMMUNITY)
Admission: EM | Admit: 2013-01-27 | Discharge: 2013-01-27 | Disposition: A | Discharge status: Home / Self Care | Payer: No Typology Code available for payment source | Attending: Emergency Medicine | Admitting: Emergency Medicine

## 2013-01-27 ENCOUNTER — Encounter (HOSPITAL_COMMUNITY): Payer: Self-pay | Admitting: Emergency Medicine

## 2013-01-27 DIAGNOSIS — H5713 Ocular pain, bilateral: Secondary | ICD-10-CM

## 2013-01-27 DIAGNOSIS — Z862 Personal history of diseases of the blood and blood-forming organs and certain disorders involving the immune mechanism: Secondary | ICD-10-CM | POA: Insufficient documentation

## 2013-01-27 DIAGNOSIS — T1590XA Foreign body on external eye, part unspecified, unspecified eye, initial encounter: Secondary | ICD-10-CM | POA: Insufficient documentation

## 2013-01-27 DIAGNOSIS — Y92410 Unspecified street and highway as the place of occurrence of the external cause: Secondary | ICD-10-CM | POA: Insufficient documentation

## 2013-01-27 DIAGNOSIS — J45909 Unspecified asthma, uncomplicated: Secondary | ICD-10-CM | POA: Insufficient documentation

## 2013-01-27 DIAGNOSIS — I209 Angina pectoris, unspecified: Secondary | ICD-10-CM | POA: Insufficient documentation

## 2013-01-27 DIAGNOSIS — R51 Headache: Secondary | ICD-10-CM | POA: Insufficient documentation

## 2013-01-27 DIAGNOSIS — G8929 Other chronic pain: Secondary | ICD-10-CM | POA: Insufficient documentation

## 2013-01-27 DIAGNOSIS — Z8711 Personal history of peptic ulcer disease: Secondary | ICD-10-CM | POA: Insufficient documentation

## 2013-01-27 DIAGNOSIS — H5710 Ocular pain, unspecified eye: Secondary | ICD-10-CM | POA: Insufficient documentation

## 2013-01-27 DIAGNOSIS — Y9389 Activity, other specified: Secondary | ICD-10-CM | POA: Insufficient documentation

## 2013-01-27 DIAGNOSIS — N189 Chronic kidney disease, unspecified: Secondary | ICD-10-CM | POA: Insufficient documentation

## 2013-01-27 DIAGNOSIS — G43909 Migraine, unspecified, not intractable, without status migrainosus: Secondary | ICD-10-CM | POA: Insufficient documentation

## 2013-01-27 DIAGNOSIS — Z79899 Other long term (current) drug therapy: Secondary | ICD-10-CM | POA: Insufficient documentation

## 2013-01-27 DIAGNOSIS — Z8639 Personal history of other endocrine, nutritional and metabolic disease: Secondary | ICD-10-CM | POA: Insufficient documentation

## 2013-01-27 DIAGNOSIS — Z95818 Presence of other cardiac implants and grafts: Secondary | ICD-10-CM | POA: Insufficient documentation

## 2013-01-27 DIAGNOSIS — Z87448 Personal history of other diseases of urinary system: Secondary | ICD-10-CM | POA: Insufficient documentation

## 2013-01-27 DIAGNOSIS — K219 Gastro-esophageal reflux disease without esophagitis: Secondary | ICD-10-CM | POA: Insufficient documentation

## 2013-01-27 DIAGNOSIS — Z8739 Personal history of other diseases of the musculoskeletal system and connective tissue: Secondary | ICD-10-CM | POA: Insufficient documentation

## 2013-01-27 DIAGNOSIS — F17200 Nicotine dependence, unspecified, uncomplicated: Secondary | ICD-10-CM | POA: Insufficient documentation

## 2013-01-27 DIAGNOSIS — Z8619 Personal history of other infectious and parasitic diseases: Secondary | ICD-10-CM | POA: Insufficient documentation

## 2013-01-27 DIAGNOSIS — I129 Hypertensive chronic kidney disease with stage 1 through stage 4 chronic kidney disease, or unspecified chronic kidney disease: Secondary | ICD-10-CM | POA: Insufficient documentation

## 2013-01-27 DIAGNOSIS — R1012 Left upper quadrant pain: Secondary | ICD-10-CM | POA: Insufficient documentation

## 2013-01-27 DIAGNOSIS — Y929 Unspecified place or not applicable: Secondary | ICD-10-CM | POA: Insufficient documentation

## 2013-01-27 LAB — CBC WITH DIFFERENTIAL/PLATELET
BASOS ABS: 0 10*3/uL (ref 0.0–0.1)
Basophils Relative: 1 % (ref 0–1)
Eosinophils Absolute: 0.1 10*3/uL (ref 0.0–0.7)
Eosinophils Relative: 2 % (ref 0–5)
HEMATOCRIT: 49.3 % (ref 39.0–52.0)
HEMOGLOBIN: 18 g/dL — AB (ref 13.0–17.0)
Lymphocytes Relative: 51 % — ABNORMAL HIGH (ref 12–46)
Lymphs Abs: 3.1 10*3/uL (ref 0.7–4.0)
MCH: 32.2 pg (ref 26.0–34.0)
MCHC: 36.5 g/dL — ABNORMAL HIGH (ref 30.0–36.0)
MCV: 88.2 fL (ref 78.0–100.0)
MONO ABS: 0.6 10*3/uL (ref 0.1–1.0)
MONOS PCT: 10 % (ref 3–12)
NEUTROS ABS: 2.3 10*3/uL (ref 1.7–7.7)
Neutrophils Relative %: 37 % — ABNORMAL LOW (ref 43–77)
Platelets: 229 10*3/uL (ref 150–400)
RBC: 5.59 MIL/uL (ref 4.22–5.81)
RDW: 12.4 % (ref 11.5–15.5)
WBC: 6.2 10*3/uL (ref 4.0–10.5)

## 2013-01-27 LAB — COMPREHENSIVE METABOLIC PANEL
ALT: 36 U/L (ref 0–53)
AST: 28 U/L (ref 0–37)
Albumin: 4.2 g/dL (ref 3.5–5.2)
Alkaline Phosphatase: 114 U/L (ref 39–117)
BUN: 10 mg/dL (ref 6–23)
CHLORIDE: 95 meq/L — AB (ref 96–112)
CO2: 32 meq/L (ref 19–32)
CREATININE: 1.09 mg/dL (ref 0.50–1.35)
Calcium: 9.9 mg/dL (ref 8.4–10.5)
GFR calc Af Amer: 87 mL/min — ABNORMAL LOW (ref >90)
GFR, EST NON AFRICAN AMERICAN: 75 mL/min — AB (ref >90)
Glucose, Bld: 100 mg/dL — ABNORMAL HIGH (ref 70–99)
Potassium: 3.5 mEq/L — ABNORMAL LOW (ref 3.7–5.3)
Sodium: 137 mEq/L (ref 137–147)
Total Bilirubin: 1.2 mg/dL (ref 0.3–1.2)
Total Protein: 8.2 g/dL (ref 6.0–8.3)

## 2013-01-27 LAB — LIPASE, BLOOD: Lipase: 34 U/L (ref 11–59)

## 2013-01-27 LAB — URINALYSIS, ROUTINE W REFLEX MICROSCOPIC
GLUCOSE, UA: NEGATIVE mg/dL
KETONES UR: 15 mg/dL — AB
Nitrite: NEGATIVE
PROTEIN: 100 mg/dL — AB
Specific Gravity, Urine: 1.026 (ref 1.005–1.030)
Urobilinogen, UA: 1 mg/dL (ref 0.0–1.0)
pH: 5.5 (ref 5.0–8.0)

## 2013-01-27 LAB — URINE MICROSCOPIC-ADD ON

## 2013-01-27 MED ORDER — TETRACAINE HCL 0.5 % OP SOLN
2.0000 [drp] | Freq: Once | OPHTHALMIC | Status: AC
Start: 2013-01-27 — End: 2013-01-27
  Administered 2013-01-27: 2 [drp] via OPHTHALMIC
  Filled 2013-01-27: qty 2

## 2013-01-27 MED ORDER — FLUORESCEIN SODIUM 1 MG OP STRP
1.0000 | ORAL_STRIP | Freq: Once | OPHTHALMIC | Status: AC
Start: 2013-01-27 — End: 2013-01-27
  Administered 2013-01-27: 1 via OPHTHALMIC
  Filled 2013-01-27: qty 1

## 2013-01-27 MED ORDER — OXYCODONE-ACETAMINOPHEN 10-325 MG PO TABS: 1.0000 | ORAL_TABLET | ORAL | Status: DC | PRN

## 2013-01-27 MED ORDER — HYDROMORPHONE HCL PF 1 MG/ML IJ SOLN
2.0000 mg | Freq: Once | INTRAMUSCULAR | Status: AC
  Administered 2013-01-27: 2 mg via INTRAMUSCULAR
  Filled 2013-01-27 (×2): qty 2

## 2013-01-27 MED ORDER — HYDROMORPHONE HCL PF 1 MG/ML IJ SOLN: 1.0000 mg | Freq: Once | INTRAMUSCULAR | Status: DC
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
## 2013-01-27 NOTE — Discharge Instructions (Signed)
Eye, Foreign Body The term foreign body refers to any object near, on the surface of or in the eye that should not be there. A foreign body may be a small speck of dirt or dust, a hair or eyelash, a splinter or any object. CAUSES  Foreign bodies can get in the eye by:  Flying pieces of something that was broken or destroyed (debris).  A sudden injury (trauma) to the eye. SYMPTOMS  Symptoms depend on what the foreign body is and where it is in the eye. The most common locations are:  On the inner surface of the upper or lower eyelids or on the covering of the white part of the eye (conjunctiva). Symptoms in this location are:  Irritating and painful, especially when blinking.  Feeling like something is in the eye.  On the surface of the clear covering on the front of the eye (cornea). A corneal foreign body has symptoms that:  Are painful and irritating since the cornea is very sensitive.  Form small "rust rings" around a metallic foreign body. Metallic foreign bodies stick more firmly to the surface of the cornea.  Inside the eyeball. Infection can happen fast and can be hard to treat with antibiotics. This is an extremely dangerous situation. Foreign bodies inside the eye can threaten vision. A person may even loose their eye. Foreign bodies inside the eye may cause:  Great pain.  Immediate loss of vision. DIAGNOSIS  Foreign bodies are found during an exam by an eye specialist. Those that are on the eyelids, conjunctiva or cornea are usually (but not always) easily found. When a foreign body is inside the eyeball, a cataract may form almost right away. This makes it hard for an ophthalmologist to find the foreign body. Special tests may be needed, including ultrasound testing, X-rays and CT scans. TREATMENT   Foreign bodies that are on the eyelids, conjunctiva or cornea are often removed easily and painlessly.  If the foreign body has caused a scratch or abrasion of the cornea,  antibiotic drops, ointments and/or a tight patch called a "pressure patch" may be needed. Follow-up exams will be needed for several days until the abrasion heals.  Surgery is needed right away if the foreign body is inside the eyeball. This is a medical emergency. An antibiotic therapy will likely be given to stop an infection. HOME CARE INSTRUCTIONS  The use of eye patches is not universal. Their use varies from state to state and from caregiver to caregiver. If an eye patch was applied:  Keep the eye patch on for as long as directed by your caregiver until the follow-up appointment.  Do not remove the patch to put in medications unless instructed to do so. When replacing the patch, retape it as it was before. Follow the same procedure if the patch becomes loose.  WARNING: Do not drive or operate machinery while the eye is patched. The ability to judge distances will be impaired.  Only take over-the-counter or prescription medicines for pain, discomfort or fever as directed by the caregiver. If no eye patch was applied:  Keep the eye closed as much as possible. Do not rub the eye.  Wear dark glasses as needed to protect the eyes from bright light.  Do not wear contact lenses until the eye feels normal again, or as instructed.  Wear protective eye covering if there is a risk of eye injury. This is important when working with high speed tools.  Only take over-the-counter or   prescription medicines for pain, discomfort or fever as directed by the caregiver. SEEK IMMEDIATE MEDICAL CARE IF:   Pain increases in the eye or the vision changes.  You or your child has problems with the eye patch.  The injury to the eye appears to be getting larger.  There is discharge from the injured eye.  Swelling and/or soreness (inflammation) develops around the affected eye.  You or your child has an oral temperature above 102 F (38.9 C), not controlled by medicine.  Your baby is older than 3  months with a rectal temperature of 102 F (38.9 C) or higher.  Your baby is 3 months old or younger with a rectal temperature of 100.4 F (38 C) or higher. MAKE SURE YOU:   Understand these instructions.  Will watch your condition.  Will get help right away if you are not doing well or get worse. Document Released: 01/05/2005 Document Revised: 03/30/2011 Document Reviewed: 06/02/2012 [employer] Patient Information 2014 [employer].  

## 2013-01-27 NOTE — ED Notes (Signed)
Pt stated that he didn't want the Jerry Terrell placed back in eyes because said he felt better; he stated he was ready to leave and he needed 3 days of Percocet to hold him
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
## 2013-01-27 NOTE — ED Notes (Signed)
Morgan lens placed bilaterally in eyes; 500 mls to rinse eyes (currently running)

## 2013-01-29 ENCOUNTER — Encounter (HOSPITAL_COMMUNITY): Payer: Self-pay | Admitting: Emergency Medicine

## 2013-01-29 ENCOUNTER — Telehealth: Payer: Self-pay | Admitting: Internal Medicine

## 2013-01-29 DIAGNOSIS — I129 Hypertensive chronic kidney disease with stage 1 through stage 4 chronic kidney disease, or unspecified chronic kidney disease: Secondary | ICD-10-CM | POA: Insufficient documentation

## 2013-01-29 DIAGNOSIS — Z79899 Other long term (current) drug therapy: Secondary | ICD-10-CM | POA: Insufficient documentation

## 2013-01-29 DIAGNOSIS — Z8619 Personal history of other infectious and parasitic diseases: Secondary | ICD-10-CM | POA: Insufficient documentation

## 2013-01-29 DIAGNOSIS — N189 Chronic kidney disease, unspecified: Secondary | ICD-10-CM | POA: Insufficient documentation

## 2013-01-29 DIAGNOSIS — K8689 Other specified diseases of pancreas: Secondary | ICD-10-CM | POA: Insufficient documentation

## 2013-01-29 DIAGNOSIS — F17200 Nicotine dependence, unspecified, uncomplicated: Secondary | ICD-10-CM | POA: Insufficient documentation

## 2013-01-29 DIAGNOSIS — G8929 Other chronic pain: Secondary | ICD-10-CM | POA: Insufficient documentation

## 2013-01-29 DIAGNOSIS — Z862 Personal history of diseases of the blood and blood-forming organs and certain disorders involving the immune mechanism: Secondary | ICD-10-CM | POA: Insufficient documentation

## 2013-01-29 DIAGNOSIS — Z9889 Other specified postprocedural states: Secondary | ICD-10-CM | POA: Insufficient documentation

## 2013-01-29 DIAGNOSIS — Z8639 Personal history of other endocrine, nutritional and metabolic disease: Secondary | ICD-10-CM | POA: Insufficient documentation

## 2013-01-29 DIAGNOSIS — K219 Gastro-esophageal reflux disease without esophagitis: Secondary | ICD-10-CM | POA: Insufficient documentation

## 2013-01-29 DIAGNOSIS — R319 Hematuria, unspecified: Secondary | ICD-10-CM | POA: Insufficient documentation

## 2013-01-29 DIAGNOSIS — J45909 Unspecified asthma, uncomplicated: Secondary | ICD-10-CM | POA: Insufficient documentation

## 2013-01-29 LAB — URINALYSIS, ROUTINE W REFLEX MICROSCOPIC
BILIRUBIN URINE: NEGATIVE
Glucose, UA: NEGATIVE mg/dL
KETONES UR: NEGATIVE mg/dL
NITRITE: NEGATIVE
PH: 6 (ref 5.0–8.0)
Protein, ur: NEGATIVE mg/dL
Specific Gravity, Urine: 1.003 — ABNORMAL LOW (ref 1.005–1.030)
Urobilinogen, UA: 1 mg/dL (ref 0.0–1.0)

## 2013-01-29 LAB — COMPREHENSIVE METABOLIC PANEL
ALBUMIN: 4.1 g/dL (ref 3.5–5.2)
ALK PHOS: 100 U/L (ref 39–117)
ALT: 35 U/L (ref 0–53)
AST: 29 U/L (ref 0–37)
BUN: 14 mg/dL (ref 6–23)
CHLORIDE: 95 meq/L — AB (ref 96–112)
CO2: 25 mEq/L (ref 19–32)
Calcium: 9.7 mg/dL (ref 8.4–10.5)
Creatinine, Ser: 1.08 mg/dL (ref 0.50–1.35)
GFR calc non Af Amer: 76 mL/min — ABNORMAL LOW (ref >90)
GFR, EST AFRICAN AMERICAN: 88 mL/min — AB (ref >90)
Glucose, Bld: 111 mg/dL — ABNORMAL HIGH (ref 70–99)
POTASSIUM: 3.9 meq/L (ref 3.7–5.3)
Sodium: 134 mEq/L — ABNORMAL LOW (ref 137–147)
Total Bilirubin: 1.2 mg/dL (ref 0.3–1.2)
Total Protein: 7.7 g/dL (ref 6.0–8.3)

## 2013-01-29 LAB — CBC WITH DIFFERENTIAL/PLATELET
BASOS PCT: 0 % (ref 0–1)
Basophils Absolute: 0 10*3/uL (ref 0.0–0.1)
Eosinophils Absolute: 0.2 10*3/uL (ref 0.0–0.7)
Eosinophils Relative: 2 % (ref 0–5)
HCT: 46.6 % (ref 39.0–52.0)
HEMOGLOBIN: 17.2 g/dL — AB (ref 13.0–17.0)
Lymphocytes Relative: 57 % — ABNORMAL HIGH (ref 12–46)
Lymphs Abs: 4.7 10*3/uL — ABNORMAL HIGH (ref 0.7–4.0)
MCH: 32.5 pg (ref 26.0–34.0)
MCHC: 36.9 g/dL — ABNORMAL HIGH (ref 30.0–36.0)
MCV: 88.1 fL (ref 78.0–100.0)
Monocytes Absolute: 0.7 10*3/uL (ref 0.1–1.0)
Monocytes Relative: 8 % (ref 3–12)
NEUTROS ABS: 2.7 10*3/uL (ref 1.7–7.7)
Neutrophils Relative %: 33 % — ABNORMAL LOW (ref 43–77)
Platelets: 246 10*3/uL (ref 150–400)
RBC: 5.29 MIL/uL (ref 4.22–5.81)
RDW: 12.6 % (ref 11.5–15.5)
WBC: 8.3 10*3/uL (ref 4.0–10.5)

## 2013-01-29 LAB — URINE MICROSCOPIC-ADD ON

## 2013-01-29 LAB — LIPASE, BLOOD: LIPASE: 50 U/L (ref 11–59)

## 2013-01-29 NOTE — ED Provider Notes (Signed)
History/physical exam/procedure(s) were performed by non-physician practitioner and as supervising physician I was immediately available for consultation/collaboration. I have reviewed all notes and am in agreement with care and plan.   Shaune Pollack, MD 01/29/13 [phone]

## 2013-01-29 NOTE — Telephone Encounter (Signed)
Patient paged the on-call resident. Stated that he started peeing blood tonight, one hour ago. He has an appointment at [facility] for an MRI tomorrow and he would like to go there. He states that he is "hurting from his pancrease" but this is a chronic pain. He also states that he has prostate problems but has an upcoming appointment with Urology for this problem. He was very concerned about the blood in his urine and wanted to be seen tonight  by his PCP, Dr. Aundra Dubin.   I advised him that since the clinic is closed tonight he should go to the ED for evaluation of his gross hematuria and possible UTI. He again insisted in being seen in the ED by his PCP, Dr. Marigene Ehlers, but I explained to him that and Emergency Medicine physician would see him, not an [facility] provider. He verbalized understanding and agreed to go to the ED tonight.

## 2013-01-29 NOTE — ED Notes (Signed)
Pt. reports hematuria onset this evening , denies injury or dysuria , pt. also reported chronic left upper abdominal pain for years , denies nausea or vomitting , no fever or chills.

## 2013-01-29 NOTE — ED Notes (Signed)
Nurse explained delay , wait time /high census and process to pt.

## 2013-01-30 ENCOUNTER — Emergency Department (HOSPITAL_COMMUNITY): Payer: No Typology Code available for payment source

## 2013-01-30 ENCOUNTER — Emergency Department (HOSPITAL_COMMUNITY)
Admission: EM | Admit: 2013-01-30 | Discharge: 2013-01-30 | Disposition: A | Discharge status: Home / Self Care | Payer: No Typology Code available for payment source | Attending: Emergency Medicine | Admitting: Emergency Medicine

## 2013-01-30 DIAGNOSIS — K8689 Other specified diseases of pancreas: Secondary | ICD-10-CM

## 2013-01-30 DIAGNOSIS — R319 Hematuria, unspecified: Secondary | ICD-10-CM

## 2013-01-30 DIAGNOSIS — R109 Unspecified abdominal pain: Secondary | ICD-10-CM

## 2013-01-30 MED ORDER — CEPHALEXIN 500 MG PO CAPS: 500.0000 mg | ORAL_CAPSULE | Freq: Four times a day (QID) | ORAL | Status: DC

## 2013-01-30 MED ORDER — STERILE WATER FOR INJECTION IJ SOLN
INTRAMUSCULAR | Status: AC
  Administered 2013-01-30: 2.1 mL
  Filled 2013-01-30: qty 10

## 2013-01-30 MED ORDER — HYDROMORPHONE HCL PF 1 MG/ML IJ SOLN
1.0000 mg | INTRAMUSCULAR | Status: AC
  Administered 2013-01-30: 1 mg via INTRAMUSCULAR
  Filled 2013-01-30: qty 1

## 2013-01-30 MED ORDER — CEFTRIAXONE SODIUM 1 G IJ SOLR
1.0000 g | Freq: Once | INTRAMUSCULAR | Status: AC
  Administered 2013-01-30: 1 g via INTRAMUSCULAR
  Filled 2013-01-30: qty 10

## 2013-01-30 MED ORDER — OXYCODONE-ACETAMINOPHEN 5-325 MG PO TABS: 2.0000 | ORAL_TABLET | ORAL | Status: DC | PRN

## 2013-01-30 NOTE — ED Notes (Signed)
Pt states that he has an appointment in [place] at [place] in the morning for an MRI that he can not miss with a "cancer specialist"
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
## 2013-01-30 NOTE — ED Notes (Signed)
Pts spouse is driving home.

## 2013-01-30 NOTE — ED Notes (Signed)
Pt given d/c instructions and verbalized understanding. NAD at this time.  

## 2013-01-30 NOTE — Discharge Instructions (Signed)
Abdominal Pain, Adult °Many things can cause abdominal pain. Usually, abdominal pain is not caused by a disease and will improve without treatment. It can often be observed and treated at home. Your health care provider will do a physical exam and possibly order blood tests and X-rays to help determine the seriousness of your pain. However, in many cases, more time must pass before a clear cause of the pain can be found. Before that point, your health care provider may not know if you need more testing or further treatment. °HOME CARE INSTRUCTIONS  °Monitor your abdominal pain for any changes. The following actions may help to alleviate any discomfort you are experiencing: °· Only take over-the-counter or prescription medicines as directed by your health care provider. °· Do not take laxatives unless directed to do so by your health care provider. °· Try a clear liquid diet (broth, tea, or water) as directed by your health care provider. Slowly move to a bland diet as tolerated. °SEEK MEDICAL CARE IF: °· You have unexplained abdominal pain. °· You have abdominal pain associated with nausea or diarrhea. °· You have pain when you urinate or have a bowel movement. °· You experience abdominal pain that wakes you in the night. °· You have abdominal pain that is worsened or improved by eating food. °· You have abdominal pain that is worsened with eating fatty foods. °SEEK IMMEDIATE MEDICAL CARE IF:  °· Your pain does not go away within 2 hours. °· You have a fever. °· You keep throwing up (vomiting). °· Your pain is felt only in portions of the abdomen, such as the right side or the left lower portion of the abdomen. °· You pass bloody or black tarry stools. °MAKE SURE YOU: °· Understand these instructions.   °· Will watch your condition.   °· Will get help right away if you are not doing well or get worse.   °Document Released: 10/15/2004 Document Revised: 10/26/2012 Document Reviewed: 09/14/2012 °[employer]® Patient  Information ©2014 [employer]. ° °

## 2013-01-30 NOTE — ED Notes (Signed)
The pt is constantly c/o the wait

## 2013-01-30 NOTE — ED Notes (Signed)
The pt has called the a-c and he was told the same thing he has been told numerus times.  There are no rooms in the back.

## 2013-01-30 NOTE — ED Notes (Signed)
Pt states tnat he is having throbbing in his legs and pain in his groin that is going into his left side.  Pt states he has some vision issue from glass in his left eye, and he is having "bright red" blood in his urine

## 2013-01-31 LAB — URINE CULTURE
Colony Count: NO GROWTH
Culture: NO GROWTH

## 2013-02-02 ENCOUNTER — Ambulatory Visit (INDEPENDENT_AMBULATORY_CARE_PROVIDER_SITE_OTHER): Payer: No Typology Code available for payment source | Admitting: Internal Medicine

## 2013-02-02 ENCOUNTER — Encounter: Payer: Self-pay | Admitting: Internal Medicine

## 2013-02-02 VITALS — BP 137/89 | HR 68 | Temp 99.3°F | Ht 68.0 in | Wt 169.5 lb

## 2013-02-02 DIAGNOSIS — R319 Hematuria, unspecified: Secondary | ICD-10-CM | POA: Insufficient documentation

## 2013-02-02 DIAGNOSIS — R1013 Epigastric pain: Secondary | ICD-10-CM

## 2013-02-02 DIAGNOSIS — D72820 Lymphocytosis (symptomatic): Secondary | ICD-10-CM

## 2013-02-02 DIAGNOSIS — G8929 Other chronic pain: Secondary | ICD-10-CM

## 2013-02-02 DIAGNOSIS — R109 Unspecified abdominal pain: Secondary | ICD-10-CM

## 2013-02-02 LAB — CBC WITH DIFFERENTIAL/PLATELET
BASOS PCT: 1 % (ref 0–1)
Basophils Absolute: 0.1 10*3/uL (ref 0.0–0.1)
EOS ABS: 0 10*3/uL (ref 0.0–0.7)
Eosinophils Relative: 1 % (ref 0–5)
HEMATOCRIT: 48.5 % (ref 39.0–52.0)
HEMOGLOBIN: 17.6 g/dL — AB (ref 13.0–17.0)
LYMPHS ABS: 2.5 10*3/uL (ref 0.7–4.0)
Lymphocytes Relative: 52 % — ABNORMAL HIGH (ref 12–46)
MCH: 32.1 pg (ref 26.0–34.0)
MCHC: 36.3 g/dL — AB (ref 30.0–36.0)
MCV: 88.5 fL (ref 78.0–100.0)
MONO ABS: 0.6 10*3/uL (ref 0.1–1.0)
Monocytes Relative: 12 % (ref 3–12)
Neutro Abs: 1.6 10*3/uL — ABNORMAL LOW (ref 1.7–7.7)
Neutrophils Relative %: 34 % — ABNORMAL LOW (ref 43–77)
Platelets: 250 10*3/uL (ref 150–400)
RBC: 5.48 MIL/uL (ref 4.22–5.81)
RDW: 12.4 % (ref 11.5–15.5)
WBC: 4.7 10*3/uL (ref 4.0–10.5)

## 2013-02-02 LAB — PROTIME-INR
INR: 0.89 (ref <1.50)
Prothrombin Time: 11.9 seconds (ref 11.6–15.2)

## 2013-02-02 MED ORDER — LEVOFLOXACIN 500 MG PO TABS: 500.0000 mg | ORAL_TABLET | Freq: Every day | ORAL | Status: AC

## 2013-02-02 MED ORDER — OXYCODONE-ACETAMINOPHEN 10-325 MG PO TABS: 1.0000 | ORAL_TABLET | Freq: Four times a day (QID) | ORAL | Status: DC | PRN

## 2013-02-02 MED ORDER — OXYCODONE-ACETAMINOPHEN 5-325 MG PO TABS: 2.0000 | ORAL_TABLET | ORAL | Status: DC | PRN

## 2013-02-02 NOTE — Progress Notes (Signed)
I saw and evaluated the patient.  I personally confirmed the key portions of the history and exam documented by Dr. Komanski and I reviewed pertinent patient test results.  The assessment, diagnosis, and plan were formulated together and I agree with the documentation in the resident's note.  

## 2013-02-02 NOTE — Assessment & Plan Note (Signed)
A: Patient has had ED workup for hematuria. No signs of stones. UA concerning for infection, but culture was negative. This may have been due to ceftriaxone that was administered in the ED. As patient has low INR as of October of last year, the patient may be hypocoagulable. Patient reports improvement since starting cephalexin.   P: The patient has hematuria of an unknown cause at this time. I recommend referral for a visit to see his urologist ASAP. I am checking CBC and PT/INR today. I have recommended changing the patients prescription from cephalexin to levaquin in order for better e. Coli coverage os possible prostatitis.

## 2013-02-02 NOTE — Assessment & Plan Note (Signed)
A: Patient has chronic epigastric abdominal pain. It may or may not be due to pancreatic cystic lesion. Regardless, the patient has been recently prescribed 180 tablets of oxy 10/325 mg by the patients PCP. He has run out. He has an appointment with Dr. Catheryn Bacon of [facility] on Jan 27th. The patient reports taking on average 5 tablets of 10 /325 mg oxy daily.  P: I prescribed the patient 120 tablets of oxycodone 5/325 in order to cover him for the next 12 days. After this time, the patient will be be receiving pain medications from [facility]. I informed the patient of this and he is in agreement with this plan.
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
## 2013-02-02 NOTE — Progress Notes (Signed)
Appt at [facility] has been re-scheduled for Jan 28 @ 1245PM; pt called /informed. Telephone# [phone].

## 2013-02-02 NOTE — Patient Instructions (Addendum)
Please take 2 tablets of oxycodone-APAP 5-325 mg every 6 hours as needed for pain. I have prescribed you 120 tablets to cover you for the next 12 days. We will be unable to fill this prescription again. Please make sure to go to your pain management appointment on the 25 of this month.  I am referring you for a Urology appointment. The office will call you with you appointment.   I am changing you from cephelexin to levaquin 500 mg once daily for ten days. If you cannot afford the levaquin, please continue to take the cephelexin.

## 2013-02-02 NOTE — Assessment & Plan Note (Signed)
A: Likely due to chronic hep c infection.  P: Continue to monitor CBC.

## 2013-02-06 ENCOUNTER — Telehealth: Payer: Self-pay | Admitting: Internal Medicine

## 2013-02-06 NOTE — Telephone Encounter (Signed)
Called about Levaquin Rx.  Patient ankles started swelling and aching in joints, Knees got weak after taking medication.  He also had a h/a that would not go away.  He drank a lot of water and no more levaquin.  His joints are feeling better.  He tolerated Keflex previously and states it was working good.    Advised to f/u with Urology 02/15/13   Aundra Dubin MD

## 2013-02-06 NOTE — Telephone Encounter (Signed)
Advised pt if not getting better come to the ED.  He will resume Keflex for prostatitis until f/u with [facility]   Aundra Dubin MD

## 2013-02-16 ENCOUNTER — Encounter: Payer: Self-pay | Admitting: Internal Medicine

## 2013-02-16 ENCOUNTER — Ambulatory Visit (INDEPENDENT_AMBULATORY_CARE_PROVIDER_SITE_OTHER): Payer: No Typology Code available for payment source | Admitting: Internal Medicine

## 2013-02-16 VITALS — BP 141/93 | HR 110 | Temp 99.0°F | Wt 168.6 lb

## 2013-02-16 DIAGNOSIS — F17200 Nicotine dependence, unspecified, uncomplicated: Secondary | ICD-10-CM

## 2013-02-16 DIAGNOSIS — R894 Abnormal immunological findings in specimens from other organs, systems and tissues: Secondary | ICD-10-CM

## 2013-02-16 DIAGNOSIS — K029 Dental caries, unspecified: Secondary | ICD-10-CM

## 2013-02-16 DIAGNOSIS — K863 Pseudocyst of pancreas: Secondary | ICD-10-CM

## 2013-02-16 DIAGNOSIS — K0889 Other specified disorders of teeth and supporting structures: Secondary | ICD-10-CM

## 2013-02-16 DIAGNOSIS — K089 Disorder of teeth and supporting structures, unspecified: Secondary | ICD-10-CM

## 2013-02-16 DIAGNOSIS — N411 Chronic prostatitis: Secondary | ICD-10-CM

## 2013-02-16 DIAGNOSIS — K0856 Poor aesthetic of existing restoration of tooth: Secondary | ICD-10-CM

## 2013-02-16 DIAGNOSIS — I10 Essential (primary) hypertension: Secondary | ICD-10-CM

## 2013-02-16 DIAGNOSIS — R319 Hematuria, unspecified: Secondary | ICD-10-CM

## 2013-02-16 DIAGNOSIS — R768 Other specified abnormal immunological findings in serum: Secondary | ICD-10-CM

## 2013-02-16 DIAGNOSIS — K862 Cyst of pancreas: Secondary | ICD-10-CM

## 2013-02-16 MED ORDER — OXYCODONE-ACETAMINOPHEN 5-325 MG PO TABS: 2.0000 | ORAL_TABLET | ORAL | Status: DC | PRN

## 2013-02-16 NOTE — Assessment & Plan Note (Signed)
Dental caries and two fillings fell out  Referred to dental clinic  Rec. Pt use Anbesol in the meantime

## 2013-02-16 NOTE — Assessment & Plan Note (Signed)
Will follow with [facility] for tx

## 2013-02-16 NOTE — Assessment & Plan Note (Signed)
Follows with [facility] GI, surgical oncology Will have serial MRI 07/2013  Rx pain med Percocet 5-325 (1-2 tabs) q 4 hours prn today. Prefer if pt establish with pain clinic b/c unsure if abdominal pain is truly from pancreatitic pseudocyst source as 2 GI specialist in the past have said if should not cause pain but surgical oncology note states unsure.

## 2013-02-16 NOTE — Assessment & Plan Note (Signed)
  Assessment: Progress toward smoking cessation:  smoking less (smoking less ) Barriers to progress toward smoking cessation:  none Comments: none  Plan: Instruction/counseling given:  I counseled patient on the dangers of tobacco use, advised patient to stop smoking, and reviewed strategies to maximize success. Educational resources provided:  [employer] account manager) brochure Self management tools provided:  other (see comments) Medications to assist with smoking cessation:  None Patient agreed to the following self-care plans for smoking cessation: call [employer] (1-800-QUIT-NOW)  Other plans: none

## 2013-02-16 NOTE — Progress Notes (Addendum)
Subjective:    Patient ID: Jerry Terrell, male    DOB: 1960-10-08, 53 y.o.   MRN: [id number]  HPI Comments: 53 y.o male Past Medical History asthma, CAD, Bronchitis, chronic h/a, HCV + (follows with [facility] who may be putting him on a new drug soon), Chronic pain (back, abdomen), Hypertension (BP 141/93 today),GERD (gastroesophageal reflux disease), chronic prostatitis (follows w/ urology at [facility]), H/O hiatal hernia, h/o chronic pancreatitis, History of stomach ulcers, Pancreatic cyst (being followed by GI and surgical oncology at [facility]), GAD, simple cyst right kidney, mixed urinary incontinence.     1)Dental pain/caries: 2 fillings fell out today while pt was eating cereal. He went to [employer] and bought some puddy and placed the puddy in his mouth.  Two teeth are on the bottom.  He has sensitivity to hot/cold.  He requests dental referral   2) Abdominal pain chronic. History of pancreatic pseudocyst followed by GI and surgical oncology (Dr. Crisoforo Oxford) at [facility].  He will have an MRI 08/08/13 and f/u with Dr. Crisoforo Oxford.  He waits until the end of the appt and request Rx refill of percocet 10 mg q 4 hours prn.  He is to follow up with pain clinic and had appt 1/27.  With history of substance abuse and multiple providers filling pain Rx, pain clinic will not fill narcotics at this time and rec. neuropsych testing for biofeedback for pain control.  He states he will get pain medicatoin from [place].    3)Chronic prostatitis/UTI-follows with [facility] urology.  They are concerned about blood in his urine. He will have a CT urogram 03/08/13.    4) HCV-follows at [facility] are going to try treating his HCV in the future.  Appt with GI (Dr. Reeves Dam) to check liver 02/20/13.   5) He asks for a coupoun for Zofran when he needs this medication for nausea  He requests a letter for disability or to get insurance.  Letter written see letter tab 02/16/13   [id number] for disability 1st attempted  denied. Smoking less but now smoking E-cig and asks about side effects.  Informed SE cancer and pt stated he will quit.         Dental Pain  This is a new problem. The current episode started today. Associated symptoms include thermal sensitivity. He has tried nothing for the symptoms. The treatment provided no relief.      Review of Systems  HENT: Positive for dental problem.   Gastrointestinal: Negative for nausea and vomiting.       +chronic ab pain        Objective:   Physical Exam  Nursing note and vitals reviewed. Constitutional: He is oriented to person, place, and time. Vital signs are normal. He appears well-developed and well-nourished. He is cooperative.  HENT:  Head: Normocephalic and atraumatic.  Mouth/Throat: Oropharynx is clear and moist and mucous membranes are normal. Abnormal dentition. No oropharyngeal exudate.    Eyes: Conjunctivae are normal. Pupils are equal, round, and reactive to light. Right eye exhibits no discharge. Left eye exhibits no discharge. No scleral icterus.  Cardiovascular: Normal rate, regular rhythm, S1 normal, S2 normal and normal heart sounds.   No murmur heard. Pulmonary/Chest: Effort normal and breath sounds normal.  Abdominal: Soft. Bowel sounds are normal. He exhibits no distension. There is no tenderness.  Musculoskeletal: He exhibits no edema.  Neurological: He is alert and oriented to person, place, and time.  Skin: Skin is warm, dry and intact. No rash noted.  Psychiatric: He has a normal mood and affect. His speech is normal and behavior is normal. Judgment and thought content normal. Cognition and memory are normal.          Assessment & Plan:  F/u in 3-4 months, sooner if needed

## 2013-02-16 NOTE — Patient Instructions (Addendum)
General Instructions: Please try Anbesol for dental pain. We will try to get you dental follow up  Follow up in 3-4 months sooner if needed  Try to stop smoking and using E-cigarettes Keep following up with your doctors at [facility] below   Treatment Goals:  Goals (1 Years of Data) as of 02/16/13         As of Today 02/02/13 01/30/13 01/30/13 01/29/13     Blood Pressure    . Blood Pressure < 140/90  141/93 137/89 147/67 148/99 145/91      Progress Toward Treatment Goals:  Treatment Goal 02/16/2013  Blood pressure at goal  Stop smoking smoking less    Self Care Goals & Plans:  Self Care Goal 02/16/2013  Manage my medications refill my medications on time; bring my medications to every visit; take my medicines as prescribed  Monitor my health keep track of my blood pressure  Eat healthy foods drink diet soda or water instead of juice or soda; eat more vegetables; eat foods that are low in salt; eat baked foods instead of fried foods; eat fruit for snacks and desserts; eat smaller portions  Be physically active find an activity I enjoy  Stop smoking call [facility] (1-800-QUIT-NOW)  Meeting treatment goals maintain the current self-care plan    No flowsheet data found.   Care Management & Community Referrals:  Referral 02/16/2013  Referrals made for care management support none needed  Referrals made to community resources none        Dental Pain A tooth ache may be caused by cavities (tooth decay). Cavities expose the nerve of the tooth to air and hot or cold temperatures. It may come from an infection or abscess (also called a boil or furuncle) around your tooth. It is also often caused by dental caries (tooth decay). This causes the pain you are having. DIAGNOSIS  Your caregiver can diagnose this problem by exam. TREATMENT   If caused by an infection, it may be treated with medications which kill germs (antibiotics) and pain medications as prescribed by your  caregiver. Take medications as directed.  Only take over-the-counter or prescription medicines for pain, discomfort, or fever as directed by your caregiver.  Whether the tooth ache today is caused by infection or dental disease, you should see your dentist as soon as possible for further care. SEEK MEDICAL CARE IF: The exam and treatment you received today has been provided on an emergency basis only. This is not a substitute for complete medical or dental care. If your problem worsens or new problems (symptoms) appear, and you are unable to meet with your dentist, call or return to this location. SEEK IMMEDIATE MEDICAL CARE IF:   You have a fever.  You develop redness and swelling of your face, jaw, or neck.  You are unable to open your mouth.  You have severe pain uncontrolled by pain medicine. MAKE SURE YOU:   Understand these instructions.  Will watch your condition.  Will get help right away if you are not doing well or get worse. Document Released: 01/05/2005 Document Revised: 03/30/2011 Document Reviewed: 08/24/2007 [facility] Patient Information 2014 [place].  Dental Caries  Dental caries (also called tooth decay) is the most common oral disease. It can occur at any age, but is more common in children and young adults.  HOW DENTAL CARIES DEVELOPS  The process of decay begins when bacteria and foods (particularly sugars and starches) combine in your mouth to produce  plaque. Plaque is a substance that sticks to the hard, outer surface of a tooth (enamel). The bacteria in plaque produce acids that attack enamel. These acids may also attack the root surface of a tooth (cementum) if it is exposed. Repeated attacks dissolve these surfaces and create holes in the tooth (cavities). If left untreated, the acids destroy the other layers of the tooth.  RISK FACTORS  Frequent sipping of sugary beverages.   Frequent snacking on sugary and starchy foods, especially those that  easily get stuck in the teeth.   Poor oral hygiene.   Dry mouth.   Substance abuse such as methamphetamine abuse.   Broken or poor-fitting dental restorations.   Eating disorders.   Gastroesophageal reflux disease (GERD).   Certain radiation treatments to the head and neck. SYMPTOMS In the early stages of dental caries, symptoms are seldom present. Sometimes white, chalky areas may be seen on the enamel or other tooth layers. In later stages, symptoms may include:  Pits and holes on the enamel.  Toothache after sweet, hot, or cold foods or drinks are consumed.  Pain around the tooth.  Swelling around the tooth. DIAGNOSIS  Most of the time, dental caries is detected during a regular dental checkup. A diagnosis is made after a thorough medical and dental history is taken and the surfaces of your teeth are checked for signs of dental caries. Sometimes special instruments, such as lasers, are used to check for dental caries. Dental X-ray exams may be taken so that areas not visible to the eye (such as between the contact areas of the teeth) can be checked for cavities.  TREATMENT  If dental caries is in its early stages, it may be reversed with a fluoride treatment or an application of a remineralizing agent at the dental office. Thorough brushing and flossing at home is needed to aid these treatments. If it is in its later stages, treatment depends on the location and extent of tooth destruction:   If a small area of the tooth has been destroyed, the destroyed area will be removed and cavities will be filled with a material such as gold, silver amalgam, or composite resin.   If a large area of the tooth has been destroyed, the destroyed area will be removed and a cap (crown) will be fitted over the remaining tooth structure.   If the center part of the tooth (pulp) is affected, a procedure called a root canal will be needed before a filling or crown can be placed.   If most  of the tooth has been destroyed, the tooth may need to be pulled (extracted). HOME CARE INSTRUCTIONS You can prevent, stop, or reverse dental caries at home by practicing good oral hygiene. Good oral hygiene includes:  Thoroughly cleaning your teeth at least twice a day with a toothbrush and dental floss.   Using a fluoride toothpaste. A fluoride mouth rinse may also be used if recommended by your dentist or health care provider.   Restricting the amount of sugary and starchy foods and sugary liquids you consume.   Avoiding frequent snacking on these foods and sipping of these liquids.   Keeping regular visits with a dentist for checkups and cleanings. PREVENTION   Practice good oral hygiene.  Consider a dental sealant. A dental sealant is a coating material that is applied by your dentist to the pits and grooves of teeth. The sealant prevents food from being trapped in them. It may protect the teeth for several  years.  Ask about fluoride supplements if you live in a community without fluorinated water or with water that has a low fluoride content. Use fluoride supplements as directed by your dentist or health care provider.  Allow fluoride varnish applications to teeth if directed by your dentist or health care provider. Document Released: 09/27/2001 Document Revised: 09/07/2012 Document Reviewed: 01/08/2012 [facility] Patient Information 2014 [place].  Benzocaine mouth gel, ointment, solution, or dental paste What is this medicine? BENZOCAINE (BEN zoe kane) causes loss of feeling on the skin and in the mouth. This helps relieve mouth pain. This medicine may be used for other purposes; ask your health care provider or pharmacist if you have questions. COMMON BRAND NAME(S): Anbesol, Benzodent, Boil-Ease, Comfort Caine , Dry Socket Remedy, [place], Little Remedies for [employer], Orabase, Orajel Baby, Orajel Denture Plus, Orajel Protective, Orajel Severe Pain, Orajel Swabs,  Orajel, Oral Pain Relief , Pro-Caine, Topicale Xtra, Zilactin-B What should I tell my health care provider before I take this medicine? They need to know if you have any of these conditions: -mouth sores or infection -an unusual or allergic reaction to benzocaine, para-aminobenzoic acid (PABA), other medicines, foods, dyes, or preservatives -pregnant or trying to get pregnant -breast-feeding How should I use this medicine? This medicine is applied to the affected area of the mouth or gums. Wash your hands before and after using this medicine. Follow the directions on the label or those given to you by your doctor or health care professional. Do not use this medicine more often than directed. Talk to your pediatrician regarding the use of this medicine in children. While this medicine may be used in children as young as 4 months for selected conditions, precautions do apply. Overdosage: If you think you have taken too much of this medicine contact a poison control center or emergency room at once. NOTE: This medicine is only for you. Do not share this medicine with others. What if I miss a dose? This does not apply. What may interact with this medicine? -sulfonamides like sulfacetamide, sulfamethoxazole, sulfisoxazole, and others This list may not describe all possible interactions. Give your health care provider a list of all the medicines, herbs, non-prescription drugs, or dietary supplements you use. Also tell them if you smoke, drink alcohol, or use illegal drugs. Some items may interact with your medicine. What should I watch for while using this medicine? This medicine is not for long-term use. Do not use for longer than directed on the label or your doctor or health care professional. Do not use on large areas of broken or damaged skin. Contact your doctor or health care professional if your condition does not start to get better within a few days or if you notice redness, itching or  swelling. The affected area of your mouth will be numb. Try to avoid injury to that area. To avoid biting the tongue or cheek, or difficulty swallowing, do not chew gum or food until the numbness wears off. What side effects may I notice from receiving this medicine? Side effects that you should report to your doctor or health care professional as soon as possible: -allergic reactions like skin rash, itching or hives, swelling of the face, lips, or tongue -breathing problems -dizziness or drowsiness -fast or slow heartbeat -headache -increased sweating -restlessness, nervousness, anxiety -seizures -tremor Side effects that usually do not require medical attention (report to your doctor or health care professional if they continue or are bothersome): -redness, swelling, or pain This list  may not describe all possible side effects. Call your doctor for medical advice about side effects. You may report side effects to FDA at 1-800-FDA-1088. Where should I keep my medicine? Keep out of the reach of children. Store at room temperature between 15 and 30 degrees C (59 and 86 degrees F). Do not freeze. Throw away any unused medicine after the expiration date. NOTE: This sheet is a summary. It may not cover all possible information. If you have questions about this medicine, talk to your doctor, pharmacist, or health care provider.  2014, [employer]vier/[employer] Standard. (2007-04-06 14:49:13)  Smoking Cessation Quitting smoking is important to your health and has many advantages. However, it is not always easy to quit since nicotine is a very addictive drug. Often times, people try 3 times or more before being able to quit. This document explains the best ways for you to prepare to quit smoking. Quitting takes hard work and a lot of effort, but you can do it. ADVANTAGES OF QUITTING SMOKING  You will live longer, feel better, and live better.  Your body will feel the impact of quitting smoking almost  immediately.  Within 20 minutes, blood pressure decreases. Your pulse returns to its normal level.  After 8 hours, carbon monoxide levels in the blood return to normal. Your oxygen level increases.  After 24 hours, the chance of having a heart attack starts to decrease. Your breath, hair, and body stop smelling like smoke.  After 48 hours, damaged nerve endings begin to recover. Your sense of taste and smell improve.  After 72 hours, the body is virtually free of nicotine. Your bronchial tubes relax and breathing becomes easier.  After 2 to 12 weeks, lungs can hold more air. Exercise becomes easier and circulation improves.  The risk of having a heart attack, stroke, cancer, or lung disease is greatly reduced.  After 1 year, the risk of coronary heart disease is cut in half.  After 5 years, the risk of stroke falls to the same as a nonsmoker.  After 10 years, the risk of lung cancer is cut in half and the risk of other cancers decreases significantly.  After 15 years, the risk of coronary heart disease drops, usually to the level of a nonsmoker.  If you are pregnant, quitting smoking will improve your chances of having a healthy baby.  The people you live with, especially any children, will be healthier.  You will have extra money to spend on things other than cigarettes. QUESTIONS TO THINK ABOUT BEFORE ATTEMPTING TO QUIT You may want to talk about your answers with your caregiver.  Why do you want to quit?  If you tried to quit in the past, what helped and what did not?  What will be the most difficult situations for you after you quit? How will you plan to handle them?  Who can help you through the tough times? Your family? Friends? A caregiver?  What pleasures do you get from smoking? What ways can you still get pleasure if you quit? Here are some questions to ask your caregiver:  How can you help me to be successful at quitting?  What medicine do you think would be  best for me and how should I take it?  What should I do if I need more help?  What is smoking withdrawal like? How can I get information on withdrawal? GET READY  Set a quit date.  Change your environment by getting rid of all cigarettes, ashtrays, matches,  and lighters in your home, car, or work. Do not let people smoke in your home.  Review your past attempts to quit. Think about what worked and what did not. GET SUPPORT AND ENCOURAGEMENT You have a better chance of being successful if you have help. You can get support in many ways.  Tell your family, friends, and co-workers that you are going to quit and need their support. Ask them not to smoke around you.  Get individual, group, or telephone counseling and support. Programs are available at [employer] and health centers. Call your local health department for information about programs in your area.  Spiritual beliefs and practices may help some smokers quit.  Download a "quit meter" on your computer to keep track of quit statistics, such as how long you have gone without smoking, cigarettes not smoked, and money saved.  Get a self-help book about quitting smoking and staying off of tobacco. [place] yourself from urges to smoke. Talk to someone, go for a walk, or occupy your time with a task.  Change your normal routine. Take a different route to work. Drink tea instead of coffee. Eat breakfast in a different place.  Reduce your stress. Take a hot bath, exercise, or read a book.  Plan something enjoyable to do every day. Reward yourself for not smoking.  Explore interactive web-based programs that specialize in helping you quit. GET MEDICINE AND USE IT CORRECTLY Medicines can help you stop smoking and decrease the urge to smoke. Combining medicine with the above behavioral methods and support can greatly increase your chances of successfully quitting smoking.  Nicotine replacement  therapy helps deliver nicotine to your body without the negative effects and risks of smoking. Nicotine replacement therapy includes nicotine gum, lozenges, inhalers, nasal sprays, and skin patches. Some may be available over-the-counter and others require a prescription.  Antidepressant medicine helps people abstain from smoking, but how this works is unknown. This medicine is available by prescription.  Nicotinic receptor partial agonist medicine simulates the effect of nicotine in your brain. This medicine is available by prescription. Ask your caregiver for advice about which medicines to use and how to use them based on your health history. Your caregiver will tell you what side effects to look out for if you choose to be on a medicine or therapy. Carefully read the information on the package. Do not use any other product containing nicotine while using a nicotine replacement product.  RELAPSE OR DIFFICULT SITUATIONS Most relapses occur within the first 3 months after quitting. Do not be discouraged if you start smoking again. Remember, most people try several times before finally quitting. You may have symptoms of withdrawal because your body is used to nicotine. You may crave cigarettes, be irritable, feel very hungry, cough often, get headaches, or have difficulty concentrating. The withdrawal symptoms are only temporary. They are strongest when you first quit, but they will go away within 10 14 days. To reduce the chances of relapse, try to:  Avoid drinking alcohol. Drinking lowers your chances of successfully quitting.  Reduce the amount of caffeine you consume. Once you quit smoking, the amount of caffeine in your body increases and can give you symptoms, such as a rapid heartbeat, sweating, and anxiety.  Avoid smokers because they can make you want to smoke.  Do not let weight gain distract you. Many smokers will gain weight when they quit, usually less than 10 pounds. Eat a healthy diet  and stay active. You can always lose the weight gained after you quit.  Find ways to improve your mood other than smoking. FOR MORE INFORMATION  www.smokefree.gov  Document Released: 12/30/2000 Document Revised: 07/07/2011 Document Reviewed: 04/16/2011 [facility] Patient Information 2014 [place], [place].  Smoking Cessation Quitting smoking is important to your health and has many advantages. However, it is not always easy to quit since nicotine is a very addictive drug. Often times, people try 3 times or more before being able to quit. This document explains the best ways for you to prepare to quit smoking. Quitting takes hard work and a lot of effort, but you can do it. ADVANTAGES OF QUITTING SMOKING  You will live longer, feel better, and live better.  Your body will feel the impact of quitting smoking almost immediately.  Within 20 minutes, blood pressure decreases. Your pulse returns to its normal level.  After 8 hours, carbon monoxide levels in the blood return to normal. Your oxygen level increases.  After 24 hours, the chance of having a heart attack starts to decrease. Your breath, hair, and body stop smelling like smoke.  After 48 hours, damaged nerve endings begin to recover. Your sense of taste and smell improve.  After 72 hours, the body is virtually free of nicotine. Your bronchial tubes relax and breathing becomes easier.  After 2 to 12 weeks, lungs can hold more air. Exercise becomes easier and circulation improves.  The risk of having a heart attack, stroke, cancer, or lung disease is greatly reduced.  After 1 year, the risk of coronary heart disease is cut in half.  After 5 years, the risk of stroke falls to the same as a nonsmoker.  After 10 years, the risk of lung cancer is cut in half and the risk of other cancers decreases significantly.  After 15 years, the risk of coronary heart disease drops, usually to the level of a nonsmoker.  If you are pregnant,  quitting smoking will improve your chances of having a healthy baby.  The people you live with, especially any children, will be healthier.  You will have extra money to spend on things other than cigarettes. QUESTIONS TO THINK ABOUT BEFORE ATTEMPTING TO QUIT You may want to talk about your answers with your caregiver.  Why do you want to quit?  If you tried to quit in the past, what helped and what did not?  What will be the most difficult situations for you after you quit? How will you plan to handle them?  Who can help you through the tough times? Your family? Friends? A caregiver?  What pleasures do you get from smoking? What ways can you still get pleasure if you quit? Here are some questions to ask your caregiver:  How can you help me to be successful at quitting?  What medicine do you think would be best for me and how should I take it?  What should I do if I need more help?  What is smoking withdrawal like? How can I get information on withdrawal? GET READY  Set a quit date.  Change your environment by getting rid of all cigarettes, ashtrays, matches, and lighters in your home, car, or work. Do not let people smoke in your home.  Review your past attempts to quit. Think about what worked and what did not. GET SUPPORT AND ENCOURAGEMENT You have a better chance of being successful if you have help. You can get support in many ways.  Tell your  family, friends, and co-workers that you are going to quit and need their support. Ask them not to smoke around you.  Get individual, group, or telephone counseling and support. Programs are available at [employer] and health centers. Call your local health department for information about programs in your area.  Spiritual beliefs and practices may help some smokers quit.  Download a "quit meter" on your computer to keep track of quit statistics, such as how long you have gone without smoking, cigarettes not smoked, and money  saved.  Get a self-help book about quitting smoking and staying off of tobacco. [place] yourself from urges to smoke. Talk to someone, go for a walk, or occupy your time with a task.  Change your normal routine. Take a different route to work. Drink tea instead of coffee. Eat breakfast in a different place.  Reduce your stress. Take a hot bath, exercise, or read a book.  Plan something enjoyable to do every day. Reward yourself for not smoking.  Explore interactive web-based programs that specialize in helping you quit. GET MEDICINE AND USE IT CORRECTLY Medicines can help you stop smoking and decrease the urge to smoke. Combining medicine with the above behavioral methods and support can greatly increase your chances of successfully quitting smoking.  Nicotine replacement therapy helps deliver nicotine to your body without the negative effects and risks of smoking. Nicotine replacement therapy includes nicotine gum, lozenges, inhalers, nasal sprays, and skin patches. Some may be available over-the-counter and others require a prescription.  Antidepressant medicine helps people abstain from smoking, but how this works is unknown. This medicine is available by prescription.  Nicotinic receptor partial agonist medicine simulates the effect of nicotine in your brain. This medicine is available by prescription. Ask your caregiver for advice about which medicines to use and how to use them based on your health history. Your caregiver will tell you what side effects to look out for if you choose to be on a medicine or therapy. Carefully read the information on the package. Do not use any other product containing nicotine while using a nicotine replacement product.  RELAPSE OR DIFFICULT SITUATIONS Most relapses occur within the first 3 months after quitting. Do not be discouraged if you start smoking again. Remember, most people try several times before finally  quitting. You may have symptoms of withdrawal because your body is used to nicotine. You may crave cigarettes, be irritable, feel very hungry, cough often, get headaches, or have difficulty concentrating. The withdrawal symptoms are only temporary. They are strongest when you first quit, but they will go away within 10 14 days. To reduce the chances of relapse, try to:  Avoid drinking alcohol. Drinking lowers your chances of successfully quitting.  Reduce the amount of caffeine you consume. Once you quit smoking, the amount of caffeine in your body increases and can give you symptoms, such as a rapid heartbeat, sweating, and anxiety.  Avoid smokers because they can make you want to smoke.  Do not let weight gain distract you. Many smokers will gain weight when they quit, usually less than 10 pounds. Eat a healthy diet and stay active. You can always lose the weight gained after you quit.  Find ways to improve your mood other than smoking. FOR MORE INFORMATION  www.[employer].[place]  Document Released: 12/30/2000 Document Revised: 07/07/2011 Document Reviewed: 04/16/2011 [facility] Patient Information 2014 [place], [place].

## 2013-02-16 NOTE — Assessment & Plan Note (Signed)
Follows with [facility] urology as has CT urogram sche 03/08/13

## 2013-02-16 NOTE — Assessment & Plan Note (Signed)
BP Readings from Last 3 Encounters:  02/16/13 141/93  02/02/13 137/89  01/30/13 147/67    Lab Results  Component Value Date   NA 134* 01/29/2013   K 3.9 01/29/2013   CREATININE 1.08 01/29/2013    Assessment: Blood pressure control: mildly elevated Progress toward BP goal:  at goal Comments: none  Plan: Medications:  continue current medications Educational resources provided: brochure Self management tools provided: other (see comments) Other plans: none today f/u in 3-4 months

## 2013-02-18 NOTE — Progress Notes (Signed)
Case discussed with Dr. McLean soon after the resident saw the patient.  We reviewed the resident's history and exam and pertinent patient test results.  I agree with the assessment, diagnosis, and plan of care documented in the resident's note. 

## 2013-03-03 ENCOUNTER — Encounter (HOSPITAL_COMMUNITY): Payer: Self-pay | Admitting: Emergency Medicine

## 2013-03-03 ENCOUNTER — Emergency Department (HOSPITAL_COMMUNITY)
Admission: EM | Admit: 2013-03-03 | Discharge: 2013-03-03 | Disposition: A | Discharge status: Home / Self Care | Payer: No Typology Code available for payment source | Attending: Emergency Medicine | Admitting: Emergency Medicine

## 2013-03-03 DIAGNOSIS — I129 Hypertensive chronic kidney disease with stage 1 through stage 4 chronic kidney disease, or unspecified chronic kidney disease: Secondary | ICD-10-CM | POA: Insufficient documentation

## 2013-03-03 DIAGNOSIS — Z9889 Other specified postprocedural states: Secondary | ICD-10-CM | POA: Insufficient documentation

## 2013-03-03 DIAGNOSIS — N189 Chronic kidney disease, unspecified: Secondary | ICD-10-CM | POA: Insufficient documentation

## 2013-03-03 DIAGNOSIS — G43909 Migraine, unspecified, not intractable, without status migrainosus: Secondary | ICD-10-CM | POA: Insufficient documentation

## 2013-03-03 DIAGNOSIS — G8929 Other chronic pain: Secondary | ICD-10-CM | POA: Insufficient documentation

## 2013-03-03 DIAGNOSIS — K219 Gastro-esophageal reflux disease without esophagitis: Secondary | ICD-10-CM | POA: Insufficient documentation

## 2013-03-03 DIAGNOSIS — F17200 Nicotine dependence, unspecified, uncomplicated: Secondary | ICD-10-CM | POA: Insufficient documentation

## 2013-03-03 DIAGNOSIS — R1013 Epigastric pain: Secondary | ICD-10-CM | POA: Insufficient documentation

## 2013-03-03 DIAGNOSIS — Z8619 Personal history of other infectious and parasitic diseases: Secondary | ICD-10-CM | POA: Insufficient documentation

## 2013-03-03 DIAGNOSIS — J45909 Unspecified asthma, uncomplicated: Secondary | ICD-10-CM | POA: Insufficient documentation

## 2013-03-03 DIAGNOSIS — R110 Nausea: Secondary | ICD-10-CM | POA: Insufficient documentation

## 2013-03-03 DIAGNOSIS — R109 Unspecified abdominal pain: Secondary | ICD-10-CM

## 2013-03-03 DIAGNOSIS — Z79899 Other long term (current) drug therapy: Secondary | ICD-10-CM | POA: Insufficient documentation

## 2013-03-03 DIAGNOSIS — R319 Hematuria, unspecified: Secondary | ICD-10-CM | POA: Insufficient documentation

## 2013-03-03 LAB — URINALYSIS, ROUTINE W REFLEX MICROSCOPIC
Bilirubin Urine: NEGATIVE
Glucose, UA: NEGATIVE mg/dL
Ketones, ur: NEGATIVE mg/dL
Leukocytes, UA: NEGATIVE
NITRITE: NEGATIVE
PROTEIN: NEGATIVE mg/dL
SPECIFIC GRAVITY, URINE: 1.009 (ref 1.005–1.030)
Urobilinogen, UA: 1 mg/dL (ref 0.0–1.0)
pH: 7 (ref 5.0–8.0)

## 2013-03-03 LAB — CBC WITH DIFFERENTIAL/PLATELET
Basophils Absolute: 0 10*3/uL (ref 0.0–0.1)
Basophils Relative: 0 % (ref 0–1)
EOS ABS: 0.3 10*3/uL (ref 0.0–0.7)
EOS PCT: 4 % (ref 0–5)
HCT: 45.6 % (ref 39.0–52.0)
Hemoglobin: 16 g/dL (ref 13.0–17.0)
Lymphocytes Relative: 61 % — ABNORMAL HIGH (ref 12–46)
Lymphs Abs: 4.7 10*3/uL — ABNORMAL HIGH (ref 0.7–4.0)
MCH: 31.3 pg (ref 26.0–34.0)
MCHC: 35.1 g/dL (ref 30.0–36.0)
MCV: 89.2 fL (ref 78.0–100.0)
MONOS PCT: 6 % (ref 3–12)
Monocytes Absolute: 0.5 10*3/uL (ref 0.1–1.0)
Neutro Abs: 2.3 10*3/uL (ref 1.7–7.7)
Neutrophils Relative %: 29 % — ABNORMAL LOW (ref 43–77)
PLATELETS: 271 10*3/uL (ref 150–400)
RBC: 5.11 MIL/uL (ref 4.22–5.81)
RDW: 12.7 % (ref 11.5–15.5)
WBC: 7.8 10*3/uL (ref 4.0–10.5)

## 2013-03-03 LAB — COMPREHENSIVE METABOLIC PANEL
ALT: 45 U/L (ref 0–53)
AST: 28 U/L (ref 0–37)
Albumin: 4.1 g/dL (ref 3.5–5.2)
Alkaline Phosphatase: 111 U/L (ref 39–117)
BUN: 12 mg/dL (ref 6–23)
CALCIUM: 10 mg/dL (ref 8.4–10.5)
CO2: 27 meq/L (ref 19–32)
CREATININE: 0.96 mg/dL (ref 0.50–1.35)
Chloride: 97 mEq/L (ref 96–112)
GLUCOSE: 112 mg/dL — AB (ref 70–99)
Potassium: 3.5 mEq/L — ABNORMAL LOW (ref 3.7–5.3)
SODIUM: 137 meq/L (ref 137–147)
TOTAL PROTEIN: 7.6 g/dL (ref 6.0–8.3)
Total Bilirubin: 0.7 mg/dL (ref 0.3–1.2)

## 2013-03-03 LAB — URINE MICROSCOPIC-ADD ON

## 2013-03-03 LAB — CG4 I-STAT (LACTIC ACID): Lactic Acid, Venous: 0.7 mmol/L (ref 0.5–2.2)

## 2013-03-03 LAB — LIPASE, BLOOD: Lipase: 67 U/L — ABNORMAL HIGH (ref 11–59)

## 2013-03-03 MED ORDER — OXYCODONE-ACETAMINOPHEN 5-325 MG PO TABS
1.0000 | ORAL_TABLET | Freq: Once | ORAL | Status: AC
  Administered 2013-03-03: 1 via ORAL
  Filled 2013-03-03: qty 1

## 2013-03-03 MED ORDER — SODIUM CHLORIDE 0.9 % IV BOLUS (SEPSIS)
1000.0000 mL | Freq: Once | INTRAVENOUS | Status: AC
Start: 2013-03-03 — End: 2013-03-03
  Administered 2013-03-03: 1000 mL via INTRAVENOUS

## 2013-03-03 MED ORDER — ONDANSETRON HCL 4 MG/2ML IJ SOLN
4.0000 mg | Freq: Once | INTRAMUSCULAR | Status: AC
  Administered 2013-03-03: 4 mg via INTRAVENOUS
  Filled 2013-03-03: qty 2

## 2013-03-03 MED ORDER — OXYCODONE-ACETAMINOPHEN 5-325 MG PO TABS: 1.0000 | ORAL_TABLET | ORAL | Status: DC | PRN

## 2013-03-03 MED ORDER — OXYCODONE-ACETAMINOPHEN 5-325 MG PO TABS: 2.0000 | ORAL_TABLET | ORAL | Status: DC | PRN

## 2013-03-03 MED ORDER — HYDROMORPHONE HCL PF 1 MG/ML IJ SOLN
1.0000 mg | Freq: Once | INTRAMUSCULAR | Status: AC
  Administered 2013-03-03: 1 mg via INTRAVENOUS
  Filled 2013-03-03: qty 1

## 2013-03-03 MED ORDER — HYDROMORPHONE HCL 2 MG PO TABS
2.0000 mg | ORAL_TABLET | Freq: Once | ORAL | Status: AC
  Administered 2013-03-03: 2 mg via ORAL
  Filled 2013-03-03: qty 1
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
## 2013-03-03 NOTE — Discharge Instructions (Signed)
Our records indicate that you have history of chronic abdominal pain. Recent imaging is normal, all the labs here are normal. Please see your primary care doctor for pain control. Our records show that someone is prescribing you narcotic pain meds, and you must continue seeing the same provider for narcotic pain meds.  Chronic Pain Chronic pain can be defined as pain that is off and on and lasts for 3 6 months or longer. Many things cause chronic pain, which can make it difficult to make a diagnosis. There are many treatment options available for chronic pain. However, finding a treatment that works well for you may require trying various approaches until the right one is found. Many people benefit from a combination of two or more types of treatment to control their pain. SYMPTOMS  Chronic pain can occur anywhere in the body and can range from mild to very severe. Some types of chronic pain include:  Headache.  Low back pain.  Cancer pain.  Arthritis pain.  Neurogenic pain. This is pain resulting from damage to nerves. People with chronic pain may also have other symptoms such as:  Depression.  Anger.  Insomnia.  Anxiety. DIAGNOSIS  Your health care provider will help diagnose your condition over time. In many cases, the initial focus will be on excluding possible conditions that could be causing the pain. Depending on your symptoms, your health care provider may order tests to diagnose your condition. Some of these tests may include:   Blood tests.   CT scan.   MRI.   X-rays.   Ultrasounds.   Nerve conduction studies.  You may need to see a specialist.  TREATMENT  Finding treatment that works well may take time. You may be referred to a pain specialist. He or she may prescribe medicine or therapies, such as:   Mindful meditation or yoga.  Shots (injections) of numbing or pain-relieving medicines into the spine or area of pain.  Local electrical  stimulation.  Acupuncture.   Massage therapy.   Aroma, color, light, or sound therapy.   Biofeedback.   Working with a physical therapist to keep from getting stiff.   Regular, gentle exercise.   Cognitive or behavioral therapy.   Group support.  Sometimes, surgery may be recommended.  HOME CARE INSTRUCTIONS   Take all medicines as directed by your health care provider.   Lessen stress in your life by relaxing and doing things such as listening to calming music.   Exercise or be active as directed by your health care provider.   Eat a healthy diet and include things such as vegetables, fruits, fish, and lean meats in your diet.   Keep all follow-up appointments with your health care provider.   Attend a support group with others suffering from chronic pain. SEEK MEDICAL CARE IF:   Your pain gets worse.   You develop a new pain that was not there before.   You cannot tolerate medicines given to you by your health care provider.   You have new symptoms since your last visit with your health care provider.  SEEK IMMEDIATE MEDICAL CARE IF:   You feel weak.   You have decreased sensation or numbness.   You lose control of bowel or bladder function.   Your pain suddenly gets much worse.   You develop shaking.  You develop chills.  You develop confusion.  You develop chest pain.  You develop shortness of breath.  MAKE SURE YOU:  Understand these instructions.  Will watch your condition.  Will get help right away if you are not doing well or get worse. Document Released: 09/27/2001 Document Revised: 09/07/2012 Document Reviewed: 07/01/2012 [facility] Patient Information 2014 [place].

## 2013-03-03 NOTE — ED Notes (Addendum)
Pt presents with bilateral flank pain and right sided abdominal pain. Pt has a hx of pancreatitis and has chronic pain. Pt says that the pain started to get worse around 12:15 this morning. Pt says he does not currently see any blood in his urine but he did have this problem a few weeks ago. Pt says he has had 2 episodes of vomiting since the pain began. Pt says he has also been experiencing some pain shooting into his groin area.

## 2013-03-06 ENCOUNTER — Emergency Department (HOSPITAL_COMMUNITY)
Admission: EM | Admit: 2013-03-06 | Discharge: 2013-03-07 | Disposition: A | Discharge status: Home / Self Care | Payer: No Typology Code available for payment source | Attending: Emergency Medicine | Admitting: Emergency Medicine

## 2013-03-06 ENCOUNTER — Encounter (HOSPITAL_COMMUNITY): Payer: Self-pay | Admitting: Emergency Medicine

## 2013-03-06 DIAGNOSIS — J45909 Unspecified asthma, uncomplicated: Secondary | ICD-10-CM | POA: Insufficient documentation

## 2013-03-06 DIAGNOSIS — F17200 Nicotine dependence, unspecified, uncomplicated: Secondary | ICD-10-CM | POA: Insufficient documentation

## 2013-03-06 DIAGNOSIS — Z8619 Personal history of other infectious and parasitic diseases: Secondary | ICD-10-CM | POA: Insufficient documentation

## 2013-03-06 DIAGNOSIS — I129 Hypertensive chronic kidney disease with stage 1 through stage 4 chronic kidney disease, or unspecified chronic kidney disease: Secondary | ICD-10-CM | POA: Insufficient documentation

## 2013-03-06 DIAGNOSIS — Z8719 Personal history of other diseases of the digestive system: Secondary | ICD-10-CM | POA: Insufficient documentation

## 2013-03-06 DIAGNOSIS — Z79899 Other long term (current) drug therapy: Secondary | ICD-10-CM | POA: Insufficient documentation

## 2013-03-06 DIAGNOSIS — Z9889 Other specified postprocedural states: Secondary | ICD-10-CM | POA: Insufficient documentation

## 2013-03-06 DIAGNOSIS — R1012 Left upper quadrant pain: Secondary | ICD-10-CM | POA: Insufficient documentation

## 2013-03-06 DIAGNOSIS — Z792 Long term (current) use of antibiotics: Secondary | ICD-10-CM | POA: Insufficient documentation

## 2013-03-06 DIAGNOSIS — R109 Unspecified abdominal pain: Secondary | ICD-10-CM

## 2013-03-06 DIAGNOSIS — I209 Angina pectoris, unspecified: Secondary | ICD-10-CM | POA: Insufficient documentation

## 2013-03-06 DIAGNOSIS — R112 Nausea with vomiting, unspecified: Secondary | ICD-10-CM

## 2013-03-06 DIAGNOSIS — Z862 Personal history of diseases of the blood and blood-forming organs and certain disorders involving the immune mechanism: Secondary | ICD-10-CM | POA: Insufficient documentation

## 2013-03-06 DIAGNOSIS — Z8639 Personal history of other endocrine, nutritional and metabolic disease: Secondary | ICD-10-CM | POA: Insufficient documentation

## 2013-03-06 DIAGNOSIS — G43909 Migraine, unspecified, not intractable, without status migrainosus: Secondary | ICD-10-CM | POA: Insufficient documentation

## 2013-03-06 DIAGNOSIS — K219 Gastro-esophageal reflux disease without esophagitis: Secondary | ICD-10-CM | POA: Insufficient documentation

## 2013-03-06 DIAGNOSIS — Z87448 Personal history of other diseases of urinary system: Secondary | ICD-10-CM | POA: Insufficient documentation

## 2013-03-06 DIAGNOSIS — N189 Chronic kidney disease, unspecified: Secondary | ICD-10-CM | POA: Insufficient documentation

## 2013-03-06 DIAGNOSIS — G8929 Other chronic pain: Secondary | ICD-10-CM | POA: Insufficient documentation

## 2013-03-06 NOTE — ED Notes (Signed)
Patient is alert and oriented x3.  He is complaining of pancreatitis that he has been for at [facility] today. He states that he has multiple issues that he is being seen for but was told that is his pain gets too bad That he is to come to the ED.  Currently he rates his pain 10 of 10 with nausea and vomiting.

## 2013-03-06 NOTE — ED Notes (Signed)
MD at bedside. 

## 2013-03-07 LAB — URINE MICROSCOPIC-ADD ON

## 2013-03-07 LAB — CBC WITH DIFFERENTIAL/PLATELET
BASOS ABS: 0 10*3/uL (ref 0.0–0.1)
BASOS PCT: 1 % (ref 0–1)
EOS PCT: 2 % (ref 0–5)
Eosinophils Absolute: 0.2 10*3/uL (ref 0.0–0.7)
HEMATOCRIT: 41.6 % (ref 39.0–52.0)
Hemoglobin: 14.3 g/dL (ref 13.0–17.0)
Lymphocytes Relative: 60 % — ABNORMAL HIGH (ref 12–46)
Lymphs Abs: 3.7 10*3/uL (ref 0.7–4.0)
MCH: 30.9 pg (ref 26.0–34.0)
MCHC: 34.4 g/dL (ref 30.0–36.0)
MCV: 89.8 fL (ref 78.0–100.0)
MONO ABS: 0.5 10*3/uL (ref 0.1–1.0)
Monocytes Relative: 8 % (ref 3–12)
Neutro Abs: 1.9 10*3/uL (ref 1.7–7.7)
Neutrophils Relative %: 30 % — ABNORMAL LOW (ref 43–77)
PLATELETS: 245 10*3/uL (ref 150–400)
RBC: 4.63 MIL/uL (ref 4.22–5.81)
RDW: 12.9 % (ref 11.5–15.5)
WBC: 6.3 10*3/uL (ref 4.0–10.5)

## 2013-03-07 LAB — URINALYSIS, ROUTINE W REFLEX MICROSCOPIC
Bilirubin Urine: NEGATIVE
GLUCOSE, UA: NEGATIVE mg/dL
HGB URINE DIPSTICK: NEGATIVE
KETONES UR: NEGATIVE mg/dL
Leukocytes, UA: NEGATIVE
Nitrite: NEGATIVE
PROTEIN: 30 mg/dL — AB
Specific Gravity, Urine: 1.016 (ref 1.005–1.030)
UROBILINOGEN UA: 1 mg/dL (ref 0.0–1.0)
pH: 8 (ref 5.0–8.0)

## 2013-03-07 LAB — ETHANOL: Alcohol, Ethyl (B): <11 mg/dL (ref 0–11)

## 2013-03-07 LAB — COMPREHENSIVE METABOLIC PANEL
ALBUMIN: 3.5 g/dL (ref 3.5–5.2)
ALT: 39 U/L (ref 0–53)
AST: 23 U/L (ref 0–37)
Alkaline Phosphatase: 103 U/L (ref 39–117)
BUN: 17 mg/dL (ref 6–23)
CALCIUM: 9.2 mg/dL (ref 8.4–10.5)
CO2: 25 mEq/L (ref 19–32)
CREATININE: 1.19 mg/dL (ref 0.50–1.35)
Chloride: 100 mEq/L (ref 96–112)
GFR calc Af Amer: 78 mL/min — ABNORMAL LOW (ref >90)
GFR calc non Af Amer: 67 mL/min — ABNORMAL LOW (ref >90)
Glucose, Bld: 128 mg/dL — ABNORMAL HIGH (ref 70–99)
Potassium: 3.8 mEq/L (ref 3.7–5.3)
Sodium: 137 mEq/L (ref 137–147)
TOTAL PROTEIN: 6.8 g/dL (ref 6.0–8.3)
Total Bilirubin: 0.5 mg/dL (ref 0.3–1.2)

## 2013-03-07 LAB — RAPID URINE DRUG SCREEN, HOSP PERFORMED
Amphetamines: NOT DETECTED
BENZODIAZEPINES: NOT DETECTED
Barbiturates: NOT DETECTED
Cocaine: NOT DETECTED
Opiates: NOT DETECTED
TETRAHYDROCANNABINOL: NOT DETECTED

## 2013-03-07 LAB — LIPASE, BLOOD: Lipase: 96 U/L — ABNORMAL HIGH (ref 11–59)

## 2013-03-07 MED ORDER — SODIUM CHLORIDE 0.9 % IV SOLN: 1000.0000 mL | INTRAVENOUS | Status: DC

## 2013-03-07 MED ORDER — SODIUM CHLORIDE 0.9 % IV SOLN
1000.0000 mL | Freq: Once | INTRAVENOUS | Status: AC
  Administered 2013-03-07: 1000 mL via INTRAVENOUS

## 2013-03-07 MED ORDER — DIAZEPAM 5 MG/ML IJ SOLN
5.0000 mg | Freq: Once | INTRAMUSCULAR | Status: AC
  Administered 2013-03-07: 5 mg via INTRAVENOUS
  Filled 2013-03-07: qty 2

## 2013-03-07 MED ORDER — METOCLOPRAMIDE HCL 5 MG/ML IJ SOLN
10.0000 mg | Freq: Once | INTRAMUSCULAR | Status: AC
  Administered 2013-03-07: 10 mg via INTRAVENOUS
  Filled 2013-03-07: qty 2

## 2013-03-07 MED ORDER — PROMETHAZINE HCL 25 MG PO TABS: 25.0000 mg | ORAL_TABLET | Freq: Four times a day (QID) | ORAL | Status: DC | PRN
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
## 2013-03-07 NOTE — Discharge Instructions (Signed)
You will need to see your primary care doctor or your pain management doctor to get your pain medications. Use the phenergan for nausea or vomiting.   Chronic Pain Discharge Instructions  Emergency care providers appreciate that many patients coming to [place] are in severe pain and we wish to address their pain in the safest, most responsible manner.  It is important to recognize however, that the proper treatment of chronic pain differs from that of the pain of injuries and acute illnesses.  Our goal is to provide quality, safe, personalized care and we thank you for giving [place] the opportunity to serve you. The use of narcotics and related agents for chronic pain syndromes may lead to additional physical and psychological problems.  Nearly as many people die from prescription narcotics each year as die from car crashes.  Additionally, this risk is increased if such prescriptions are obtained from a variety of sources.  Therefore, only your primary care physician or a pain management specialist is able to safely treat such syndromes with narcotic medications long-term.    Documentation revealing such prescriptions have been sought from multiple sources may prohibit [place] from providing a refill or different narcotic medication.  Your name may be checked first through the [place].  This database is a record of controlled substance medication prescriptions that the patient has received.  This has been established by [facility] in an effort to eliminate the dangerous, and often life threatening, practice of obtaining multiple prescriptions from different medical providers.   If you have a chronic pain syndrome (i.e. chronic headaches, recurrent back or neck pain, dental pain, abdominal or pelvis pain without a specific diagnosis, or neuropathic pain such as fibromyalgia) or recurrent visits for the same condition without an acute diagnosis, you may be treated with  non-narcotics and other non-addictive medicines.  Allergic reactions or negative side effects that may be reported by a patient to such medications will not typically lead to the use of a narcotic analgesic or other controlled substance as an alternative.   Patients managing chronic pain with a personal physician should have provisions in place for breakthrough pain.  If you are in crisis, you should call your physician.  If your physician directs you to the emergency department, please have the doctor call and speak to our attending physician concerning your care.   When patients come to the Emergency Department (ED) with acute medical conditions in which the Emergency Department physician feels appropriate to prescribe narcotic or sedating pain medication, the physician will prescribe these in very limited quantities.  The amount of these medications will last only until you can see your primary care physician in his/her office.  Any patient who returns to the ED seeking refills should expect only non-narcotic pain medications.   In the event of an acute medical condition exists and the emergency physician feels it is necessary that the patient be given a narcotic or sedating medication -  a responsible adult driver should be present in the room prior to the medication being given by the nurse.   Prescriptions for narcotic or sedating medications that have been lost, stolen or expired will not be refilled in the Emergency Department.    Patients who have chronic pain may receive non-narcotic prescriptions until seen by their primary care physician.  It is every patients personal responsibility to maintain active prescriptions with his or her primary care physician or specialist.

## 2013-03-07 NOTE — ED Notes (Addendum)
While Dr. Tomi Bamberger in room, patient could be heard yelling and arguing with MD This nurse and security staff at door of room where patient continued to yell at MD Patient upset that he is not getting narcotic pain medication and states that we are "refusing to treat him." Patient continued to argue with MD, not letting her speak, talking over her, so MD left room Patient then pressed call bell, to which this nurse and ED tech entered room with security staff and [employer] officer standing outside open door Patient immediately began arguing with this nurse I attempted multiple times to speak, to which patient refused to let me talk by interrupting me, talking over me and raising his voice I ended up leaving the room due to patient behavior MD and Delsa Sale, [place] charge nurse aware of patient's behavior

## 2013-03-07 NOTE — ED Notes (Signed)
Entered patient room and informed him of orders, specifically a need for urine specimen Patient asked this nurse "What do you want from me?" Patient again informed of MD orders Patient began to argue with this nurse, so I immediately left the room and informed the charge nurse of what took place Pana, RN to assume care of this patient

## 2013-03-08 ENCOUNTER — Telehealth: Payer: Self-pay | Admitting: *Deleted

## 2013-03-08 ENCOUNTER — Encounter (HOSPITAL_COMMUNITY): Payer: Self-pay | Admitting: Emergency Medicine

## 2013-03-08 ENCOUNTER — Emergency Department (HOSPITAL_COMMUNITY)
Admission: EM | Admit: 2013-03-08 | Discharge: 2013-03-08 | Disposition: A | Discharge status: Home / Self Care | Payer: No Typology Code available for payment source | Attending: Emergency Medicine | Admitting: Emergency Medicine

## 2013-03-08 DIAGNOSIS — F17200 Nicotine dependence, unspecified, uncomplicated: Secondary | ICD-10-CM | POA: Insufficient documentation

## 2013-03-08 DIAGNOSIS — Z79899 Other long term (current) drug therapy: Secondary | ICD-10-CM | POA: Insufficient documentation

## 2013-03-08 DIAGNOSIS — R112 Nausea with vomiting, unspecified: Secondary | ICD-10-CM | POA: Insufficient documentation

## 2013-03-08 DIAGNOSIS — I129 Hypertensive chronic kidney disease with stage 1 through stage 4 chronic kidney disease, or unspecified chronic kidney disease: Secondary | ICD-10-CM | POA: Insufficient documentation

## 2013-03-08 DIAGNOSIS — Z8619 Personal history of other infectious and parasitic diseases: Secondary | ICD-10-CM | POA: Insufficient documentation

## 2013-03-08 DIAGNOSIS — J45909 Unspecified asthma, uncomplicated: Secondary | ICD-10-CM | POA: Insufficient documentation

## 2013-03-08 DIAGNOSIS — Z862 Personal history of diseases of the blood and blood-forming organs and certain disorders involving the immune mechanism: Secondary | ICD-10-CM | POA: Insufficient documentation

## 2013-03-08 DIAGNOSIS — Z8719 Personal history of other diseases of the digestive system: Secondary | ICD-10-CM | POA: Insufficient documentation

## 2013-03-08 DIAGNOSIS — R109 Unspecified abdominal pain: Secondary | ICD-10-CM

## 2013-03-08 DIAGNOSIS — I209 Angina pectoris, unspecified: Secondary | ICD-10-CM | POA: Insufficient documentation

## 2013-03-08 DIAGNOSIS — R1084 Generalized abdominal pain: Secondary | ICD-10-CM | POA: Insufficient documentation

## 2013-03-08 DIAGNOSIS — G43909 Migraine, unspecified, not intractable, without status migrainosus: Secondary | ICD-10-CM | POA: Insufficient documentation

## 2013-03-08 DIAGNOSIS — G8929 Other chronic pain: Secondary | ICD-10-CM | POA: Insufficient documentation

## 2013-03-08 DIAGNOSIS — Z87828 Personal history of other (healed) physical injury and trauma: Secondary | ICD-10-CM | POA: Insufficient documentation

## 2013-03-08 DIAGNOSIS — Z8711 Personal history of peptic ulcer disease: Secondary | ICD-10-CM | POA: Insufficient documentation

## 2013-03-08 DIAGNOSIS — Z8639 Personal history of other endocrine, nutritional and metabolic disease: Secondary | ICD-10-CM | POA: Insufficient documentation

## 2013-03-08 DIAGNOSIS — Z9889 Other specified postprocedural states: Secondary | ICD-10-CM | POA: Insufficient documentation

## 2013-03-08 DIAGNOSIS — N189 Chronic kidney disease, unspecified: Secondary | ICD-10-CM | POA: Insufficient documentation

## 2013-03-08 DIAGNOSIS — Z87448 Personal history of other diseases of urinary system: Secondary | ICD-10-CM | POA: Insufficient documentation

## 2013-03-08 LAB — CBC WITH DIFFERENTIAL/PLATELET
Basophils Absolute: 0 10*3/uL (ref 0.0–0.1)
Basophils Relative: 0 % (ref 0–1)
Eosinophils Absolute: 0.1 10*3/uL (ref 0.0–0.7)
Eosinophils Relative: 1 % (ref 0–5)
HCT: 44.4 % (ref 39.0–52.0)
Hemoglobin: 15.7 g/dL (ref 13.0–17.0)
LYMPHS PCT: 51 % — AB (ref 12–46)
Lymphs Abs: 4 10*3/uL (ref 0.7–4.0)
MCH: 32 pg (ref 26.0–34.0)
MCHC: 35.4 g/dL (ref 30.0–36.0)
MCV: 90.4 fL (ref 78.0–100.0)
Monocytes Absolute: 0.6 10*3/uL (ref 0.1–1.0)
Monocytes Relative: 8 % (ref 3–12)
NEUTROS ABS: 3.1 10*3/uL (ref 1.7–7.7)
Neutrophils Relative %: 39 % — ABNORMAL LOW (ref 43–77)
PLATELETS: 272 10*3/uL (ref 150–400)
RBC: 4.91 MIL/uL (ref 4.22–5.81)
RDW: 13.1 % (ref 11.5–15.5)
WBC: 7.8 10*3/uL (ref 4.0–10.5)

## 2013-03-08 LAB — LIPASE, BLOOD: Lipase: 76 U/L — ABNORMAL HIGH (ref 11–59)

## 2013-03-08 LAB — URINALYSIS, ROUTINE W REFLEX MICROSCOPIC
BILIRUBIN URINE: NEGATIVE
Glucose, UA: NEGATIVE mg/dL
Ketones, ur: NEGATIVE mg/dL
Leukocytes, UA: NEGATIVE
Nitrite: NEGATIVE
Protein, ur: 100 mg/dL — AB
SPECIFIC GRAVITY, URINE: 1.025 (ref 1.005–1.030)
Urobilinogen, UA: 1 mg/dL (ref 0.0–1.0)
pH: 5.5 (ref 5.0–8.0)

## 2013-03-08 LAB — COMPREHENSIVE METABOLIC PANEL
ALK PHOS: 106 U/L (ref 39–117)
ALT: 43 U/L (ref 0–53)
AST: 27 U/L (ref 0–37)
Albumin: 3.8 g/dL (ref 3.5–5.2)
BUN: 9 mg/dL (ref 6–23)
CHLORIDE: 103 meq/L (ref 96–112)
CO2: 26 mEq/L (ref 19–32)
Calcium: 9.6 mg/dL (ref 8.4–10.5)
Creatinine, Ser: 0.99 mg/dL (ref 0.50–1.35)
GFR calc Af Amer: >90 mL/min (ref >90)
Glucose, Bld: 116 mg/dL — ABNORMAL HIGH (ref 70–99)
POTASSIUM: 4 meq/L (ref 3.7–5.3)
SODIUM: 141 meq/L (ref 137–147)
TOTAL PROTEIN: 7.5 g/dL (ref 6.0–8.3)
Total Bilirubin: 0.7 mg/dL (ref 0.3–1.2)

## 2013-03-08 LAB — URINE MICROSCOPIC-ADD ON

## 2013-03-08 MED ORDER — OXYCODONE HCL 5 MG PO TABS
10.0000 mg | ORAL_TABLET | Freq: Once | ORAL | Status: AC
  Administered 2013-03-08: 10 mg via ORAL
  Filled 2013-03-08: qty 2

## 2013-03-08 MED ORDER — ONDANSETRON 4 MG PO TBDP
8.0000 mg | ORAL_TABLET | Freq: Once | ORAL | Status: AC
  Administered 2013-03-08: 8 mg via ORAL
  Filled 2013-03-08: qty 2

## 2013-03-08 NOTE — ED Notes (Signed)
Pt reporting pancreatitis flare up for a couple of days. Vomiting x 2 today. Reports pain has gotten worse, has an appointment with chronic pain clinic. States i usually get an IV and a shot of dilaudid and the pain improves. States today he took 2 tylenols.

## 2013-03-08 NOTE — ED Notes (Signed)
Pt is not actively vomiting reports he already took nausea medication.

## 2013-03-08 NOTE — Telephone Encounter (Signed)
Message copied by Ebbie Latus on Wed Mar 08, 2013  9:29 AM ------      Message from: Cresenciano Genre      Created: Tue Mar 07, 2013  7:15 AM       Please call patient and let him know to follow up with [facility] or surgery             Thanks      Karlyn Agee  ------

## 2013-03-08 NOTE — Telephone Encounter (Signed)
Pt states he has already f/u at [facility]; has an appt w/GI doctor this month. Also at the [facility] - appt on the 25th.

## 2013-03-08 NOTE — Discharge Instructions (Signed)
°  SEEK IMMEDIATE MEDICAL ATTENTION IF: °The pain does not go away or becomes severe, particularly over the next 8-12 hours.  °A temperature above 100.4F develops.  °Repeated vomiting occurs (multiple episodes).  °The pain becomes localized to portions of the abdomen. The right side could possibly be appendicitis. In an adult, the left lower portion of the abdomen could be colitis or diverticulitis.  °Blood is being passed in stools or vomit (bright red or black tarry stools).  °Return also if you develop chest pain, difficulty breathing, dizziness or fainting, or become confused, poorly responsive ° °
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
## 2013-03-12 ENCOUNTER — Emergency Department (HOSPITAL_COMMUNITY)
Admission: EM | Admit: 2013-03-12 | Discharge: 2013-03-12 | Disposition: A | Discharge status: Home / Self Care | Payer: No Typology Code available for payment source | Attending: Emergency Medicine | Admitting: Emergency Medicine

## 2013-03-12 ENCOUNTER — Encounter (HOSPITAL_COMMUNITY): Payer: Self-pay | Admitting: Emergency Medicine

## 2013-03-12 DIAGNOSIS — Z8719 Personal history of other diseases of the digestive system: Secondary | ICD-10-CM | POA: Insufficient documentation

## 2013-03-12 DIAGNOSIS — Z8619 Personal history of other infectious and parasitic diseases: Secondary | ICD-10-CM | POA: Insufficient documentation

## 2013-03-12 DIAGNOSIS — Z862 Personal history of diseases of the blood and blood-forming organs and certain disorders involving the immune mechanism: Secondary | ICD-10-CM | POA: Insufficient documentation

## 2013-03-12 DIAGNOSIS — Z8711 Personal history of peptic ulcer disease: Secondary | ICD-10-CM | POA: Insufficient documentation

## 2013-03-12 DIAGNOSIS — R1033 Periumbilical pain: Secondary | ICD-10-CM | POA: Insufficient documentation

## 2013-03-12 DIAGNOSIS — I209 Angina pectoris, unspecified: Secondary | ICD-10-CM | POA: Insufficient documentation

## 2013-03-12 DIAGNOSIS — Z79899 Other long term (current) drug therapy: Secondary | ICD-10-CM | POA: Insufficient documentation

## 2013-03-12 DIAGNOSIS — N189 Chronic kidney disease, unspecified: Secondary | ICD-10-CM | POA: Insufficient documentation

## 2013-03-12 DIAGNOSIS — J45909 Unspecified asthma, uncomplicated: Secondary | ICD-10-CM | POA: Insufficient documentation

## 2013-03-12 DIAGNOSIS — R112 Nausea with vomiting, unspecified: Secondary | ICD-10-CM | POA: Insufficient documentation

## 2013-03-12 DIAGNOSIS — R109 Unspecified abdominal pain: Secondary | ICD-10-CM

## 2013-03-12 DIAGNOSIS — R319 Hematuria, unspecified: Secondary | ICD-10-CM | POA: Insufficient documentation

## 2013-03-12 DIAGNOSIS — G43909 Migraine, unspecified, not intractable, without status migrainosus: Secondary | ICD-10-CM | POA: Insufficient documentation

## 2013-03-12 DIAGNOSIS — F17200 Nicotine dependence, unspecified, uncomplicated: Secondary | ICD-10-CM | POA: Insufficient documentation

## 2013-03-12 DIAGNOSIS — I129 Hypertensive chronic kidney disease with stage 1 through stage 4 chronic kidney disease, or unspecified chronic kidney disease: Secondary | ICD-10-CM | POA: Insufficient documentation

## 2013-03-12 DIAGNOSIS — Z8639 Personal history of other endocrine, nutritional and metabolic disease: Secondary | ICD-10-CM | POA: Insufficient documentation

## 2013-03-12 DIAGNOSIS — G8929 Other chronic pain: Secondary | ICD-10-CM | POA: Insufficient documentation

## 2013-03-12 DIAGNOSIS — R1013 Epigastric pain: Secondary | ICD-10-CM | POA: Insufficient documentation

## 2013-03-12 DIAGNOSIS — Z9889 Other specified postprocedural states: Secondary | ICD-10-CM | POA: Insufficient documentation

## 2013-03-12 DIAGNOSIS — Z8739 Personal history of other diseases of the musculoskeletal system and connective tissue: Secondary | ICD-10-CM | POA: Insufficient documentation

## 2013-03-12 LAB — COMPREHENSIVE METABOLIC PANEL
ALT: 45 U/L (ref 0–53)
AST: 22 U/L (ref 0–37)
Albumin: 3.8 g/dL (ref 3.5–5.2)
Alkaline Phosphatase: 114 U/L (ref 39–117)
BILIRUBIN TOTAL: 1 mg/dL (ref 0.3–1.2)
BUN: 11 mg/dL (ref 6–23)
CHLORIDE: 97 meq/L (ref 96–112)
CO2: 26 mEq/L (ref 19–32)
Calcium: 9.6 mg/dL (ref 8.4–10.5)
Creatinine, Ser: 1.02 mg/dL (ref 0.50–1.35)
GFR calc Af Amer: >90 mL/min (ref >90)
GFR calc non Af Amer: 81 mL/min — ABNORMAL LOW (ref >90)
Glucose, Bld: 117 mg/dL — ABNORMAL HIGH (ref 70–99)
Potassium: 3.7 mEq/L (ref 3.7–5.3)
SODIUM: 137 meq/L (ref 137–147)
Total Protein: 7.5 g/dL (ref 6.0–8.3)

## 2013-03-12 LAB — CBC WITH DIFFERENTIAL/PLATELET
Basophils Absolute: 0 10*3/uL (ref 0.0–0.1)
Basophils Relative: 0 % (ref 0–1)
Eosinophils Absolute: 0.1 10*3/uL (ref 0.0–0.7)
Eosinophils Relative: 2 % (ref 0–5)
HEMATOCRIT: 45.1 % (ref 39.0–52.0)
Hemoglobin: 16.4 g/dL (ref 13.0–17.0)
LYMPHS PCT: 51 % — AB (ref 12–46)
Lymphs Abs: 2.7 10*3/uL (ref 0.7–4.0)
MCH: 32.3 pg (ref 26.0–34.0)
MCHC: 36.4 g/dL — ABNORMAL HIGH (ref 30.0–36.0)
MCV: 89 fL (ref 78.0–100.0)
MONO ABS: 0.5 10*3/uL (ref 0.1–1.0)
Monocytes Relative: 9 % (ref 3–12)
NEUTROS ABS: 2.1 10*3/uL (ref 1.7–7.7)
NEUTROS PCT: 38 % — AB (ref 43–77)
Platelets: 266 10*3/uL (ref 150–400)
RBC: 5.07 MIL/uL (ref 4.22–5.81)
RDW: 12.8 % (ref 11.5–15.5)
WBC: 5.4 10*3/uL (ref 4.0–10.5)

## 2013-03-12 LAB — LIPASE, BLOOD: Lipase: 50 U/L (ref 11–59)

## 2013-03-12 MED ORDER — OXYCODONE HCL 5 MG PO TABS
10.0000 mg | ORAL_TABLET | Freq: Once | ORAL | Status: AC
  Administered 2013-03-12: 10 mg via ORAL
  Filled 2013-03-12: qty 2

## 2013-03-12 MED ORDER — ONDANSETRON 4 MG PO TBDP
8.0000 mg | ORAL_TABLET | Freq: Once | ORAL | Status: AC
  Administered 2013-03-12: 8 mg via ORAL
  Filled 2013-03-12: qty 2

## 2013-03-12 NOTE — ED Notes (Signed)
Patient given ice water 
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
## 2013-03-12 NOTE — ED Notes (Signed)
Patient discharged to home with family. NAD.  

## 2013-03-12 NOTE — Discharge Instructions (Signed)
Please read and follow all provided instructions.  Your diagnoses today include:  1. Chronic abdominal pain     Tests performed today include:  Blood counts and electrolytes  Blood tests to check liver and kidney function  Blood tests to check pancreas function - normal  Vital signs. See below for your results today.   Medications prescribed:   None  Take any prescribed medications only as directed.  Home care instructions:   Follow any educational materials contained in this packet.  Follow-up instructions: Please follow-up with your primary care provider in the next 7 days for further evaluation of your symptoms.   Please contact your pain management physician as planned.   Return instructions:  SEEK IMMEDIATE MEDICAL ATTENTION IF:  The pain does not go away or becomes severe   A temperature above 101F develops   Repeated vomiting occurs (multiple episodes)   The pain becomes localized to portions of the abdomen. The right side could possibly be appendicitis. In an adult, the left lower portion of the abdomen could be colitis or diverticulitis.   Blood is being passed in stools or vomit (bright red or black tarry stools)   You develop chest pain, difficulty breathing, dizziness or fainting, or become confused, poorly responsive, or inconsolable (young children)  If you have any other emergent concerns regarding your health  Additional Information: Abdominal (belly) pain can be caused by many things. Your caregiver performed an examination and possibly ordered blood/urine tests and imaging (CT scan, x-rays, ultrasound). Many cases can be observed and treated at home after initial evaluation in the emergency department. Even though you are being discharged home, abdominal pain can be unpredictable. Therefore, you need a repeated exam if your pain does not resolve, returns, or worsens. Most patients with abdominal pain don't have to be admitted to the hospital or have  surgery, but serious problems like appendicitis and gallbladder attacks can start out as nonspecific pain. Many abdominal conditions cannot be diagnosed in one visit, so follow-up evaluations are very important.  Your vital signs today were: BP 133/88   Pulse 82   Temp(Src) 98.8 F (37.1 C) (Oral)   Resp 18   Ht 5' 8.5" (1.74 m)   Wt 165 lb 5 oz (74.985 kg)   BMI 24.77 kg/m2   SpO2 100% If your blood pressure (bp) was elevated above 135/85 this visit, please have this repeated by your doctor within one month. --------------

## 2013-03-12 NOTE — ED Notes (Signed)
Pt c/o abdominal pain with n/v onset last night. Pt thinks its his pancreatitis. Pt has appointment with pain clinic on 25th.

## 2013-03-13 NOTE — ED Provider Notes (Signed)
Medical screening examination/treatment/procedure(s) were performed by non-physician practitioner and as supervising physician I was immediately available for consultation/collaboration.  EKG Interpretation   None         Blanchard Kelch, MD 03/13/13 1239

## 2013-03-15 ENCOUNTER — Emergency Department (HOSPITAL_COMMUNITY)
Admission: EM | Admit: 2013-03-15 | Discharge: 2013-03-15 | Disposition: A | Discharge status: Home / Self Care | Payer: No Typology Code available for payment source | Attending: Emergency Medicine | Admitting: Emergency Medicine

## 2013-03-15 ENCOUNTER — Encounter (HOSPITAL_COMMUNITY): Payer: Self-pay | Admitting: Emergency Medicine

## 2013-03-15 DIAGNOSIS — Z862 Personal history of diseases of the blood and blood-forming organs and certain disorders involving the immune mechanism: Secondary | ICD-10-CM | POA: Insufficient documentation

## 2013-03-15 DIAGNOSIS — R195 Other fecal abnormalities: Secondary | ICD-10-CM | POA: Insufficient documentation

## 2013-03-15 DIAGNOSIS — R1013 Epigastric pain: Secondary | ICD-10-CM | POA: Insufficient documentation

## 2013-03-15 DIAGNOSIS — Z8639 Personal history of other endocrine, nutritional and metabolic disease: Secondary | ICD-10-CM | POA: Insufficient documentation

## 2013-03-15 DIAGNOSIS — G8929 Other chronic pain: Secondary | ICD-10-CM | POA: Insufficient documentation

## 2013-03-15 DIAGNOSIS — R1012 Left upper quadrant pain: Secondary | ICD-10-CM | POA: Insufficient documentation

## 2013-03-15 DIAGNOSIS — Z87448 Personal history of other diseases of urinary system: Secondary | ICD-10-CM | POA: Insufficient documentation

## 2013-03-15 DIAGNOSIS — I129 Hypertensive chronic kidney disease with stage 1 through stage 4 chronic kidney disease, or unspecified chronic kidney disease: Secondary | ICD-10-CM | POA: Insufficient documentation

## 2013-03-15 DIAGNOSIS — Z79899 Other long term (current) drug therapy: Secondary | ICD-10-CM | POA: Insufficient documentation

## 2013-03-15 DIAGNOSIS — Z792 Long term (current) use of antibiotics: Secondary | ICD-10-CM | POA: Insufficient documentation

## 2013-03-15 DIAGNOSIS — I209 Angina pectoris, unspecified: Secondary | ICD-10-CM | POA: Insufficient documentation

## 2013-03-15 DIAGNOSIS — Z8619 Personal history of other infectious and parasitic diseases: Secondary | ICD-10-CM | POA: Insufficient documentation

## 2013-03-15 DIAGNOSIS — Z8711 Personal history of peptic ulcer disease: Secondary | ICD-10-CM | POA: Insufficient documentation

## 2013-03-15 DIAGNOSIS — F17200 Nicotine dependence, unspecified, uncomplicated: Secondary | ICD-10-CM | POA: Insufficient documentation

## 2013-03-15 DIAGNOSIS — Z9889 Other specified postprocedural states: Secondary | ICD-10-CM | POA: Insufficient documentation

## 2013-03-15 DIAGNOSIS — G43909 Migraine, unspecified, not intractable, without status migrainosus: Secondary | ICD-10-CM | POA: Insufficient documentation

## 2013-03-15 DIAGNOSIS — J45909 Unspecified asthma, uncomplicated: Secondary | ICD-10-CM | POA: Insufficient documentation

## 2013-03-15 DIAGNOSIS — N189 Chronic kidney disease, unspecified: Secondary | ICD-10-CM | POA: Insufficient documentation

## 2013-03-15 DIAGNOSIS — R109 Unspecified abdominal pain: Secondary | ICD-10-CM

## 2013-03-15 DIAGNOSIS — Z8719 Personal history of other diseases of the digestive system: Secondary | ICD-10-CM | POA: Insufficient documentation

## 2013-03-15 LAB — CBC
HEMATOCRIT: 45.5 % (ref 39.0–52.0)
Hemoglobin: 16.4 g/dL (ref 13.0–17.0)
MCH: 32.2 pg (ref 26.0–34.0)
MCHC: 36 g/dL (ref 30.0–36.0)
MCV: 89.4 fL (ref 78.0–100.0)
PLATELETS: 271 10*3/uL (ref 150–400)
RBC: 5.09 MIL/uL (ref 4.22–5.81)
RDW: 12.9 % (ref 11.5–15.5)
WBC: 6.8 10*3/uL (ref 4.0–10.5)

## 2013-03-15 LAB — POC OCCULT BLOOD, ED: Fecal Occult Bld: NEGATIVE

## 2013-03-15 LAB — LIPASE, BLOOD: LIPASE: 77 U/L — AB (ref 11–59)

## 2013-03-15 LAB — COMPREHENSIVE METABOLIC PANEL
ALBUMIN: 3.8 g/dL (ref 3.5–5.2)
ALT: 40 U/L (ref 0–53)
AST: 25 U/L (ref 0–37)
Alkaline Phosphatase: 117 U/L (ref 39–117)
BUN: 10 mg/dL (ref 6–23)
CALCIUM: 9.8 mg/dL (ref 8.4–10.5)
CO2: 23 mEq/L (ref 19–32)
CREATININE: 0.98 mg/dL (ref 0.50–1.35)
Chloride: 98 mEq/L (ref 96–112)
GFR calc Af Amer: >90 mL/min (ref >90)
GFR calc non Af Amer: >90 mL/min (ref >90)
Glucose, Bld: 152 mg/dL — ABNORMAL HIGH (ref 70–99)
Potassium: 3.5 mEq/L — ABNORMAL LOW (ref 3.7–5.3)
Sodium: 139 mEq/L (ref 137–147)
Total Bilirubin: 0.7 mg/dL (ref 0.3–1.2)
Total Protein: 7.6 g/dL (ref 6.0–8.3)

## 2013-03-15 MED ORDER — HYDROMORPHONE HCL PF 1 MG/ML IJ SOLN
1.0000 mg | Freq: Once | INTRAMUSCULAR | Status: AC
  Administered 2013-03-15: 1 mg via INTRAVENOUS

## 2013-03-15 MED ORDER — HYDROMORPHONE HCL PF 1 MG/ML IJ SOLN
INTRAMUSCULAR | Status: AC
  Filled 2013-03-15: qty 1

## 2013-03-15 MED ORDER — ONDANSETRON HCL 4 MG/2ML IJ SOLN
INTRAMUSCULAR | Status: AC
  Filled 2013-03-15: qty 2

## 2013-03-15 MED ORDER — ONDANSETRON HCL 4 MG/2ML IJ SOLN
4.0000 mg | Freq: Once | INTRAMUSCULAR | Status: AC
  Administered 2013-03-15: 4 mg via INTRAVENOUS

## 2013-03-15 NOTE — ED Notes (Signed)
Will discharge pt at 0417, 30 minutes after adm of IV narcotics.

## 2013-03-15 NOTE — Discharge Instructions (Signed)
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
## 2013-03-15 NOTE — ED Notes (Signed)
MD at bedside. 

## 2013-03-15 NOTE — ED Notes (Signed)
Patient presents with numerous complaints.  Has had dark stools, been nauseated.  Has numerous appointments scheduled

## 2013-03-16 ENCOUNTER — Emergency Department (HOSPITAL_COMMUNITY)
Admission: EM | Admit: 2013-03-16 | Discharge: 2013-03-16 | Disposition: A | Discharge status: Home / Self Care | Payer: No Typology Code available for payment source | Attending: Emergency Medicine | Admitting: Emergency Medicine

## 2013-03-16 ENCOUNTER — Encounter (HOSPITAL_COMMUNITY): Payer: Self-pay | Admitting: Emergency Medicine

## 2013-03-16 DIAGNOSIS — Z8619 Personal history of other infectious and parasitic diseases: Secondary | ICD-10-CM | POA: Insufficient documentation

## 2013-03-16 DIAGNOSIS — R109 Unspecified abdominal pain: Secondary | ICD-10-CM

## 2013-03-16 DIAGNOSIS — M549 Dorsalgia, unspecified: Secondary | ICD-10-CM | POA: Insufficient documentation

## 2013-03-16 DIAGNOSIS — I209 Angina pectoris, unspecified: Secondary | ICD-10-CM | POA: Insufficient documentation

## 2013-03-16 DIAGNOSIS — R1013 Epigastric pain: Secondary | ICD-10-CM | POA: Insufficient documentation

## 2013-03-16 DIAGNOSIS — Z862 Personal history of diseases of the blood and blood-forming organs and certain disorders involving the immune mechanism: Secondary | ICD-10-CM | POA: Insufficient documentation

## 2013-03-16 DIAGNOSIS — Z8719 Personal history of other diseases of the digestive system: Secondary | ICD-10-CM | POA: Insufficient documentation

## 2013-03-16 DIAGNOSIS — J45909 Unspecified asthma, uncomplicated: Secondary | ICD-10-CM | POA: Insufficient documentation

## 2013-03-16 DIAGNOSIS — R32 Unspecified urinary incontinence: Secondary | ICD-10-CM | POA: Insufficient documentation

## 2013-03-16 DIAGNOSIS — N189 Chronic kidney disease, unspecified: Secondary | ICD-10-CM | POA: Insufficient documentation

## 2013-03-16 DIAGNOSIS — Z79899 Other long term (current) drug therapy: Secondary | ICD-10-CM | POA: Insufficient documentation

## 2013-03-16 DIAGNOSIS — I129 Hypertensive chronic kidney disease with stage 1 through stage 4 chronic kidney disease, or unspecified chronic kidney disease: Secondary | ICD-10-CM | POA: Insufficient documentation

## 2013-03-16 DIAGNOSIS — M542 Cervicalgia: Secondary | ICD-10-CM | POA: Insufficient documentation

## 2013-03-16 DIAGNOSIS — G43909 Migraine, unspecified, not intractable, without status migrainosus: Secondary | ICD-10-CM | POA: Insufficient documentation

## 2013-03-16 DIAGNOSIS — R51 Headache: Secondary | ICD-10-CM | POA: Insufficient documentation

## 2013-03-16 DIAGNOSIS — F17200 Nicotine dependence, unspecified, uncomplicated: Secondary | ICD-10-CM | POA: Insufficient documentation

## 2013-03-16 DIAGNOSIS — G8929 Other chronic pain: Secondary | ICD-10-CM | POA: Insufficient documentation

## 2013-03-16 DIAGNOSIS — Z8639 Personal history of other endocrine, nutritional and metabolic disease: Secondary | ICD-10-CM | POA: Insufficient documentation

## 2013-03-16 LAB — URINE MICROSCOPIC-ADD ON

## 2013-03-16 LAB — HEPATIC FUNCTION PANEL
ALK PHOS: 109 U/L (ref 39–117)
ALT: 35 U/L (ref 0–53)
AST: 20 U/L (ref 0–37)
Albumin: 3.7 g/dL (ref 3.5–5.2)
BILIRUBIN TOTAL: 0.8 mg/dL (ref 0.3–1.2)
Bilirubin, Direct: <0.2 mg/dL (ref 0.0–0.3)
Total Protein: 7.4 g/dL (ref 6.0–8.3)

## 2013-03-16 LAB — CBC
HCT: 45 % (ref 39.0–52.0)
Hemoglobin: 16.4 g/dL (ref 13.0–17.0)
MCH: 32.2 pg (ref 26.0–34.0)
MCHC: 36.4 g/dL — AB (ref 30.0–36.0)
MCV: 88.4 fL (ref 78.0–100.0)
Platelets: 300 10*3/uL (ref 150–400)
RBC: 5.09 MIL/uL (ref 4.22–5.81)
RDW: 12.8 % (ref 11.5–15.5)
WBC: 7.1 10*3/uL (ref 4.0–10.5)

## 2013-03-16 LAB — URINALYSIS, ROUTINE W REFLEX MICROSCOPIC
Glucose, UA: NEGATIVE mg/dL
Hgb urine dipstick: NEGATIVE
KETONES UR: 15 mg/dL — AB
NITRITE: NEGATIVE
Protein, ur: 100 mg/dL — AB
Specific Gravity, Urine: 1.03 (ref 1.005–1.030)
UROBILINOGEN UA: 1 mg/dL (ref 0.0–1.0)
pH: 7 (ref 5.0–8.0)

## 2013-03-16 LAB — I-STAT TROPONIN, ED: Troponin i, poc: 0 ng/mL (ref 0.00–0.08)

## 2013-03-16 LAB — BASIC METABOLIC PANEL
BUN: 10 mg/dL (ref 6–23)
CALCIUM: 9.9 mg/dL (ref 8.4–10.5)
CO2: 25 mEq/L (ref 19–32)
CREATININE: 1 mg/dL (ref 0.50–1.35)
Chloride: 102 mEq/L (ref 96–112)
GFR calc non Af Amer: 83 mL/min — ABNORMAL LOW (ref >90)
Glucose, Bld: 127 mg/dL — ABNORMAL HIGH (ref 70–99)
Potassium: 3.9 mEq/L (ref 3.7–5.3)
Sodium: 141 mEq/L (ref 137–147)

## 2013-03-16 LAB — LIPASE, BLOOD: LIPASE: 76 U/L — AB (ref 11–59)

## 2013-03-16 MED ORDER — HYDROMORPHONE HCL PF 1 MG/ML IJ SOLN
2.0000 mg | INTRAMUSCULAR | Status: AC
  Administered 2013-03-16: 2 mg via INTRAMUSCULAR
  Filled 2013-03-16: qty 2

## 2013-03-16 MED ORDER — HYDROMORPHONE HCL PF 1 MG/ML IJ SOLN
1.0000 mg | INTRAMUSCULAR | Status: DC
Start: 2013-03-16 — End: 2013-03-16
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
## 2013-03-16 NOTE — Discharge Instructions (Signed)
Followup with your pain management physician to discuss your issues.  Return to the ER if you develop bloody stool, high fever, or worsening of your symptoms.   Abdominal Pain, Adult Many things can cause abdominal pain. Usually, abdominal pain is not caused by a disease and will improve without treatment. It can often be observed and treated at home. Your health care provider will do a physical exam and possibly order blood tests and X-rays to help determine the seriousness of your pain. However, in many cases, more time must pass before a clear cause of the pain can be found. Before that point, your health care provider may not know if you need more testing or further treatment. HOME CARE INSTRUCTIONS  Monitor your abdominal pain for any changes. The following actions may help to alleviate any discomfort you are experiencing:  Only take over-the-counter or prescription medicines as directed by your health care provider.  Do not take laxatives unless directed to do so by your health care provider.  Try a clear liquid diet (broth, tea, or water) as directed by your health care provider. Slowly move to a bland diet as tolerated. SEEK MEDICAL CARE IF:  You have unexplained abdominal pain.  You have abdominal pain associated with nausea or diarrhea.  You have pain when you urinate or have a bowel movement.  You experience abdominal pain that wakes you in the night.  You have abdominal pain that is worsened or improved by eating food.  You have abdominal pain that is worsened with eating fatty foods. SEEK IMMEDIATE MEDICAL CARE IF:   Your pain does not go away within 2 hours.  You have a fever.  You keep throwing up (vomiting).  Your pain is felt only in portions of the abdomen, such as the right side or the left lower portion of the abdomen.  You pass bloody or black tarry stools. MAKE SURE YOU:  Understand these instructions.   Will watch your condition.   Will get help  right away if you are not doing well or get worse.  Document Released: 10/15/2004 Document Revised: 10/26/2012 Document Reviewed: 09/14/2012 [facility] Patient Information 2014 [place].

## 2013-03-16 NOTE — ED Notes (Signed)
Pt was given injections of 2 different medications at pain management yesterday. He is under pain management for pancreatitis. He reports since he received the injections hes had intermittent sharp cp, "throbbing in my pancreas," bladder incontinence, headache. He is a&ox4, ambulatory

## 2013-03-18 ENCOUNTER — Encounter (HOSPITAL_COMMUNITY): Payer: Self-pay | Admitting: Emergency Medicine

## 2013-03-18 ENCOUNTER — Emergency Department (HOSPITAL_COMMUNITY)
Admission: EM | Admit: 2013-03-18 | Discharge: 2013-03-18 | Disposition: A | Discharge status: Home / Self Care | Payer: No Typology Code available for payment source | Attending: Emergency Medicine | Admitting: Emergency Medicine

## 2013-03-18 DIAGNOSIS — Z9889 Other specified postprocedural states: Secondary | ICD-10-CM | POA: Insufficient documentation

## 2013-03-18 DIAGNOSIS — F17200 Nicotine dependence, unspecified, uncomplicated: Secondary | ICD-10-CM | POA: Insufficient documentation

## 2013-03-18 DIAGNOSIS — Z862 Personal history of diseases of the blood and blood-forming organs and certain disorders involving the immune mechanism: Secondary | ICD-10-CM | POA: Insufficient documentation

## 2013-03-18 DIAGNOSIS — N189 Chronic kidney disease, unspecified: Secondary | ICD-10-CM | POA: Insufficient documentation

## 2013-03-18 DIAGNOSIS — J45909 Unspecified asthma, uncomplicated: Secondary | ICD-10-CM | POA: Insufficient documentation

## 2013-03-18 DIAGNOSIS — I129 Hypertensive chronic kidney disease with stage 1 through stage 4 chronic kidney disease, or unspecified chronic kidney disease: Secondary | ICD-10-CM | POA: Insufficient documentation

## 2013-03-18 DIAGNOSIS — K861 Other chronic pancreatitis: Secondary | ICD-10-CM | POA: Insufficient documentation

## 2013-03-18 DIAGNOSIS — G8929 Other chronic pain: Secondary | ICD-10-CM | POA: Insufficient documentation

## 2013-03-18 DIAGNOSIS — Z79899 Other long term (current) drug therapy: Secondary | ICD-10-CM | POA: Insufficient documentation

## 2013-03-18 DIAGNOSIS — Z8619 Personal history of other infectious and parasitic diseases: Secondary | ICD-10-CM | POA: Insufficient documentation

## 2013-03-18 DIAGNOSIS — Z8639 Personal history of other endocrine, nutritional and metabolic disease: Secondary | ICD-10-CM | POA: Insufficient documentation

## 2013-03-18 LAB — URINALYSIS, ROUTINE W REFLEX MICROSCOPIC
BILIRUBIN URINE: NEGATIVE
GLUCOSE, UA: NEGATIVE mg/dL
HGB URINE DIPSTICK: NEGATIVE
KETONES UR: NEGATIVE mg/dL
Leukocytes, UA: NEGATIVE
Nitrite: NEGATIVE
PROTEIN: 30 mg/dL — AB
Specific Gravity, Urine: 1.016 (ref 1.005–1.030)
UROBILINOGEN UA: 1 mg/dL (ref 0.0–1.0)
pH: 6 (ref 5.0–8.0)

## 2013-03-18 LAB — URINE MICROSCOPIC-ADD ON

## 2013-03-18 LAB — CBC WITH DIFFERENTIAL/PLATELET
Basophils Absolute: 0 10*3/uL (ref 0.0–0.1)
Basophils Relative: 0 % (ref 0–1)
Eosinophils Absolute: 0.1 10*3/uL (ref 0.0–0.7)
Eosinophils Relative: 1 % (ref 0–5)
HEMATOCRIT: 45.3 % (ref 39.0–52.0)
Hemoglobin: 16.1 g/dL (ref 13.0–17.0)
LYMPHS PCT: 50 % — AB (ref 12–46)
Lymphs Abs: 4.6 10*3/uL — ABNORMAL HIGH (ref 0.7–4.0)
MCH: 31.6 pg (ref 26.0–34.0)
MCHC: 35.5 g/dL (ref 30.0–36.0)
MCV: 88.8 fL (ref 78.0–100.0)
Monocytes Absolute: 0.6 10*3/uL (ref 0.1–1.0)
Monocytes Relative: 6 % (ref 3–12)
NEUTROS ABS: 3.9 10*3/uL (ref 1.7–7.7)
Neutrophils Relative %: 42 % — ABNORMAL LOW (ref 43–77)
PLATELETS: 292 10*3/uL (ref 150–400)
RBC: 5.1 MIL/uL (ref 4.22–5.81)
RDW: 12.7 % (ref 11.5–15.5)
WBC: 9.3 10*3/uL (ref 4.0–10.5)

## 2013-03-18 LAB — COMPREHENSIVE METABOLIC PANEL
ALK PHOS: 109 U/L (ref 39–117)
ALT: 45 U/L (ref 0–53)
AST: 32 U/L (ref 0–37)
Albumin: 3.9 g/dL (ref 3.5–5.2)
BILIRUBIN TOTAL: 0.6 mg/dL (ref 0.3–1.2)
BUN: 13 mg/dL (ref 6–23)
CHLORIDE: 99 meq/L (ref 96–112)
CO2: 25 meq/L (ref 19–32)
Calcium: 9.6 mg/dL (ref 8.4–10.5)
Creatinine, Ser: 1.04 mg/dL (ref 0.50–1.35)
GFR, EST NON AFRICAN AMERICAN: 79 mL/min — AB (ref >90)
Glucose, Bld: 133 mg/dL — ABNORMAL HIGH (ref 70–99)
Potassium: 3.6 mEq/L — ABNORMAL LOW (ref 3.7–5.3)
SODIUM: 140 meq/L (ref 137–147)
Total Protein: 7.5 g/dL (ref 6.0–8.3)

## 2013-03-18 LAB — LIPASE, BLOOD: Lipase: 122 U/L — ABNORMAL HIGH (ref 11–59)

## 2013-03-18 MED ORDER — ONDANSETRON HCL 4 MG/2ML IJ SOLN
4.0000 mg | Freq: Once | INTRAMUSCULAR | Status: AC
  Administered 2013-03-18: 4 mg via INTRAVENOUS
  Filled 2013-03-18: qty 2

## 2013-03-18 MED ORDER — SODIUM CHLORIDE 0.9 % IV BOLUS (SEPSIS)
1000.0000 mL | Freq: Once | INTRAVENOUS | Status: AC
  Administered 2013-03-18: 1000 mL via INTRAVENOUS

## 2013-03-18 MED ORDER — HYDROMORPHONE HCL PF 1 MG/ML IJ SOLN
1.0000 mg | Freq: Once | INTRAMUSCULAR | Status: AC
  Administered 2013-03-18: 1 mg via INTRAVENOUS
  Filled 2013-03-18: qty 1
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
## 2013-03-18 NOTE — ED Notes (Signed)
Pt reports having abd pain and n/v, hx of chronic pancreatitis and also having back pains. No acute distress noted at triage.

## 2013-03-18 NOTE — Discharge Instructions (Signed)
Abdominal Pain, Adult °Many things can cause abdominal pain. Usually, abdominal pain is not caused by a disease and will improve without treatment. It can often be observed and treated at home. Your health care provider will do a physical exam and possibly order blood tests and X-rays to help determine the seriousness of your pain. However, in many cases, more time must pass before a clear cause of the pain can be found. Before that point, your health care provider may not know if you need more testing or further treatment. °HOME CARE INSTRUCTIONS  °Monitor your abdominal pain for any changes. The following actions may help to alleviate any discomfort you are experiencing: °· Only take over-the-counter or prescription medicines as directed by your health care provider. °· Do not take laxatives unless directed to do so by your health care provider. °· Try a clear liquid diet (broth, tea, or water) as directed by your health care provider. Slowly move to a bland diet as tolerated. °SEEK MEDICAL CARE IF: °· You have unexplained abdominal pain. °· You have abdominal pain associated with nausea or diarrhea. °· You have pain when you urinate or have a bowel movement. °· You experience abdominal pain that wakes you in the night. °· You have abdominal pain that is worsened or improved by eating food. °· You have abdominal pain that is worsened with eating fatty foods. °SEEK IMMEDIATE MEDICAL CARE IF:  °· Your pain does not go away within 2 hours. °· You have a fever. °· You keep throwing up (vomiting). °· Your pain is felt only in portions of the abdomen, such as the right side or the left lower portion of the abdomen. °· You pass bloody or black tarry stools. °MAKE SURE YOU: °· Understand these instructions.   °· Will watch your condition.   °· Will get help right away if you are not doing well or get worse.   °Document Released: 10/15/2004 Document Revised: 10/26/2012 Document Reviewed: 09/14/2012 °ExitCare® Patient  Information ©2014 [employer]. °  Emergency Department Resource Guide °1) Find a Doctor and Pay Out of Pocket °Although you won't have to find out who is covered by your insurance plan, it is a good idea to ask around and get recommendations. You will then need to call the office and see if the doctor you have chosen will accept you as a new patient and what types of options they offer for patients who are self-pay. Some doctors offer discounts or will set up payment plans for their patients who do not have insurance, but you will need to ask so you aren't surprised when you get to your appointment. ° °2) Contact Your Local Health Department °Not all health departments have doctors that can see patients for sick visits, but many do, so it is worth a call to see if yours does. If you don't know where your local health department is, you can check in your phone book. The CDC also has a tool to help you locate your state's health department, and many state websites also have listings of all of their local health departments. ° °3) Find a Walk-in Clinic °If your illness is not likely to be very severe or complicated, you may want to try a walk in clinic. These are popping up all over the country in pharmacies, drugstores, and shopping centers. They're usually staffed by nurse practitioners or physician assistants that have been trained to treat common illnesses and complaints. They're usually fairly quick and inexpensive. However, if you have   serious medical issues or chronic medical problems, these are probably not your best option. ° °No Primary Care Doctor: °- Call [facility] at  832-8000 - they can help you locate a primary care doctor that  accepts your insurance, provides certain services, etc. °- Physician Referral Service- 1-800-533-3463 ° °Chronic Pain Problems: °Organization         Address  Phone   Notes  °[facility]  (336) 297-2271 Patients need to be referred by their primary care  doctor.  ° °Medication Assistance: °Organization         Address  Phone   Notes  °[place] [facility] [address] °[place], [place] [address] (336) 641-8030 --Must be a resident of [place] °-- Must have NO insurance coverage whatsoever (no Medicaid/ Medicare, etc.) °-- The pt. MUST have a primary care doctor that directs their care regularly and follows them in the community °  °[facility]  (866) 331-1348   °[employer]  (888) 892-1162   ° °Agencies that provide inexpensive medical care: °Organization         Address  Phone   Notes  °[facility]  (336) 832-8035   °[facility]    (336) 832-7272   °[facility] [address] °[place], [place] [address] (336) 832-4777   °[facility] [address], °[place] (336) 271-4999   °[facility]    (336) 373-0678   °[facility]    (336) 272-1050   °[facility] ° [address], [place] Phone:  (336) 832-4444, Fax:  (336) 832-4440 Hours of Operation:  9 am - 6 pm, M-F.  Also accepts Medicaid/Medicare and self-pay.  °[facility] ° [address], [place] Phone: (336) [phone], Fax: (336) [phone]. Hours of Operation:  8:30 am - 5:30 pm, M-F.  Also accepts Medicaid and self-pay.  °[facility] [place] [address], [place] Phone: (336) 878-6027   °[facility] [address], [place], [place] (336)723-1848, Ext. 123 Mondays & Thursdays: 7-9 AM.  First 15 patients are seen on a first come, first serve basis. °  ° °Medicaid-accepting [place] Providers: ° °Organization         Address  Phone   Notes  °[facility] [address], [place] (336) 641-2100 Also accepts self-pay patients.  °[facility] [address], [place] ° (336) 856-9996   °[facility] [address], [place] (336) 288-8857   °[facility] [address] [place] [address], [place] (336) 299-7000   °Veita Bland [address], [place]  ° (336) 373-1557 Only accepts [place] Access Medicaid patients after they have their name applied to their card.  ° °Self-Pay (no insurance) in [place]: ° °Organization         Address  Phone   Notes  °Sickle Cell Patients, [facility] [address], [place] (336) 832-1970   °[facility] [address], [place] (336) 832-4400   °[facility] ° [address], [place] (336) 992-4800   °[facility]/Dr. Osei-Bonsu ° [address] [place] [address], [place] or [address], [place] (336) [phone] Phone number for both [place] and  locations   is the same.  °[facility] [address], [place] (336) 299-0000   °[facility] [address] [place] [address], [place] or [address] (336) 852-7530 °(336) 878-2260   °[facility] [address], [place] (336) 350-1642, phone; (336) 294-5005, fax Sees patients 1st and 3rd Saturday of every month.  Must not qualify for public or private insurance (i.e. Medicaid, Medicare, [place] Health Choice, Veterans' Benefits) • Household income should be no more than 200% of the poverty level •The clinic cannot treat you if you are pregnant or think you are pregnant • Sexually transmitted diseases are not treated at the clinic.  ° °Dental Care: °Organization         Address  Phone  Notes  °[facility] [address], [place] (336) 641-6152 Accepts children up to age 21 who are enrolled in Medicaid or [place] Health Choice; pregnant women with a Medicaid card; and children who have applied for Medicaid or [place] Health Choice, but were declined, whose parents can pay a reduced fee at time of  service.  °[facility] [place]  [address], [place] (336) 641-7733 Accepts children up to age 21 who are enrolled in Medicaid or [place] Health Choice; pregnant women with a Medicaid card; and children who have applied for Medicaid or [place] Health Choice, but were declined, whose parents can pay a reduced fee at time of service.  °[facility] ° [address], [place] (336) 641-4533 Patients are seen by appointment only. Walk-ins are not accepted. [facility] will see patients 18 years of age and older. °Monday - Tuesday (8am-5pm) °Most Wednesdays (8:30-5pm) °$30 per visit, cash only  °[facility] ° [address], [place] (336) 641-4533 Patients are seen by appointment only. Walk-ins are not accepted. [facility] will see patients 18 years of age and older. °One Wednesday Evening (Monthly: Volunteer Based).  $30 per visit, cash only  °[facility]  (919) 537-3737 for adults; Children under age 4, call Graduate Pediatric Dentistry at (919) [phone]. Children aged 4-14, please call (919) [phone] to request a pediatric application. ° Dental services are provided in all areas of dental care including fillings, crowns and bridges, complete and partial dentures, implants, gum treatment, root canals, and extractions. Preventive care is also provided. Treatment is provided to both adults and children. °Patients are selected via a lottery and there is often a waiting list. °  °[facility] [address], °[place] ° (336) 763-8833 [email or web address] °  °[facility] [address], [place], [place] (336)723-1848, Ext. 123 Second and Fourth Thursday of each month, opens at 6:30 AM; Clinic ends at 9 AM.  Patients are seen on a first-come first-served basis, and a limited number are seen during each clinic.  ° °[facility] ° [address], [place],  [place] (336) 723-7904   Eligibility Requirements °You must have lived in [place], [place], or [place] counties for at least the last three months. °  You cannot be eligible for state or federal sponsored healthcare insurance, including Veterans Administration, Medicaid, or Medicare. °  You generally cannot be eligible for healthcare insurance through your employer.  °  How to apply: °Eligibility screenings are held every Tuesday and Wednesday afternoon from 1:00 pm until 4:00 pm. You do not need an appointment   for the interview!  °[facility] [address], [place], [place] 336-631-2330   °[facility]  336-342-8273   °[facility]  336-703-3100   °[facility]  336-570-6415   ° °Behavioral Health Resources in the Community: °Intensive Outpatient Programs °Organization         Address  Phone  Notes  °[facility] [address], [place], [place] 336-878-6098   °[facility] [address], [place], [place] 336-832-9800   °[facility] [address], [place], [place] ° 336-882-2125   °[facility] [address],  °[place], [place] 1-800-853-5163 or 336-641-4981   °Substance Abuse Resources °Organization         Address  Phone  Notes  °[facility]  336-882-2125   °[facility]  336-784-9470   °[facility]  336-285-9073   °[facility]  336-845-3988   °Residential & Outpatient Substance Abuse Program  1-800-659-3381   °Psychological Services °Organization         Address  Phone  Notes  ° Health  336- 832-9600   °[facility]  336- 378-7881   °[facility] [address], [place] 1-800-853-5163 or 336-641-4981   ° °Mobile Crisis Teams °Organization         Address  Phone  Notes  °[facility], Mobile Crisis Care Unit  1-877-626-1772   °Assertive °[facility] ° [address] [place], [place] 336-834-9664   °Sharon DeEsch [address] °[place] 336-554-5454   ° °Self-Help/Support Groups °Organization         Address  Phone             Notes  °[facility] [place] - variety of support groups  336- 373-1402 Call for more information  °Narcotics Anonymous (NA), [facility] [address], °[place]  2 meetings at this location  ° °Residential Treatment Programs °Organization         Address  Phone  Notes  °[facility] [address],    °[place]  1-866-801-8205   °[facility] ° [address], [place], [place] 704-293-8524   °[facility] [address], [place] 336-845-3988 Admissions: 8am-3pm M-F  °[facility] [address]    °[place], [place] 336-841-1104   °[facility] [address], [place], [place] 336-379-7146   °[facility] [address]  °[place], [place] 336-285-9073   °[facility] Intensive Outpatient [address], [place], [place] 336-852-3033   °[facility] [address]  °[place], [place] 1-877-615-2722 or 336-784-9470   °[facility] [address] [place], [place] 336-227-7417 Accepts Medicaid  °[facility] [address]  °[place] 1-800-659-3381 Substance Abuse/Addiction Treatment  ° °[place] Behavioral Health Resources °Organization         Address  Phone  Notes  °[facility]  (888) 581-9988   °Julie Brannon, PhD [address] [place], [place]   (336) 349-5553 or (336) 951-0000   °[facility]   [address] °[place], [place] (336) 349-4454   °[facility] [address], [place],  (336) 342-8316 Insurance/Medicaid/sponsorship through [facility]  °[facility] [address]                                      [place], [place] (336) 342-8316  Therapy/tele-psych/case  °[facility] [address]  ° [place], [place] (336) 349-2233    °Dr. Arfeen  (336) 349-4544   °[facility] [place]  [employer] [place] [facility] 1) [address], [place] °2) [address], [place] °3)  [address], [place] (336) 349-3220 °(336) 342-7768 ° °(336) 342-8140   °[place] Child Abuse [facility] (336) 342-1394 or (336) 342-3537 (After Hours)    ° °   °

## 2013-03-18 NOTE — ED Notes (Signed)
PT has chronic pancreatitis.Pt here today for continued pain and instructed to return for any pain increase. Pt states he was referred to pain clinic and symptoms have increased since that appt.

## 2013-03-18 NOTE — ED Provider Notes (Signed)
Medical screening examination/treatment/procedure(s) were performed by non-physician practitioner and as supervising physician I was immediately available for consultation/collaboration.   EKG Interpretation None        Wandra Arthurs, MD 03/18/13 2356

## 2013-03-21 ENCOUNTER — Emergency Department (HOSPITAL_COMMUNITY)
Admission: EM | Admit: 2013-03-21 | Discharge: 2013-03-21 | Disposition: A | Discharge status: Home / Self Care | Payer: No Typology Code available for payment source | Attending: Emergency Medicine | Admitting: Emergency Medicine

## 2013-03-21 ENCOUNTER — Encounter (HOSPITAL_COMMUNITY): Payer: Self-pay | Admitting: Emergency Medicine

## 2013-03-21 DIAGNOSIS — J45909 Unspecified asthma, uncomplicated: Secondary | ICD-10-CM | POA: Insufficient documentation

## 2013-03-21 DIAGNOSIS — Z79899 Other long term (current) drug therapy: Secondary | ICD-10-CM | POA: Insufficient documentation

## 2013-03-21 DIAGNOSIS — Z8719 Personal history of other diseases of the digestive system: Secondary | ICD-10-CM | POA: Insufficient documentation

## 2013-03-21 DIAGNOSIS — F17200 Nicotine dependence, unspecified, uncomplicated: Secondary | ICD-10-CM | POA: Insufficient documentation

## 2013-03-21 DIAGNOSIS — R1013 Epigastric pain: Secondary | ICD-10-CM | POA: Insufficient documentation

## 2013-03-21 DIAGNOSIS — Z87448 Personal history of other diseases of urinary system: Secondary | ICD-10-CM | POA: Insufficient documentation

## 2013-03-21 DIAGNOSIS — G8929 Other chronic pain: Secondary | ICD-10-CM

## 2013-03-21 DIAGNOSIS — Z8619 Personal history of other infectious and parasitic diseases: Secondary | ICD-10-CM | POA: Insufficient documentation

## 2013-03-21 DIAGNOSIS — N189 Chronic kidney disease, unspecified: Secondary | ICD-10-CM | POA: Insufficient documentation

## 2013-03-21 DIAGNOSIS — I129 Hypertensive chronic kidney disease with stage 1 through stage 4 chronic kidney disease, or unspecified chronic kidney disease: Secondary | ICD-10-CM | POA: Insufficient documentation

## 2013-03-21 DIAGNOSIS — R109 Unspecified abdominal pain: Secondary | ICD-10-CM

## 2013-03-21 DIAGNOSIS — Z9889 Other specified postprocedural states: Secondary | ICD-10-CM | POA: Insufficient documentation

## 2013-03-21 LAB — URINE MICROSCOPIC-ADD ON

## 2013-03-21 LAB — URINALYSIS, ROUTINE W REFLEX MICROSCOPIC
Glucose, UA: NEGATIVE mg/dL
KETONES UR: NEGATIVE mg/dL
Leukocytes, UA: NEGATIVE
NITRITE: NEGATIVE
PROTEIN: 30 mg/dL — AB
Specific Gravity, Urine: 1.025 (ref 1.005–1.030)
UROBILINOGEN UA: 1 mg/dL (ref 0.0–1.0)
pH: 5.5 (ref 5.0–8.0)

## 2013-03-21 LAB — CBC WITH DIFFERENTIAL/PLATELET
Basophils Absolute: 0 10*3/uL (ref 0.0–0.1)
Basophils Relative: 0 % (ref 0–1)
EOS ABS: 0.2 10*3/uL (ref 0.0–0.7)
Eosinophils Relative: 2 % (ref 0–5)
HCT: 45.1 % (ref 39.0–52.0)
HEMOGLOBIN: 16.3 g/dL (ref 13.0–17.0)
LYMPHS ABS: 5.6 10*3/uL — AB (ref 0.7–4.0)
Lymphocytes Relative: 54 % — ABNORMAL HIGH (ref 12–46)
MCH: 32.5 pg (ref 26.0–34.0)
MCHC: 36.1 g/dL — AB (ref 30.0–36.0)
MCV: 89.8 fL (ref 78.0–100.0)
Monocytes Absolute: 0.7 10*3/uL (ref 0.1–1.0)
Monocytes Relative: 7 % (ref 3–12)
NEUTROS ABS: 3.8 10*3/uL (ref 1.7–7.7)
NEUTROS PCT: 37 % — AB (ref 43–77)
Platelets: 309 10*3/uL (ref 150–400)
RBC: 5.02 MIL/uL (ref 4.22–5.81)
RDW: 12.9 % (ref 11.5–15.5)
WBC: 10.3 10*3/uL (ref 4.0–10.5)

## 2013-03-21 LAB — COMPREHENSIVE METABOLIC PANEL
ALK PHOS: 118 U/L — AB (ref 39–117)
ALT: 42 U/L (ref 0–53)
AST: 27 U/L (ref 0–37)
Albumin: 3.8 g/dL (ref 3.5–5.2)
BILIRUBIN TOTAL: 0.6 mg/dL (ref 0.3–1.2)
BUN: 11 mg/dL (ref 6–23)
CHLORIDE: 102 meq/L (ref 96–112)
CO2: 26 mEq/L (ref 19–32)
Calcium: 9.4 mg/dL (ref 8.4–10.5)
Creatinine, Ser: 1.03 mg/dL (ref 0.50–1.35)
GFR calc Af Amer: >90 mL/min (ref >90)
GFR calc non Af Amer: 80 mL/min — ABNORMAL LOW (ref >90)
GLUCOSE: 106 mg/dL — AB (ref 70–99)
POTASSIUM: 3.6 meq/L — AB (ref 3.7–5.3)
Sodium: 143 mEq/L (ref 137–147)
Total Protein: 7.5 g/dL (ref 6.0–8.3)

## 2013-03-21 LAB — LIPASE, BLOOD: Lipase: 125 U/L — ABNORMAL HIGH (ref 11–59)

## 2013-03-21 MED ORDER — ONDANSETRON HCL 4 MG PO TABS: 4.0000 mg | ORAL_TABLET | Freq: Four times a day (QID) | ORAL | Status: DC

## 2013-03-21 MED ORDER — HYDROMORPHONE HCL PF 1 MG/ML IJ SOLN
1.0000 mg | Freq: Once | INTRAMUSCULAR | Status: AC
  Administered 2013-03-21: 1 mg via INTRAVENOUS
  Filled 2013-03-21: qty 1

## 2013-03-21 MED ORDER — ONDANSETRON HCL 4 MG/2ML IJ SOLN
4.0000 mg | Freq: Once | INTRAMUSCULAR | Status: AC
  Administered 2013-03-21: 4 mg via INTRAVENOUS
  Filled 2013-03-21: qty 2

## 2013-03-21 MED ORDER — SODIUM CHLORIDE 0.9 % IV BOLUS (SEPSIS)
1000.0000 mL | Freq: Once | INTRAVENOUS | Status: AC
  Administered 2013-03-21: 1000 mL via INTRAVENOUS
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
## 2013-03-21 NOTE — Discharge Instructions (Signed)

## 2013-03-21 NOTE — ED Notes (Signed)
Pt. reports pancreatitis flare up this evening with emesis / abdominal pain  , denies fever or chills.

## 2013-03-21 NOTE — ED Notes (Signed)
Informed Dr. Tawnya Crook of patient's request for additional pain medication since the first dose didn't help much.

## 2013-03-23 ENCOUNTER — Emergency Department (HOSPITAL_COMMUNITY)
Admission: EM | Admit: 2013-03-23 | Discharge: 2013-03-23 | Disposition: A | Discharge status: Home / Self Care | Payer: Medicaid Other | Attending: Emergency Medicine | Admitting: Emergency Medicine

## 2013-03-23 ENCOUNTER — Encounter (HOSPITAL_COMMUNITY): Payer: Self-pay | Admitting: Emergency Medicine

## 2013-03-23 DIAGNOSIS — N189 Chronic kidney disease, unspecified: Secondary | ICD-10-CM | POA: Insufficient documentation

## 2013-03-23 DIAGNOSIS — Z8639 Personal history of other endocrine, nutritional and metabolic disease: Secondary | ICD-10-CM | POA: Insufficient documentation

## 2013-03-23 DIAGNOSIS — Z8719 Personal history of other diseases of the digestive system: Secondary | ICD-10-CM | POA: Diagnosis not present

## 2013-03-23 DIAGNOSIS — Z8669 Personal history of other diseases of the nervous system and sense organs: Secondary | ICD-10-CM | POA: Insufficient documentation

## 2013-03-23 DIAGNOSIS — Z8619 Personal history of other infectious and parasitic diseases: Secondary | ICD-10-CM | POA: Diagnosis not present

## 2013-03-23 DIAGNOSIS — F17200 Nicotine dependence, unspecified, uncomplicated: Secondary | ICD-10-CM | POA: Insufficient documentation

## 2013-03-23 DIAGNOSIS — I209 Angina pectoris, unspecified: Secondary | ICD-10-CM | POA: Insufficient documentation

## 2013-03-23 DIAGNOSIS — Z79899 Other long term (current) drug therapy: Secondary | ICD-10-CM | POA: Insufficient documentation

## 2013-03-23 DIAGNOSIS — K861 Other chronic pancreatitis: Secondary | ICD-10-CM | POA: Insufficient documentation

## 2013-03-23 DIAGNOSIS — Z862 Personal history of diseases of the blood and blood-forming organs and certain disorders involving the immune mechanism: Secondary | ICD-10-CM | POA: Insufficient documentation

## 2013-03-23 DIAGNOSIS — I129 Hypertensive chronic kidney disease with stage 1 through stage 4 chronic kidney disease, or unspecified chronic kidney disease: Secondary | ICD-10-CM | POA: Insufficient documentation

## 2013-03-23 DIAGNOSIS — R109 Unspecified abdominal pain: Secondary | ICD-10-CM | POA: Diagnosis present

## 2013-03-23 DIAGNOSIS — G43909 Migraine, unspecified, not intractable, without status migrainosus: Secondary | ICD-10-CM | POA: Insufficient documentation

## 2013-03-23 DIAGNOSIS — J45909 Unspecified asthma, uncomplicated: Secondary | ICD-10-CM | POA: Insufficient documentation

## 2013-03-23 DIAGNOSIS — Z87898 Personal history of other specified conditions: Secondary | ICD-10-CM | POA: Diagnosis not present

## 2013-03-23 DIAGNOSIS — R112 Nausea with vomiting, unspecified: Secondary | ICD-10-CM

## 2013-03-23 DIAGNOSIS — G8929 Other chronic pain: Secondary | ICD-10-CM | POA: Insufficient documentation

## 2013-03-23 LAB — URINALYSIS, ROUTINE W REFLEX MICROSCOPIC
Bilirubin Urine: NEGATIVE
Glucose, UA: NEGATIVE mg/dL
Ketones, ur: NEGATIVE mg/dL
Leukocytes, UA: NEGATIVE
Nitrite: NEGATIVE
PROTEIN: NEGATIVE mg/dL
Specific Gravity, Urine: 1.011 (ref 1.005–1.030)
Urobilinogen, UA: 0.2 mg/dL (ref 0.0–1.0)
pH: 5.5 (ref 5.0–8.0)

## 2013-03-23 LAB — COMPREHENSIVE METABOLIC PANEL
ALT: 40 U/L (ref 0–53)
AST: 22 U/L (ref 0–37)
Albumin: 3.8 g/dL (ref 3.5–5.2)
Alkaline Phosphatase: 111 U/L (ref 39–117)
BUN: 11 mg/dL (ref 6–23)
CALCIUM: 9.6 mg/dL (ref 8.4–10.5)
CO2: 26 meq/L (ref 19–32)
Chloride: 100 mEq/L (ref 96–112)
Creatinine, Ser: 1.01 mg/dL (ref 0.50–1.35)
GFR calc Af Amer: >90 mL/min (ref >90)
GFR, EST NON AFRICAN AMERICAN: 82 mL/min — AB (ref >90)
GLUCOSE: 142 mg/dL — AB (ref 70–99)
Potassium: 3.4 mEq/L — ABNORMAL LOW (ref 3.7–5.3)
SODIUM: 140 meq/L (ref 137–147)
Total Bilirubin: 0.7 mg/dL (ref 0.3–1.2)
Total Protein: 7.1 g/dL (ref 6.0–8.3)

## 2013-03-23 LAB — CBC WITH DIFFERENTIAL/PLATELET
Basophils Absolute: 0 10*3/uL (ref 0.0–0.1)
Basophils Relative: 0 % (ref 0–1)
EOS PCT: 1 % (ref 0–5)
Eosinophils Absolute: 0.1 10*3/uL (ref 0.0–0.7)
HEMATOCRIT: 44.1 % (ref 39.0–52.0)
HEMOGLOBIN: 16 g/dL (ref 13.0–17.0)
LYMPHS ABS: 3.5 10*3/uL (ref 0.7–4.0)
LYMPHS PCT: 41 % (ref 12–46)
MCH: 32.3 pg (ref 26.0–34.0)
MCHC: 36.3 g/dL — ABNORMAL HIGH (ref 30.0–36.0)
MCV: 88.9 fL (ref 78.0–100.0)
MONOS PCT: 7 % (ref 3–12)
Monocytes Absolute: 0.6 10*3/uL (ref 0.1–1.0)
Neutro Abs: 4.3 10*3/uL (ref 1.7–7.7)
Neutrophils Relative %: 50 % (ref 43–77)
Platelets: 289 10*3/uL (ref 150–400)
RBC: 4.96 MIL/uL (ref 4.22–5.81)
RDW: 12.9 % (ref 11.5–15.5)
WBC: 8.5 10*3/uL (ref 4.0–10.5)

## 2013-03-23 LAB — LIPASE, BLOOD: Lipase: 104 U/L — ABNORMAL HIGH (ref 11–59)

## 2013-03-23 LAB — URINE MICROSCOPIC-ADD ON

## 2013-03-23 MED ORDER — SODIUM CHLORIDE 0.9 % IV BOLUS (SEPSIS)
1000.0000 mL | Freq: Once | INTRAVENOUS | Status: AC
  Administered 2013-03-23: 1000 mL via INTRAVENOUS

## 2013-03-23 MED ORDER — OXYCODONE-ACETAMINOPHEN 5-325 MG PO TABS: 1.0000 | ORAL_TABLET | ORAL | Status: DC | PRN

## 2013-03-23 MED ORDER — MORPHINE SULFATE 4 MG/ML IJ SOLN
4.0000 mg | INTRAMUSCULAR | Status: AC
  Administered 2013-03-23: 4 mg via INTRAVENOUS
  Filled 2013-03-23: qty 1

## 2013-03-23 MED ORDER — ONDANSETRON HCL 4 MG/2ML IJ SOLN
4.0000 mg | Freq: Once | INTRAMUSCULAR | Status: AC
  Administered 2013-03-23: 4 mg via INTRAVENOUS
  Filled 2013-03-23: qty 2

## 2013-03-23 MED ORDER — ONDANSETRON HCL 4 MG PO TABS: 4.0000 mg | ORAL_TABLET | Freq: Four times a day (QID) | ORAL | Status: DC

## 2013-03-23 MED ORDER — MORPHINE SULFATE 4 MG/ML IJ SOLN
4.0000 mg | Freq: Once | INTRAMUSCULAR | Status: AC
  Administered 2013-03-23: 4 mg via INTRAVENOUS
  Filled 2013-03-23: qty 1

## 2013-03-23 NOTE — Discharge Instructions (Signed)
Nausea and Vomiting Nausea is a sick feeling that often comes before throwing up (vomiting). Vomiting is a reflex where stomach contents come out of your mouth. Vomiting can cause severe loss of body fluids (dehydration). Children and elderly adults can become dehydrated quickly, especially if they also have diarrhea. Nausea and vomiting are symptoms of a condition or disease. It is important to find the cause of your symptoms. CAUSES   Direct irritation of the stomach lining. This irritation can result from increased acid production (gastroesophageal reflux disease), infection, food poisoning, taking certain medicines (such as nonsteroidal anti-inflammatory drugs), alcohol use, or tobacco use.  Signals from the brain.These signals could be caused by a headache, heat exposure, an inner ear disturbance, increased pressure in the brain from injury, infection, a tumor, or a concussion, pain, emotional stimulus, or metabolic problems.  An obstruction in the gastrointestinal tract (bowel obstruction).  Illnesses such as diabetes, hepatitis, gallbladder problems, appendicitis, kidney problems, cancer, sepsis, atypical symptoms of a heart attack, or eating disorders.  Medical treatments such as chemotherapy and radiation.  Receiving medicine that makes you sleep (general anesthetic) during surgery. DIAGNOSIS Your caregiver may ask for tests to be done if the problems do not improve after a few days. Tests may also be done if symptoms are severe or if the reason for the nausea and vomiting is not clear. Tests may include:  Urine tests.  Blood tests.  Stool tests.  Cultures (to look for evidence of infection).  X-rays or other imaging studies. Test results can help your caregiver make decisions about treatment or the need for additional tests. TREATMENT You need to stay well hydrated. Drink frequently but in small amounts.You may wish to drink water, sports drinks, clear broth, or eat frozen  ice pops or gelatin dessert to help stay hydrated.When you eat, eating slowly may help prevent nausea.There are also some antinausea medicines that may help prevent nausea. HOME CARE INSTRUCTIONS   Take all medicine as directed by your caregiver.  If you do not have an appetite, do not force yourself to eat. However, you must continue to drink fluids.  If you have an appetite, eat a normal diet unless your caregiver tells you differently.  Eat a variety of complex carbohydrates (rice, wheat, potatoes, bread), lean meats, yogurt, fruits, and vegetables.  Avoid high-fat foods because they are more difficult to digest.  Drink enough water and fluids to keep your urine clear or pale yellow.  If you are dehydrated, ask your caregiver for specific rehydration instructions. Signs of dehydration may include:  Severe thirst.  Dry lips and mouth.  Dizziness.  Dark urine.  Decreasing urine frequency and amount.  Confusion.  Rapid breathing or pulse. SEEK IMMEDIATE MEDICAL CARE IF:   You have blood or brown flecks (like coffee grounds) in your vomit.  You have black or bloody stools.  You have a severe headache or stiff neck.  You are confused.  You have severe abdominal pain.  You have chest pain or trouble breathing.  You do not urinate at least once every 8 hours.  You develop cold or clammy skin.  You continue to vomit for longer than 24 to 48 hours.  You have a fever. MAKE SURE YOU:   Understand these instructions.  Will watch your condition.  Will get help right away if you are not doing well or get worse. Document Released: 01/05/2005 Document Revised: 03/30/2011 Document Reviewed: 06/04/2010 [facility] Patient Information 2014 [place], [place]. Acute Pancreatitis Acute pancreatitis is  a disease in which the pancreas becomes suddenly inflamed. The pancreas is a large gland located behind your stomach. The pancreas produces enzymes that help digest food. The  pancreas also releases the hormones glucagon and insulin that help regulate blood sugar. Damage to the pancreas occurs when the digestive enzymes from the pancreas are activated and begin attacking the pancreas before being released into the intestine. Most acute attacks last a couple of days and can cause serious complications. Some people become dehydrated and develop low blood pressure. In severe cases, bleeding into the pancreas can lead to shock and can be life-threatening. The lungs, heart, and kidneys may fail. CAUSES  Pancreatitis can happen to anyone. In some cases, the cause is unknown. Most cases are caused by:  Alcohol abuse.  Gallstones. Other less common causes are:  Certain medicines.  Exposure to certain chemicals.  Infection.  Damage caused by an accident (trauma).  Abdominal surgery. SYMPTOMS   Pain in the upper abdomen that may radiate to the back.  Tenderness and swelling of the abdomen.  Nausea and vomiting. DIAGNOSIS  Your caregiver will perform a physical exam. Blood and stool tests may be done to confirm the diagnosis. Imaging tests may also be done, such as X-rays, CT scans, or an ultrasound of the abdomen. TREATMENT  Treatment usually requires a stay in the hospital. Treatment may include:  Pain medicine.  Fluid replacement through an intravenous line (IV).  Placing a tube in the stomach to remove stomach contents and control vomiting.  Not eating for 3 or 4 days. This gives your pancreas a rest, because enzymes are not being produced that can cause further damage.  Antibiotic medicines if your condition is caused by an infection.  Surgery of the pancreas or gallbladder. HOME CARE INSTRUCTIONS   Follow the diet advised by your caregiver. This may involve avoiding alcohol and decreasing the amount of fat in your diet.  Eat smaller, more frequent meals. This reduces the amount of digestive juices the pancreas produces.  Drink enough fluids to  keep your urine clear or pale yellow.  Only take over-the-counter or prescription medicines as directed by your caregiver.  Avoid drinking alcohol if it caused your condition.  Do not smoke.  Get plenty of rest.  Check your blood sugar at home as directed by your caregiver.  Keep all follow-up appointments as directed by your caregiver. SEEK MEDICAL CARE IF:   You do not recover as quickly as expected.  You develop new or worsening symptoms.  You have persistent pain, weakness, or nausea.  You recover and then have another episode of pain. SEEK IMMEDIATE MEDICAL CARE IF:   You are unable to eat or keep fluids down.  Your pain becomes severe.  You have a fever or persistent symptoms for more than 2 to 3 days.  You have a fever and your symptoms suddenly get worse.  Your skin or the white part of your eyes turn yellow (jaundice).  You develop vomiting.  You feel dizzy, or you faint.  Your blood sugar is high (over 300 mg/dL). MAKE SURE YOU:   Understand these instructions.  Will watch your condition.  Will get help right away if you are not doing well or get worse. Document Released: 01/05/2005 Document Revised: 07/07/2011 Document Reviewed: 04/16/2011 [facility] Patient Information 2014 [place].

## 2013-03-23 NOTE — ED Notes (Signed)
Pt reports left side abdominal pain for 1 week. States that he has chronic pancreatitis and thinks he is having a flare up. Has an appt with Pain Clinic tomorrow but pain is so bad that he came to the ER today.

## 2013-03-23 NOTE — ED Notes (Signed)
Pt still in room getting dressed 

## 2013-03-23 NOTE — ED Notes (Signed)
Pt presents with onset of L sided abdominal pain that began today.  +nausea and vomiting - pt reports h/o pancreatitis
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
## 2013-04-01 ENCOUNTER — Other Ambulatory Visit: Payer: Self-pay

## 2013-04-01 ENCOUNTER — Emergency Department (HOSPITAL_COMMUNITY)
Admission: EM | Admit: 2013-04-01 | Discharge: 2013-04-01 | Disposition: A | Discharge status: Home / Self Care | Payer: Medicaid Other | Attending: Emergency Medicine | Admitting: Emergency Medicine

## 2013-04-01 ENCOUNTER — Encounter (HOSPITAL_COMMUNITY): Payer: Self-pay | Admitting: Emergency Medicine

## 2013-04-01 DIAGNOSIS — R1012 Left upper quadrant pain: Secondary | ICD-10-CM | POA: Insufficient documentation

## 2013-04-01 DIAGNOSIS — F17200 Nicotine dependence, unspecified, uncomplicated: Secondary | ICD-10-CM | POA: Insufficient documentation

## 2013-04-01 DIAGNOSIS — Z8719 Personal history of other diseases of the digestive system: Secondary | ICD-10-CM | POA: Insufficient documentation

## 2013-04-01 DIAGNOSIS — I129 Hypertensive chronic kidney disease with stage 1 through stage 4 chronic kidney disease, or unspecified chronic kidney disease: Secondary | ICD-10-CM | POA: Insufficient documentation

## 2013-04-01 DIAGNOSIS — R112 Nausea with vomiting, unspecified: Secondary | ICD-10-CM | POA: Insufficient documentation

## 2013-04-01 DIAGNOSIS — Z79899 Other long term (current) drug therapy: Secondary | ICD-10-CM | POA: Insufficient documentation

## 2013-04-01 DIAGNOSIS — G8929 Other chronic pain: Secondary | ICD-10-CM | POA: Insufficient documentation

## 2013-04-01 DIAGNOSIS — I209 Angina pectoris, unspecified: Secondary | ICD-10-CM | POA: Insufficient documentation

## 2013-04-01 DIAGNOSIS — J45909 Unspecified asthma, uncomplicated: Secondary | ICD-10-CM | POA: Insufficient documentation

## 2013-04-01 DIAGNOSIS — Z87448 Personal history of other diseases of urinary system: Secondary | ICD-10-CM | POA: Insufficient documentation

## 2013-04-01 DIAGNOSIS — N189 Chronic kidney disease, unspecified: Secondary | ICD-10-CM | POA: Insufficient documentation

## 2013-04-01 DIAGNOSIS — R109 Unspecified abdominal pain: Secondary | ICD-10-CM

## 2013-04-01 DIAGNOSIS — Z8619 Personal history of other infectious and parasitic diseases: Secondary | ICD-10-CM | POA: Insufficient documentation

## 2013-04-01 LAB — CBC WITH DIFFERENTIAL/PLATELET
Basophils Absolute: 0 10*3/uL (ref 0.0–0.1)
Basophils Relative: 0 % (ref 0–1)
EOS PCT: 1 % (ref 0–5)
Eosinophils Absolute: 0.1 10*3/uL (ref 0.0–0.7)
HCT: 49 % (ref 39.0–52.0)
HEMOGLOBIN: 17.7 g/dL — AB (ref 13.0–17.0)
Lymphocytes Relative: 40 % (ref 12–46)
Lymphs Abs: 3.2 10*3/uL (ref 0.7–4.0)
MCH: 32.5 pg (ref 26.0–34.0)
MCHC: 36.1 g/dL — ABNORMAL HIGH (ref 30.0–36.0)
MCV: 90.1 fL (ref 78.0–100.0)
Monocytes Absolute: 0.5 10*3/uL (ref 0.1–1.0)
Monocytes Relative: 6 % (ref 3–12)
Neutro Abs: 4.3 10*3/uL (ref 1.7–7.7)
Neutrophils Relative %: 53 % (ref 43–77)
Platelets: 286 10*3/uL (ref 150–400)
RBC: 5.44 MIL/uL (ref 4.22–5.81)
RDW: 13.1 % (ref 11.5–15.5)
WBC: 8.1 10*3/uL (ref 4.0–10.5)

## 2013-04-01 LAB — URINALYSIS, ROUTINE W REFLEX MICROSCOPIC
GLUCOSE, UA: NEGATIVE mg/dL
Ketones, ur: NEGATIVE mg/dL
Leukocytes, UA: NEGATIVE
Nitrite: NEGATIVE
PH: 5.5 (ref 5.0–8.0)
Protein, ur: 100 mg/dL — AB
SPECIFIC GRAVITY, URINE: 1.022 (ref 1.005–1.030)
Urobilinogen, UA: 0.2 mg/dL (ref 0.0–1.0)

## 2013-04-01 LAB — URINE MICROSCOPIC-ADD ON

## 2013-04-01 LAB — I-STAT TROPONIN, ED: Troponin i, poc: 0 ng/mL (ref 0.00–0.08)

## 2013-04-01 LAB — COMPREHENSIVE METABOLIC PANEL
ALBUMIN: 4.1 g/dL (ref 3.5–5.2)
ALK PHOS: 112 U/L (ref 39–117)
ALT: 40 U/L (ref 0–53)
AST: 24 U/L (ref 0–37)
BILIRUBIN TOTAL: 0.9 mg/dL (ref 0.3–1.2)
BUN: 11 mg/dL (ref 6–23)
CALCIUM: 10.1 mg/dL (ref 8.4–10.5)
CO2: 25 mEq/L (ref 19–32)
Chloride: 98 mEq/L (ref 96–112)
Creatinine, Ser: 1.03 mg/dL (ref 0.50–1.35)
GFR calc Af Amer: >90 mL/min (ref >90)
GFR calc non Af Amer: 80 mL/min — ABNORMAL LOW (ref >90)
GLUCOSE: 180 mg/dL — AB (ref 70–99)
Potassium: 3.9 mEq/L (ref 3.7–5.3)
Sodium: 138 mEq/L (ref 137–147)
Total Protein: 7.8 g/dL (ref 6.0–8.3)

## 2013-04-01 LAB — LIPASE, BLOOD: LIPASE: 53 U/L (ref 11–59)

## 2013-04-01 MED ORDER — ONDANSETRON HCL 4 MG PO TABS: 4.0000 mg | ORAL_TABLET | Freq: Three times a day (TID) | ORAL | Status: DC | PRN

## 2013-04-01 MED ORDER — SODIUM CHLORIDE 0.9 % IV BOLUS (SEPSIS)
1000.0000 mL | Freq: Once | INTRAVENOUS | Status: AC
  Administered 2013-04-01: 1000 mL via INTRAVENOUS

## 2013-04-01 MED ORDER — ONDANSETRON HCL 4 MG/2ML IJ SOLN
4.0000 mg | Freq: Once | INTRAMUSCULAR | Status: AC
  Administered 2013-04-01: 4 mg via INTRAVENOUS
  Filled 2013-04-01: qty 2

## 2013-04-01 MED ORDER — MORPHINE SULFATE 4 MG/ML IJ SOLN
4.0000 mg | Freq: Once | INTRAMUSCULAR | Status: AC
  Administered 2013-04-01: 4 mg via INTRAVENOUS
  Filled 2013-04-01: qty 1

## 2013-04-01 MED ORDER — OXYCODONE-ACETAMINOPHEN 5-325 MG PO TABS
2.0000 | ORAL_TABLET | Freq: Once | ORAL | Status: DC
  Filled 2013-04-01: qty 2

## 2013-04-01 MED ORDER — OXYCODONE-ACETAMINOPHEN 5-325 MG PO TABS: 2.0000 | ORAL_TABLET | Freq: Four times a day (QID) | ORAL | Status: DC | PRN

## 2013-04-01 NOTE — ED Notes (Signed)
Pt presents to department for evaluation of epigastric pain, numbness to L arm and heart "fluttering." states frequent pain for pancreatitis, states he had medications changed at pain clinic, states symptoms started after. 8/10 pain at the time. Pt is alert and oriented x4.

## 2013-04-01 NOTE — Discharge Instructions (Signed)
Pancreatic Cancer  Pancreatic cancer is an abnormal growth of tissue (tumor) in the pancreas that is cancerous (malignant). Unlike noncancerous (benign) tumors, malignant tumors can spread to other parts of your body. The pancreas is a gland located deep in the abdomen, between the stomach and the spine. The pancreas makes insulin and other hormones. These hormones help the body use or store the energy that comes from food. The pancreas also makes pancreatic juices. These juices contain enzymes that help digest food. Most pancreatic cancers begin in the ducts that carry pancreatic juices. When cancer of the pancreas spreads (metastasizes) outside the pancreas, cancer cells are often found in nearby lymph nodes. If the cancer has reached these nodes, it means that cancer cells may have spread to other lymph nodes or other tissues, such as the liver or lungs. Sometimes cancer of the pancreas spreads to the peritoneum. This is the tissue that lines the abdomen. CAUSES  The exact cause of pancreatic cancer is unknown.  RISK FACTORS There are a number of risk factors that can increase your chances of getting pancreatic cancer. They include:  Age. The likelihood of developing pancreatic cancer increases with age. Most pancreatic cancers occur in people older than 60 years.  Smoking. Cigarette smokers are 2 to 3 times more likely than nonsmokers to develop pancreatic cancer.  Diabetes, especially if you were diagnosed as an adult.  Being male.  Being African American.  Family history. The risk for developing pancreatic cancer triples if a person's mother, father, sister, or brother had the disease. Also, a family history of colon cancer or ovarian cancer increases the risk of pancreatic cancer.  Chronic pancreatitis. Chronic pancreatitis is a painful condition of the pancreas.  Exposure to certain chemicals in the workplace.  Being obese or eating a diet high in fat and red meat. SYMPTOMS    Pancreatic cancer is sometimes called a "silent disease." This is because early pancreatic cancer often does not cause symptoms. As the cancer grows, symptoms may include:  Weakness.  Abdominal pain.  Diarrhea.  Depression.  Loss of appetite.  Indigestion.  Pain in the upper abdomen or upper back.  Nausea and vomiting.  Yellowing of the skin or eyes (jaundice).  Back pain.  Weight loss.  Fatigue.  Clay-colored stools.  Unexplained blood clots.  Dark urine. DIAGNOSIS  Your caregiver will ask about your medical history. He or she may also perform a number of procedures, such as:  A physical exam. Your skin and eyes will be examined for signs of jaundice. The abdomen will be checked for changes in the area near the pancreas, liver, and gallbladder. Your caregiver will also check for an abnormal buildup of fluid in the abdomen (ascites).  Lab tests. Your caregiver may take blood and urine samples to check for bilirubin and other substances. Bilirubin is a substance that passes from the liver to the intestine through the gallbladder and bile duct. If the bile duct is blocked by a tumor, the bilirubin cannot pass through normally. Blockage of the bile duct may cause the level of bilirubin in the blood and urine to become very high.  Computed tomography (CT). An X-ray machine linked to a computer takes a series of detailed pictures of the pancreas and other organs and blood vessels in the abdomen.  Ultrasonography. The ultrasound device uses sound waves to produce pictures of the pancreas and other organs inside the abdomen.  Endoscopic retrograde cholangiopancreatography. A lighted tube (endoscope) is passed through the patient's  mouth and stomach, down into the small intestine. Then a smaller tube (catheter) is inserted through the endoscope into the bile ducts and pancreatic ducts. A dye is injected through the catheter into the ducts and X-rays are taken. The X-rays can show  whether the ducts are narrowed or blocked by a tumor.  Endoscopic ultrasonography. An endoscope is passed through the patient's mouth and stomach, down into the small intestine. An ultrasound device is placed down the endoscope to produce pictures of the area, including the pancreas.  Percutaneous transhepatic cholangiography. A dye is injected through a thin needle into the liver and X-rays are taken. Unless there is a blockage, the dye should move freely through the bile ducts. From the X-rays, your caregiver can tell whether there is a blockage from a tumor.  Taking a tissue sample (biopsy) from the pancreas. The sample is examined under a microscope to look for cancer cells. Your cancer will be staged according to its severity and extent. Staging is a careful attempt to categorize your cancer to help determine which treatment will be most appropriate. Factors involved in staging include the size of the tumor, whether the cancer has spread, and if so, to what parts of the body it has spread. You may need to have more tests to determine the stage of your cancer. The test results will help determine what treatment plan is best for you. STAGES   Stage I. The cancer is only found in the pancreas.  Stage II. The cancer has spread to nearby tissues and possibly to the lymph nodes, but not to the blood vessels.  Stage III. The cancer is surrounding the major blood vessels beside the pancreas and has possibly spread to the lymph nodes.  Stage IV. The cancer has spread to other parts of the body, such as the liver, lungs, or peritoneum. TREATMENT  Treatment generally begins within several weeks after the diagnosis. There will be time to talk with your caregiver about treatment choices, get a second opinion, and learn more about the disease. Your caregiver may refer you to a cancer specialist (oncologist).  Cancer of the pancreas is very hard to control with current treatments. For that reason, many  caregivers encourage patients with this disease to consider taking part in a clinical trial. Clinical trials are an important option for people with all stages of pancreatic cancer.  At this time, pancreatic cancer can be cured only when it is found at an early stage, before it has spread. However, other treatments may be able to control the disease and help patients live longer and feel better. When a cure or control of the disease is not possible, some patients choose palliative therapy. Palliative therapy aims to improve quality of life by controlling pain and other problems caused by this disease, but it does not cure the disease. Depending on the type and stage, pancreatic cancer may be treated with surgery, radiation therapy, or chemotherapy. Some patients have a combination of these therapies.  Surgery may be done to remove all or part of the pancreas. Sometimes the cancer cannot be completely removed. However, if the tumor is blocking the common bile duct or duodenum, the surgeon can place a mesh tube (stent) in the blocked area. This helps keep the duct or duodenum open.  Radiation therapy uses high-energy rays to kill cancer cells.  Chemotherapy is the use of drugs to kill cancer cells. Caregivers also give chemotherapy to help reduce pain and other problems caused by pancreatic  cancer. HOME CARE INSTRUCTIONS   Only take over-the-counter or prescription medicines for pain, discomfort, or fever as directed by your caregiver.  Maintain a healthy diet.  Consider joining a support group. This may help you learn to cope with the stress of having pancreatic cancer.  Seek advice to help you manage treatment side effects.  Keep all follow-up appointments as directed by your caregiver. SEEK IMMEDIATE MEDICAL CARE IF:   You have a sudden increase in pain.  Your skin or eyes turn more yellow.  You lose weight without trying.  You have a fever, especially during chemotherapy treatment or  after stent placement.  You notice new fatigue or weakness. Document Released: 12/21/2003 Document Revised: 03/30/2011 Document Reviewed: 01/30/2011 [facility] Patient Information 2014 [place], [place].   Follow-up with your primary doctor for pain management or referral to pain clinic
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
## 2013-04-01 NOTE — ED Notes (Signed)
Patient discharged to home with family. NAD.  

## 2013-04-03 ENCOUNTER — Telehealth: Payer: Self-pay | Admitting: *Deleted

## 2013-04-03 NOTE — Telephone Encounter (Signed)
Ran [place] narc database, placed in dr [employer]

## 2013-04-03 NOTE — Telephone Encounter (Signed)
Per doriss. Called pt to schedule an appt. He is very frustrated with his recent care. appt is made w/ dr schooler for tues 3/17. States he will bring all his ED visit notes and will discuss new pain management referral and cardiologist

## 2013-04-04 ENCOUNTER — Ambulatory Visit (INDEPENDENT_AMBULATORY_CARE_PROVIDER_SITE_OTHER): Payer: No Typology Code available for payment source | Admitting: Internal Medicine

## 2013-04-04 ENCOUNTER — Encounter: Payer: Self-pay | Admitting: Internal Medicine

## 2013-04-04 ENCOUNTER — Other Ambulatory Visit: Payer: Self-pay | Admitting: *Deleted

## 2013-04-04 VITALS — BP 142/86 | HR 90 | Temp 98.0°F | Ht 68.0 in | Wt 163.9 lb

## 2013-04-04 DIAGNOSIS — G8929 Other chronic pain: Secondary | ICD-10-CM

## 2013-04-04 DIAGNOSIS — R319 Hematuria, unspecified: Principal | ICD-10-CM

## 2013-04-04 DIAGNOSIS — N411 Chronic prostatitis: Secondary | ICD-10-CM

## 2013-04-04 DIAGNOSIS — R1013 Epigastric pain: Secondary | ICD-10-CM

## 2013-04-04 MED ORDER — ALBUTEROL SULFATE HFA 108 (90 BASE) MCG/ACT IN AERS: 2.0000 | INHALATION_SPRAY | RESPIRATORY_TRACT | Status: DC | PRN

## 2013-04-04 MED ORDER — OXYCODONE-ACETAMINOPHEN 5-325 MG PO TABS: 2.0000 | ORAL_TABLET | Freq: Four times a day (QID) | ORAL | Status: DC | PRN

## 2013-04-04 NOTE — Progress Notes (Signed)
   Subjective:    Patient ID: Jerry Terrell, male    DOB: 11-13-1960, 53 y.o.   MRN: [id number]  HPI  Hx significant for pancreatic pseudocyst, chronic abdominal pain, hepatitis C, and chronic prostatitis. Pt presents with a very convoluted story about management of his pain among his specialists including GI, Oncology, and Pain Specialist. States that sternal pain injections from prior Pain Clinic on "the 6th" (he thinks that it was this month) has exacerbated his abdominal pain.  He is subsequently no longer going to to the Pain Clinic and is awaiting referral from his PCP. States that he now has poor glucose control, elevated blood pressure, numbness in his extremities, and worsened incontinence. States that Pain specialist wanted to "go through my spine" which he does not want to do.  States that the [employer] will not cover the 10/325 Percocet but will cover the 5/325. Due to see the Oncologist ([facility]), GI ([facility]), Hepatologist ([facility]) and Urologist [facility]) in the next several weeks.  States that colonoscopy was cancelled today due to not feeling well and GI docs unsure if he could tolerate the procedure.   Review of Systems  Constitutional: Negative.   HENT: Negative.   Eyes: Negative.   Respiratory: Negative.   Cardiovascular: Negative.   Gastrointestinal: Positive for abdominal pain.  Endocrine: Negative.   Genitourinary:       BPH  Musculoskeletal: Negative.   Skin: Negative.   Neurological: Positive for numbness.       Numbness of fingers and legs.  Hematological: Negative for adenopathy. Does not bruise/bleed easily.  Psychiatric/Behavioral: The patient is nervous/anxious.        Objective:   Physical Exam  Constitutional: He is oriented to person, place, and time. He appears well-developed and well-nourished. No distress.  anxious   HENT:  Head: Normocephalic and atraumatic.  Right Ear: External ear normal.  Left Ear: External ear normal.  Eyes: Conjunctivae and EOM  are normal. Pupils are equal, round, and reactive to light.  Neck: Normal range of motion.  Cardiovascular: Normal rate.   Pulmonary/Chest: Effort normal.  Abdominal: Soft.  Musculoskeletal: He exhibits no edema.  Neurological: He is alert and oriented to person, place, and time.  Skin: Skin is warm and dry.  Psychiatric: His behavior is normal. Judgment and thought content normal. His mood appears anxious. Cognition and memory are normal.  verbous speech          Assessment & Plan:  See separate problem-list charting:

## 2013-04-04 NOTE — Telephone Encounter (Signed)
Ventolin rx called to [facility].

## 2013-04-04 NOTE — Patient Instructions (Addendum)
General Instructions:  We will refill your Percocet to last until your next visit with Dr. Aundra Dubin. Keep your other appointments with your specialist.  Treatment Goals:  Goals (1 Years of Data) as of 04/04/13         As of Today 04/01/13 04/01/13 04/01/13 04/01/13     Blood Pressure    . Blood Pressure < 140/90  142/86 133/76 133/76 116/83 116/83      Progress Toward Treatment Goals:  Treatment Goal 02/16/2013  Blood pressure at goal  Stop smoking smoking less    Self Care Goals & Plans:  Self Care Goal 04/04/2013  Manage my medications take my medicines as prescribed; bring my medications to every visit; refill my medications on time  Monitor my health bring my blood pressure log to each visit  Eat healthy foods drink diet soda or water instead of juice or soda; eat more vegetables; eat foods that are low in salt; eat baked foods instead of fried foods  Be physically active -  Stop smoking -  Meeting treatment goals -    No flowsheet data found.   Care Management & Community Referrals:  Referral 02/16/2013  Referrals made for care management support none needed  Referrals made to community resources none

## 2013-04-04 NOTE — Assessment & Plan Note (Signed)
Refilled Percocet 5/325 mg 2 tabs q6h prn pain #130 tabs no refills, which should be sufficient until f/u with PCP and referral to alternate Pain Clinic.  Narcotic data base consistent with pt medical charting and history.  Last urine drug screen negative. No current red flags. Will update FYI.

## 2013-04-06 NOTE — ED Provider Notes (Signed)
Medical screening examination/treatment/procedure(s) were performed by non-physician practitioner and as supervising physician I was immediately available for consultation/collaboration.   EKG Interpretation   Date/Time:  Saturday April 01 2013 12:00:30 EDT Ventricular Rate:  96 PR Interval:  156 QRS Duration: 92 QT Interval:  322 QTC Calculation: 406 R Axis:   -35 Text Interpretation:  Normal sinus rhythm with sinus arrhythmia Possible  Left atrial enlargement Left axis deviation Septal infarct , age  undetermined Abnormal ECG ED PHYSICIAN INTERPRETATION AVAILABLE IN [place]  [facility] Confirmed by TEST, Record ([id number]) on 04/03/2013 7:24:16 AM        Orpah Greek, MD 04/06/13 [phone]

## 2013-04-06 NOTE — Progress Notes (Signed)
Case discussed with Dr. Michail Sermon soon after the resident saw the patient.  We reviewed the resident's history and exam and pertinent patient test results.  I agree with the assessment, diagnosis and plan of care documented in the resident's note.

## 2013-04-12 ENCOUNTER — Emergency Department (HOSPITAL_COMMUNITY): Payer: Medicaid Other

## 2013-04-12 ENCOUNTER — Encounter (HOSPITAL_COMMUNITY): Payer: Self-pay | Admitting: Emergency Medicine

## 2013-04-12 ENCOUNTER — Emergency Department (HOSPITAL_COMMUNITY)
Admission: EM | Admit: 2013-04-12 | Discharge: 2013-04-13 | Discharge status: Against Medical Advice | Payer: Medicaid Other | Attending: Emergency Medicine | Admitting: Emergency Medicine

## 2013-04-12 DIAGNOSIS — R209 Unspecified disturbances of skin sensation: Secondary | ICD-10-CM | POA: Insufficient documentation

## 2013-04-12 DIAGNOSIS — R002 Palpitations: Secondary | ICD-10-CM | POA: Insufficient documentation

## 2013-04-12 LAB — CBC
HCT: 46.5 % (ref 39.0–52.0)
HEMOGLOBIN: 17 g/dL (ref 13.0–17.0)
MCH: 33.1 pg (ref 26.0–34.0)
MCHC: 36.6 g/dL — ABNORMAL HIGH (ref 30.0–36.0)
MCV: 90.5 fL (ref 78.0–100.0)
Platelets: 243 10*3/uL (ref 150–400)
RBC: 5.14 MIL/uL (ref 4.22–5.81)
RDW: 13.3 % (ref 11.5–15.5)
WBC: 7.4 10*3/uL (ref 4.0–10.5)

## 2013-04-12 LAB — BASIC METABOLIC PANEL
BUN: 12 mg/dL (ref 6–23)
CO2: 28 mEq/L (ref 19–32)
Calcium: 9.7 mg/dL (ref 8.4–10.5)
Chloride: 98 mEq/L (ref 96–112)
Creatinine, Ser: 1.02 mg/dL (ref 0.50–1.35)
GFR, EST NON AFRICAN AMERICAN: 81 mL/min — AB (ref >90)
Glucose, Bld: 121 mg/dL — ABNORMAL HIGH (ref 70–99)
Potassium: 4 mEq/L (ref 3.7–5.3)
SODIUM: 138 meq/L (ref 137–147)

## 2013-04-12 LAB — I-STAT TROPONIN, ED: Troponin i, poc: 0 ng/mL (ref 0.00–0.08)

## 2013-04-12 NOTE — ED Notes (Signed)
Pt c/o palpitations and left sided numbness and tingling in left shoulder and leg with some pain; pt sts issues since having pain injection; no neuro deficits noted

## 2013-04-12 NOTE — ED Notes (Signed)
Pt was very pleasant and understanding of long waits however has obligation to family and needs to go. Will return later.

## 2013-04-13 ENCOUNTER — Encounter (HOSPITAL_COMMUNITY): Payer: Self-pay | Admitting: Emergency Medicine

## 2013-04-13 ENCOUNTER — Other Ambulatory Visit: Payer: Self-pay

## 2013-04-13 ENCOUNTER — Emergency Department (HOSPITAL_COMMUNITY): Payer: Medicaid Other

## 2013-04-13 ENCOUNTER — Emergency Department (HOSPITAL_COMMUNITY)
Admission: EM | Admit: 2013-04-13 | Discharge: 2013-04-13 | Disposition: A | Discharge status: Home / Self Care | Payer: Medicaid Other | Attending: Emergency Medicine | Admitting: Emergency Medicine

## 2013-04-13 DIAGNOSIS — R0789 Other chest pain: Secondary | ICD-10-CM | POA: Insufficient documentation

## 2013-04-13 DIAGNOSIS — Z9889 Other specified postprocedural states: Secondary | ICD-10-CM | POA: Insufficient documentation

## 2013-04-13 DIAGNOSIS — M7989 Other specified soft tissue disorders: Secondary | ICD-10-CM | POA: Insufficient documentation

## 2013-04-13 DIAGNOSIS — R202 Paresthesia of skin: Secondary | ICD-10-CM

## 2013-04-13 DIAGNOSIS — R112 Nausea with vomiting, unspecified: Secondary | ICD-10-CM | POA: Insufficient documentation

## 2013-04-13 DIAGNOSIS — Z862 Personal history of diseases of the blood and blood-forming organs and certain disorders involving the immune mechanism: Secondary | ICD-10-CM | POA: Insufficient documentation

## 2013-04-13 DIAGNOSIS — F17200 Nicotine dependence, unspecified, uncomplicated: Secondary | ICD-10-CM | POA: Insufficient documentation

## 2013-04-13 DIAGNOSIS — R1012 Left upper quadrant pain: Secondary | ICD-10-CM | POA: Insufficient documentation

## 2013-04-13 DIAGNOSIS — G8929 Other chronic pain: Secondary | ICD-10-CM | POA: Insufficient documentation

## 2013-04-13 DIAGNOSIS — J45909 Unspecified asthma, uncomplicated: Secondary | ICD-10-CM | POA: Insufficient documentation

## 2013-04-13 DIAGNOSIS — Z8639 Personal history of other endocrine, nutritional and metabolic disease: Secondary | ICD-10-CM | POA: Insufficient documentation

## 2013-04-13 DIAGNOSIS — Z8719 Personal history of other diseases of the digestive system: Secondary | ICD-10-CM | POA: Insufficient documentation

## 2013-04-13 DIAGNOSIS — R079 Chest pain, unspecified: Secondary | ICD-10-CM

## 2013-04-13 DIAGNOSIS — Z8711 Personal history of peptic ulcer disease: Secondary | ICD-10-CM | POA: Insufficient documentation

## 2013-04-13 DIAGNOSIS — R1011 Right upper quadrant pain: Secondary | ICD-10-CM | POA: Insufficient documentation

## 2013-04-13 DIAGNOSIS — R0602 Shortness of breath: Secondary | ICD-10-CM | POA: Insufficient documentation

## 2013-04-13 DIAGNOSIS — Z79899 Other long term (current) drug therapy: Secondary | ICD-10-CM | POA: Insufficient documentation

## 2013-04-13 DIAGNOSIS — R1013 Epigastric pain: Secondary | ICD-10-CM | POA: Insufficient documentation

## 2013-04-13 DIAGNOSIS — N189 Chronic kidney disease, unspecified: Secondary | ICD-10-CM | POA: Insufficient documentation

## 2013-04-13 DIAGNOSIS — I129 Hypertensive chronic kidney disease with stage 1 through stage 4 chronic kidney disease, or unspecified chronic kidney disease: Secondary | ICD-10-CM | POA: Insufficient documentation

## 2013-04-13 DIAGNOSIS — R0601 Orthopnea: Secondary | ICD-10-CM | POA: Insufficient documentation

## 2013-04-13 DIAGNOSIS — R209 Unspecified disturbances of skin sensation: Secondary | ICD-10-CM | POA: Insufficient documentation

## 2013-04-13 DIAGNOSIS — Z8619 Personal history of other infectious and parasitic diseases: Secondary | ICD-10-CM | POA: Insufficient documentation

## 2013-04-13 DIAGNOSIS — I209 Angina pectoris, unspecified: Secondary | ICD-10-CM | POA: Insufficient documentation

## 2013-04-13 DIAGNOSIS — R51 Headache: Secondary | ICD-10-CM | POA: Insufficient documentation

## 2013-04-13 DIAGNOSIS — R002 Palpitations: Secondary | ICD-10-CM | POA: Insufficient documentation

## 2013-04-13 LAB — LIPASE, BLOOD: Lipase: 51 U/L (ref 11–59)

## 2013-04-13 LAB — COMPREHENSIVE METABOLIC PANEL
ALBUMIN: 4 g/dL (ref 3.5–5.2)
ALT: 42 U/L (ref 0–53)
AST: 26 U/L (ref 0–37)
Alkaline Phosphatase: 123 U/L — ABNORMAL HIGH (ref 39–117)
BUN: 16 mg/dL (ref 6–23)
CALCIUM: 10 mg/dL (ref 8.4–10.5)
CO2: 27 mEq/L (ref 19–32)
Chloride: 98 mEq/L (ref 96–112)
Creatinine, Ser: 1.02 mg/dL (ref 0.50–1.35)
GFR calc non Af Amer: 81 mL/min — ABNORMAL LOW (ref >90)
Glucose, Bld: 112 mg/dL — ABNORMAL HIGH (ref 70–99)
Potassium: 4.1 mEq/L (ref 3.7–5.3)
Sodium: 138 mEq/L (ref 137–147)
TOTAL PROTEIN: 7.8 g/dL (ref 6.0–8.3)
Total Bilirubin: 0.7 mg/dL (ref 0.3–1.2)

## 2013-04-13 LAB — CBC
HCT: 48.1 % (ref 39.0–52.0)
Hemoglobin: 17.4 g/dL — ABNORMAL HIGH (ref 13.0–17.0)
MCH: 32.5 pg (ref 26.0–34.0)
MCHC: 36.2 g/dL — ABNORMAL HIGH (ref 30.0–36.0)
MCV: 89.9 fL (ref 78.0–100.0)
PLATELETS: 257 10*3/uL (ref 150–400)
RBC: 5.35 MIL/uL (ref 4.22–5.81)
RDW: 13.3 % (ref 11.5–15.5)
WBC: 6.6 10*3/uL (ref 4.0–10.5)

## 2013-04-13 LAB — I-STAT TROPONIN, ED: TROPONIN I, POC: 0 ng/mL (ref 0.00–0.08)

## 2013-04-13 MED ORDER — OXYCODONE-ACETAMINOPHEN 5-325 MG PO TABS
2.0000 | ORAL_TABLET | Freq: Once | ORAL | Status: AC
  Administered 2013-04-13: 2 via ORAL
  Filled 2013-04-13: qty 2

## 2013-04-13 NOTE — ED Notes (Signed)
Pt c/o c/o extremity weakness and palpitations since 29th of feb after receiving a shot of steroids and novocain by his pain doctor.  Pt came to ED last night, but wait was too long.

## 2013-04-13 NOTE — Discharge Planning (Signed)
Q7YP Jerry Terrell, Community Liaison  Spoke to patient about following up with his PCP. Patient is a current orange card holder at [facility] where care is established. Educated patient on the importance of utilizing his PCP when possible. My contact information was provided for any future questions or concerns.

## 2013-04-13 NOTE — Discharge Instructions (Signed)
Make follow up appointment in 2 days with Neurology as listed above for outpatient MRI of brain and further evaluation of numbness/tingling of left arm.  Make follow up appointment with your doctor for further evaluation of palpitations. Recommend Holter monitor.   Continue your current medications as directed. Return to ED if you develop progressively worsening chest pain or shortness of breath.    Chest Pain (Nonspecific) It is often hard to give a specific diagnosis for the cause of chest pain. There is always a chance that your pain could be related to something serious, such as a heart attack or a blood clot in the lungs. You need to follow up with your caregiver for further evaluation. CAUSES   Heartburn.  Pneumonia or bronchitis.  Anxiety or stress.  Inflammation around your heart (pericarditis) or lung (pleuritis or pleurisy).  A blood clot in the lung.  A collapsed lung (pneumothorax). It can develop suddenly on its own (spontaneous pneumothorax) or from injury (trauma) to the chest.  Shingles infection (herpes zoster virus). The chest wall is composed of bones, muscles, and cartilage. Any of these can be the source of the pain.  The bones can be bruised by injury.  The muscles or cartilage can be strained by coughing or overwork.  The cartilage can be affected by inflammation and become sore (costochondritis). DIAGNOSIS  Lab tests or other studies, such as X-rays, electrocardiography, stress testing, or cardiac imaging, may be needed to find the cause of your pain.  TREATMENT   Treatment depends on what may be causing your chest pain. Treatment may include:  Acid blockers for heartburn.  Anti-inflammatory medicine.  Pain medicine for inflammatory conditions.  Antibiotics if an infection is present.  You may be advised to change lifestyle habits. This includes stopping smoking and avoiding alcohol, caffeine, and chocolate.  You may be advised to keep your head  raised (elevated) when sleeping. This reduces the chance of acid going backward from your stomach into your esophagus.  Most of the time, nonspecific chest pain will improve within 2 to 3 days with rest and mild pain medicine. HOME CARE INSTRUCTIONS   If antibiotics were prescribed, take your antibiotics as directed. Finish them even if you start to feel better.  For the next few days, avoid physical activities that bring on chest pain. Continue physical activities as directed.  Do not smoke.  Avoid drinking alcohol.  Only take over-the-counter or prescription medicine for pain, discomfort, or fever as directed by your caregiver.  Follow your caregiver's suggestions for further testing if your chest pain does not go away.  Keep any follow-up appointments you made. If you do not go to an appointment, you could develop lasting (chronic) problems with pain. If there is any problem keeping an appointment, you must call to reschedule. SEEK MEDICAL CARE IF:   You think you are having problems from the medicine you are taking. Read your medicine instructions carefully.  Your chest pain does not go away, even after treatment.  You develop a rash with blisters on your chest. SEEK IMMEDIATE MEDICAL CARE IF:   You have increased chest pain or pain that spreads to your arm, neck, jaw, back, or abdomen.  You develop shortness of breath, an increasing cough, or you are coughing up blood.  You have severe back or abdominal pain, feel nauseous, or vomit.  You develop severe weakness, fainting, or chills.  You have a fever. THIS IS AN EMERGENCY. Do not wait to see if the  pain will go away. Get medical help at once. Call your local emergency services (911 in [place] Do not drive yourself to the hospital. MAKE SURE YOU:   Understand these instructions.  Will watch your condition.  Will get help right away if you are not doing well or get worse. Document Released: 10/15/2004 Document Revised:  03/30/2011 Document Reviewed: 08/11/2007 [facility] Patient Information 2014 [place].

## 2013-04-13 NOTE — ED Notes (Signed)
Patient missed ten minute protocol.  (nurses aware of patient)..(i was with another  patient doing ekg)
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
## 2013-04-13 NOTE — ED Notes (Signed)
Pt has not returned. left ED at 1927.

## 2013-04-15 NOTE — ED Provider Notes (Addendum)
Medical screening examination/treatment/procedure(s) were conducted as a shared visit with non-physician practitioner(s) and myself.  I personally evaluated the patient during the encounter.   EKG Interpretation None      Date: 04/13/2013  Rate: 91  Rhythm: normal sinus rhythm  QRS Axis: normal  Intervals: normal  ST/T Wave abnormalities: nonspecific ST changes  Conduction Disutrbances:none  Narrative Interpretation:   Old EKG Reviewed: unchanged    Jerry Terrell is a 53 y.o. male here with palpitations, L arm numbness for several weeks. He had celiac nerve block several weeks ago and has intermittent symptoms since then. Came yesterday, labs including trop neg but didn't see doctor. On exam, he appeared anxious. EKG unremarkable. Some L arm dysmetria and slight pronator drift. Otherwise nl neuro exam. Nl cardiac and lung exam. Patient not tachycardic. CT head nl. I called Dr. Aram Beecham who feels that patient is unlikely having a stroke and can get outpatient neuro f/u. Trop neg today and symptoms for weeks. Will have him f/u with PMD to get holter for further evaluation.    Wandra Arthurs, MD 04/15/13 0700  Wandra Arthurs, MD 04/15/13 [phone]

## 2013-04-19 ENCOUNTER — Ambulatory Visit: Payer: Self-pay | Admitting: Internal Medicine

## 2013-04-20 ENCOUNTER — Encounter: Payer: Self-pay | Admitting: Internal Medicine

## 2013-04-20 ENCOUNTER — Ambulatory Visit (INDEPENDENT_AMBULATORY_CARE_PROVIDER_SITE_OTHER): Payer: No Typology Code available for payment source | Admitting: Internal Medicine

## 2013-04-20 VITALS — BP 132/87 | HR 87 | Temp 98.5°F | Ht 68.5 in | Wt 163.4 lb

## 2013-04-20 DIAGNOSIS — R1013 Epigastric pain: Secondary | ICD-10-CM

## 2013-04-20 DIAGNOSIS — I10 Essential (primary) hypertension: Secondary | ICD-10-CM

## 2013-04-20 DIAGNOSIS — F17200 Nicotine dependence, unspecified, uncomplicated: Secondary | ICD-10-CM

## 2013-04-20 DIAGNOSIS — J45909 Unspecified asthma, uncomplicated: Secondary | ICD-10-CM

## 2013-04-20 DIAGNOSIS — G8929 Other chronic pain: Secondary | ICD-10-CM

## 2013-04-20 MED ORDER — ALBUTEROL SULFATE HFA 108 (90 BASE) MCG/ACT IN AERS: 2.0000 | INHALATION_SPRAY | RESPIRATORY_TRACT | Status: DC | PRN

## 2013-04-20 NOTE — Progress Notes (Signed)
Subjective:    Patient ID: Jerry Terrell, male    DOB: Jan 19, 1961, 53 y.o.   MRN: [id number]  HPI Comments:  53 y.o male Past Medical History asthma, CAD, Bronchitis, h/o THC+ UDS, H/o smoking, chronic h/a, HCV + (follows with [place] who may be putting him on a new drug soon), Chronic pain (back, abdomen), Hypertension (BP 132/87 today),GERD (gastroesophageal reflux disease), chronic prostatitis (follows w/ urology at [facility]), H/O hiatal hernia, h/o chronic pancreatitis, History of stomach ulcers, h/o pancreatitis, h/o fatty liver, Pancreatic cyst (being followed by GI and surgical oncology at [facility]), GAD, simple cyst right kidney, mixed urinary incontinence.    1)He presents for follow up for chronic abdominal pian and request Rx refill of narcotic medication (Percocet).  He has multiple visits to the ED for the formentioned reason.  He has a history of pancreatic pseudocyst followed by GI and surgical oncology (Dr. Crisoforo Oxford) at [facility].  He had a recent imaging with size of pancreatic pseudocyst 1.9 x 1.3 cm.  He will have a US abdomen 05/19/13 and an MRI 08/01/13. He was just given Percocet 5-325 mg 1-2 tab q 6 #130 on 04/04/13 in this clinic and has had multiple ED visit for abdominal pain related pancreatitic pseudocysts.  Having referred him to Dr. Ardis Hughs (GI doctor in [place]) and Dr. Delrae Alfred (GI doctor at [facility]) both in the past have stated pancreatitic pseudocyst should not cause pain.  He states today pain is dull to sharp radiates in the LUQ and RUQ. Pain is 8.5-9/10 assoc. With nausea and decreased po intake intermittently.  He is out of pain medications after taking the last two pills today of those pills given on 04/04/13 in this clinic by another provider. He was taking 2 pills every 4-6 hours but states he needs them every 4 hours.  In the past I referred him to a pain clinic ([facility]) due to the fact that two specialists mentioned did not feel like pseudocysts should cause pain and the  fact that I did not feel comfortable based on this information prescribing long tem narcotics with h/o THC+. I continued to to tell patient at each visit that I would NOT be prescribing narcotics long term.  The pain clinic he was referred to did not want to prescribe narcotics due to history of THC +UDS.  The pain clinic wanted to try alternative means to treat his pain and sent Jerry Terrell for neuropsychological testing.  On 03/15/13 he was given trigger point injections (of Bupivacaine and triamcinolone) in his abdomen at least x4 as an alternative means to narcotics. After the injections he had a ED visits for worsening ab pain, chest pain (w/u was negative at that time with neg trop), palpitations, elevated BP, numbness in his b/l legs and left upper arm.  Today he walks with a cane which is new since the last time I saw him.  He states the numbness in his extremities is improved but not completely resolved.  He states after the trigger point injections someone told him he could have died and so he does not want to go to this pain clinic any longer.  He also states he has gained weight since the series of events since the injections which is good because he had lost weight.  He had a head CT in 3/36/2015 which was negative. ED provider stated that he should follow up with outpatient Neurology with possible MRI for numbness and he was given the info.  2)He is trying to get disability and talking to lawyers at this time but has been denied in the past at least once.   3)He needs Rx refill of Albuterol.     ZO:XWRUEAV 1ppd     WU:[id number] on hold patient will reschedule          Review of Systems  Cardiovascular: Negative for chest pain and palpitations.  Gastrointestinal: Positive for nausea.  Musculoskeletal: Negative for back pain.  Neurological: Positive for numbness.       Objective:   Physical Exam  Nursing note and vitals reviewed. Constitutional: He is oriented to  person, place, and time. Vital signs are normal. He appears well-developed and well-nourished. He is cooperative.  HENT:  Head: Normocephalic and atraumatic.  Mouth/Throat: Oropharynx is clear and moist and mucous membranes are normal.  Eyes: Conjunctivae are normal. Right eye exhibits no discharge. Left eye exhibits no discharge. No scleral icterus.  Cardiovascular: Normal rate, regular rhythm and normal heart sounds.   No murmur heard. Pulmonary/Chest: Effort normal and breath sounds normal.  Abdominal: Soft. Bowel sounds are normal. He exhibits no distension. There is tenderness in the left upper quadrant.  Musculoskeletal: He exhibits no edema.  Neurological: He is alert and oriented to person, place, and time. He has normal strength. No sensory deficit.  CN 2-12 intact Walking with cane today  Skin: Skin is warm and dry. No rash noted.  Psychiatric: His speech is normal and behavior is normal. Judgment and thought content normal. His mood appears anxious. Cognition and memory are normal.  Pt anxious and fixated on getting pain medication.  He is frustrated though still pleasant and persistent when pain medication denied          Assessment & Plan:

## 2013-04-20 NOTE — Patient Instructions (Signed)
General Instructions: Please follow up with the pain clinic we will try to get you an appointment.  Please follow up with [facility] gastroenterology/surgical oncology See you as needed I will try to refer you outpatient to neurology if your symptoms of numbness keep occuring in your left upper arm and leg.   Treatment Goals:  Goals (1 Years of Data) as of 04/20/13         As of Today 04/13/13 04/13/13 04/13/13 04/13/13     Blood Pressure    . Blood Pressure < 140/90  132/87 121/65 134/82 145/93 129/79      Progress Toward Treatment Goals:  Treatment Goal 04/20/2013  Blood pressure at goal  Stop smoking smoking the same amount    Self Care Goals & Plans:  Self Care Goal 04/20/2013  Manage my medications take my medicines as prescribed  Monitor my health keep track of my blood pressure  Eat healthy foods drink diet soda or water instead of juice or soda; eat more vegetables; eat foods that are low in salt; eat baked foods instead of fried foods; eat fruit for snacks and desserts; eat smaller portions  Be physically active find an activity I enjoy  Stop smoking -  Meeting treatment goals maintain the current self-care plan    No flowsheet data found.   Care Management & Community Referrals:  Referral 04/20/2013  Referrals made for care management support none needed  Referrals made to community resources none       Chronic Pain Chronic pain can be defined as pain that is off and on and lasts for 3 6 months or longer. Many things cause chronic pain, which can make it difficult to make a diagnosis. There are many treatment options available for chronic pain. However, finding a treatment that works well for you may require trying various approaches until the right one is found. Many people benefit from a combination of two or more types of treatment to control their pain. SYMPTOMS  Chronic pain can occur anywhere in the body and can range from mild to very severe. Some types of chronic  pain include:  Headache.  Low back pain.  Cancer pain.  Arthritis pain.  Neurogenic pain. This is pain resulting from damage to nerves. People with chronic pain may also have other symptoms such as:  Depression.  Anger.  Insomnia.  Anxiety. DIAGNOSIS  Your health care provider will help diagnose your condition over time. In many cases, the initial focus will be on excluding possible conditions that could be causing the pain. Depending on your symptoms, your health care provider may order tests to diagnose your condition. Some of these tests may include:   Blood tests.   CT scan.   MRI.   X-rays.   Ultrasounds.   Nerve conduction studies.  You may need to see a specialist.  TREATMENT  Finding treatment that works well may take time. You may be referred to a pain specialist. He or she may prescribe medicine or therapies, such as:   Mindful meditation or yoga.  Shots (injections) of numbing or pain-relieving medicines into the spine or area of pain.  Local electrical stimulation.  Acupuncture.   Massage therapy.   Aroma, color, light, or sound therapy.   Biofeedback.   Working with a physical therapist to keep from getting stiff.   Regular, gentle exercise.   Cognitive or behavioral therapy.   Group support.  Sometimes, surgery may be recommended.  HOME CARE INSTRUCTIONS   Take  all medicines as directed by your health care provider.   Lessen stress in your life by relaxing and doing things such as listening to calming music.   Exercise or be active as directed by your health care provider.   Eat a healthy diet and include things such as vegetables, fruits, fish, and lean meats in your diet.   Keep all follow-up appointments with your health care provider.   Attend a support group with others suffering from chronic pain. SEEK MEDICAL CARE IF:   Your pain gets worse.   You develop a new pain that was not there before.    You cannot tolerate medicines given to you by your health care provider.   You have new symptoms since your last visit with your health care provider.  SEEK IMMEDIATE MEDICAL CARE IF:   You feel weak.   You have decreased sensation or numbness.   You lose control of bowel or bladder function.   Your pain suddenly gets much worse.   You develop shaking.  You develop chills.  You develop confusion.  You develop chest pain.  You develop shortness of breath.  MAKE SURE YOU:  Understand these instructions.  Will watch your condition.  Will get help right away if you are not doing well or get worse. Document Released: 09/27/2001 Document Revised: 09/07/2012 Document Reviewed: 07/01/2012 [facility] Patient Information 2014 [place].  Hypertension As your heart beats, it forces blood through your arteries. This force is your blood pressure. If the pressure is too high, it is called hypertension (HTN) or high blood pressure. HTN is dangerous because you may have it and not know it. High blood pressure may mean that your heart has to work harder to pump blood. Your arteries may be narrow or stiff. The extra work puts you at risk for heart disease, stroke, and other problems.  Blood pressure consists of two numbers, a higher number over a lower, 110/72, for example. It is stated as "110 over 72." The ideal is below 120 for the top number (systolic) and under 80 for the bottom (diastolic). Write down your blood pressure today. You should pay close attention to your blood pressure if you have certain conditions such as:  Heart failure.  Prior heart attack.  Diabetes  Chronic kidney disease.  Prior stroke.  Multiple risk factors for heart disease. To see if you have HTN, your blood pressure should be measured while you are seated with your arm held at the level of the heart. It should be measured at least twice. A one-time elevated blood pressure reading (especially  in the Emergency Department) does not mean that you need treatment. There may be conditions in which the blood pressure is different between your right and left arms. It is important to see your caregiver soon for a recheck. Most people have essential hypertension which means that there is not a specific cause. This type of high blood pressure may be lowered by changing lifestyle factors such as:  Stress.  Smoking.  Lack of exercise.  Excessive weight.  Drug/tobacco/alcohol use.  Eating less salt. Most people do not have symptoms from high blood pressure until it has caused damage to the body. Effective treatment can often prevent, delay or reduce that damage. TREATMENT  When a cause has been identified, treatment for high blood pressure is directed at the cause. There are a large number of medications to treat HTN. These fall into several categories, and your caregiver will help you select  the medicines that are best for you. Medications may have side effects. You should review side effects with your caregiver. If your blood pressure stays high after you have made lifestyle changes or started on medicines,   Your medication(s) may need to be changed.  Other problems may need to be addressed.  Be certain you understand your prescriptions, and know how and when to take your medicine.  Be sure to follow up with your caregiver within the time frame advised (usually within two weeks) to have your blood pressure rechecked and to review your medications.  If you are taking more than one medicine to lower your blood pressure, make sure you know how and at what times they should be taken. Taking two medicines at the same time can result in blood pressure that is too low. SEEK IMMEDIATE MEDICAL CARE IF:  You develop a severe headache, blurred or changing vision, or confusion.  You have unusual weakness or numbness, or a faint feeling.  You have severe chest or abdominal pain, vomiting, or  breathing problems. MAKE SURE YOU:   Understand these instructions.  Will watch your condition.  Will get help right away if you are not doing well or get worse. Document Released: 01/05/2005 Document Revised: 03/30/2011 Document Reviewed: 08/26/2007 [facility] Patient Information 2014 [place].  Smoking Cessation Quitting smoking is important to your health and has many advantages. However, it is not always easy to quit since nicotine is a very addictive drug. Often times, people try 3 times or more before being able to quit. This document explains the best ways for you to prepare to quit smoking. Quitting takes hard work and a lot of effort, but you can do it. ADVANTAGES OF QUITTING SMOKING  You will live longer, feel better, and live better.  Your body will feel the impact of quitting smoking almost immediately.  Within 20 minutes, blood pressure decreases. Your pulse returns to its normal level.  After 8 hours, carbon monoxide levels in the blood return to normal. Your oxygen level increases.  After 24 hours, the chance of having a heart attack starts to decrease. Your breath, hair, and body stop smelling like smoke.  After 48 hours, damaged nerve endings begin to recover. Your sense of taste and smell improve.  After 72 hours, the body is virtually free of nicotine. Your bronchial tubes relax and breathing becomes easier.  After 2 to 12 weeks, lungs can hold more air. Exercise becomes easier and circulation improves.  The risk of having a heart attack, stroke, cancer, or lung disease is greatly reduced.  After 1 year, the risk of coronary heart disease is cut in half.  After 5 years, the risk of stroke falls to the same as a nonsmoker.  After 10 years, the risk of lung cancer is cut in half and the risk of other cancers decreases significantly.  After 15 years, the risk of coronary heart disease drops, usually to the level of a nonsmoker.  If you are pregnant,  quitting smoking will improve your chances of having a healthy baby.  The people you live with, especially any children, will be healthier.  You will have extra money to spend on things other than cigarettes. QUESTIONS TO THINK ABOUT BEFORE ATTEMPTING TO QUIT You may want to talk about your answers with your caregiver.  Why do you want to quit?  If you tried to quit in the past, what helped and what did not?  What will be the most  difficult situations for you after you quit? How will you plan to handle them?  Who can help you through the tough times? Your family? Friends? A caregiver?  What pleasures do you get from smoking? What ways can you still get pleasure if you quit? Here are some questions to ask your caregiver:  How can you help me to be successful at quitting?  What medicine do you think would be best for me and how should I take it?  What should I do if I need more help?  What is smoking withdrawal like? How can I get information on withdrawal? GET READY  Set a quit date.  Change your environment by getting rid of all cigarettes, ashtrays, matches, and lighters in your home, car, or work. Do not let people smoke in your home.  Review your past attempts to quit. Think about what worked and what did not. GET SUPPORT AND ENCOURAGEMENT You have a better chance of being successful if you have help. You can get support in many ways.  Tell your family, friends, and co-workers that you are going to quit and need their support. Ask them not to smoke around you.  Get individual, group, or telephone counseling and support. Programs are available at [employer] and health centers. Call your local health department for information about programs in your area.  Spiritual beliefs and practices may help some smokers quit.  Download a "quit meter" on your computer to keep track of quit statistics, such as how long you have gone without smoking, cigarettes not smoked, and money  saved.  Get a self-help book about quitting smoking and staying off of tobacco. [place] yourself from urges to smoke. Talk to someone, go for a walk, or occupy your time with a task.  Change your normal routine. Take a different route to work. Drink tea instead of coffee. Eat breakfast in a different place.  Reduce your stress. Take a hot bath, exercise, or read a book.  Plan something enjoyable to do every day. Reward yourself for not smoking.  Explore interactive web-based programs that specialize in helping you quit. GET MEDICINE AND USE IT CORRECTLY Medicines can help you stop smoking and decrease the urge to smoke. Combining medicine with the above behavioral methods and support can greatly increase your chances of successfully quitting smoking.  Nicotine replacement therapy helps deliver nicotine to your body without the negative effects and risks of smoking. Nicotine replacement therapy includes nicotine gum, lozenges, inhalers, nasal sprays, and skin patches. Some may be available over-the-counter and others require a prescription.  Antidepressant medicine helps people abstain from smoking, but how this works is unknown. This medicine is available by prescription.  Nicotinic receptor partial agonist medicine simulates the effect of nicotine in your brain. This medicine is available by prescription. Ask your caregiver for advice about which medicines to use and how to use them based on your health history. Your caregiver will tell you what side effects to look out for if you choose to be on a medicine or therapy. Carefully read the information on the package. Do not use any other product containing nicotine while using a nicotine replacement product.  RELAPSE OR DIFFICULT SITUATIONS Most relapses occur within the first 3 months after quitting. Do not be discouraged if you start smoking again. Remember, most people try several times before finally  quitting. You may have symptoms of withdrawal because your body is used to nicotine. You may crave  cigarettes, be irritable, feel very hungry, cough often, get headaches, or have difficulty concentrating. The withdrawal symptoms are only temporary. They are strongest when you first quit, but they will go away within 10 14 days. To reduce the chances of relapse, try to:  Avoid drinking alcohol. Drinking lowers your chances of successfully quitting.  Reduce the amount of caffeine you consume. Once you quit smoking, the amount of caffeine in your body increases and can give you symptoms, such as a rapid heartbeat, sweating, and anxiety.  Avoid smokers because they can make you want to smoke.  Do not let weight gain distract you. Many smokers will gain weight when they quit, usually less than 10 pounds. Eat a healthy diet and stay active. You can always lose the weight gained after you quit.  Find ways to improve your mood other than smoking. FOR MORE INFORMATION  www.smokefree.gov  Document Released: 12/30/2000 Document Revised: 07/07/2011 Document Reviewed: 04/16/2011 [facility] Patient Information 2014 [place], [place].

## 2013-04-22 NOTE — Assessment & Plan Note (Signed)
Rx refill Albuterol today  

## 2013-04-22 NOTE — Assessment & Plan Note (Signed)
  Assessment: Progress toward smoking cessation:  smoking the same amount Barriers to progress toward smoking cessation:  stress Comments: none  Plan: Instruction/counseling given:  I counseled patient on the dangers of tobacco use, advised patient to stop smoking, and reviewed strategies to maximize success. Educational resources provided:  other (see comments) Self management tools provided:  other (see comments) Medications to assist with smoking cessation:  None Patient agreed to the following self-care plans for smoking cessation: No agreement at this visit Other plans: keep encouraging cessation

## 2013-04-22 NOTE — Assessment & Plan Note (Signed)
BP Readings from Last 3 Encounters:  04/20/13 132/87  04/13/13 121/65  04/12/13 138/86    Lab Results  Component Value Date   NA 138 04/13/2013   K 4.1 04/13/2013   CREATININE 1.02 04/13/2013    Assessment: Blood pressure control: controlled Progress toward BP goal:  at goal Comments: none  Plan: Medications:  continue current medications Educational resources provided: other (see comments) Self management tools provided: other (see comments) Other plans: f/u in 3-6 months

## 2013-04-22 NOTE — Progress Notes (Signed)
INTERNAL MEDICINE TEACHING ATTENDING ADDENDUM - Nischal Narendra, MD: I reviewed and discussed at the time of visit with the resident Dr. McLean, the patient's medical history, physical examination, diagnosis and results of tests and treatment and I agree with the patient's care as documented. 

## 2013-04-22 NOTE — Assessment & Plan Note (Addendum)
See my HPI.  I spent 1.5 hours with this patient.  He is frustrated with the medical care he is receiving.  I advised him (like on previous visits) since I am not a specialist in pancreatic pseduocysts and I am not certain that pseudocyst can cause abdominal pain based on speaking with two previous consultants that I do not feel comfortable prescribing long term narcotics.  Also he has h/o UDS + THC.  He still wanted me to prescribe narcotics and was persistent though this is not the first time I made him aware that I will not be prescribing long term narcotics.  I previously referred him to a pain clinic but he no longer wants to go to this pain clinic.  I will refer him to another pain clinic and rec. He return to [facility] GI and surgical oncology with f/u imaging.  If these specialties deem he should be on long term narcotics he can get narcotics from either or find another primary care provider.  I advised the patient that I will be happy to treat any other general medical condition at his clinic visits. His most recent lipase on 04/13/13 was normal so I do not think he is having an acute on chronic pancreatis flare at this time.  I considered alternatives such as Tramadol which is listed in his allergies.  Due to his HCV ab+ it would be best to avoid Tylenol as well.

## 2013-04-26 ENCOUNTER — Telehealth: Payer: Self-pay | Admitting: *Deleted

## 2013-04-26 NOTE — Telephone Encounter (Signed)
Jerry Terrell came to office today to complain to me about not being treated for chronic pain when all the ED doctors are telling him he should be on oral medication for pain control. High ED use.  He produced a lot of chart notes, referrals, and letters and his entire Hx (which I allowed), verbatim to Jerry Terrell notes, which honestly I have been told a few times already. He refuses going back to the pain clinic that gave him the injections that "could kill him" this time from the ED doc. This is what Jerry Terrell would like to manage his pain since I also stated the basis for Jerry Terrell not prescribing narcotics currently: -referral to another pain clinic -referral to neurology (he had a letter [id number]) f/u from ED in 2 days -Oral pain control until he can get the above He stressed the ED docs wanting him to get oral med's (when they continue giving him hydromorphone-when he stated it helps, I stated of course it helps you won't feel anything). I gave him my business card and asked that if he returns to the ED and they are as insistent as he states, that they give me a call to discuss. I suggested he consider another  Clinic to receive his care if Jerry Terrell can not meet his needs. Jerry Terrell also accepts the orange card. He was not interested. On his way out he brought up seeing psych. And feeling depressed, but he would never hurt himself.

## 2013-04-28 ENCOUNTER — Other Ambulatory Visit: Payer: Self-pay | Admitting: Internal Medicine

## 2013-04-28 DIAGNOSIS — R202 Paresthesia of skin: Principal | ICD-10-CM

## 2013-04-28 DIAGNOSIS — R200 Anesthesia of skin: Secondary | ICD-10-CM

## 2013-04-30 ENCOUNTER — Other Ambulatory Visit: Payer: Self-pay

## 2013-04-30 ENCOUNTER — Encounter (HOSPITAL_COMMUNITY): Payer: Self-pay | Admitting: Emergency Medicine

## 2013-04-30 ENCOUNTER — Emergency Department (HOSPITAL_COMMUNITY)
Admission: EM | Admit: 2013-04-30 | Discharge: 2013-04-30 | Disposition: A | Discharge status: Home / Self Care | Payer: Medicaid Other | Attending: Emergency Medicine | Admitting: Emergency Medicine

## 2013-04-30 DIAGNOSIS — Z9889 Other specified postprocedural states: Secondary | ICD-10-CM | POA: Insufficient documentation

## 2013-04-30 DIAGNOSIS — I129 Hypertensive chronic kidney disease with stage 1 through stage 4 chronic kidney disease, or unspecified chronic kidney disease: Secondary | ICD-10-CM | POA: Insufficient documentation

## 2013-04-30 DIAGNOSIS — Z8639 Personal history of other endocrine, nutritional and metabolic disease: Secondary | ICD-10-CM | POA: Insufficient documentation

## 2013-04-30 DIAGNOSIS — N189 Chronic kidney disease, unspecified: Secondary | ICD-10-CM | POA: Insufficient documentation

## 2013-04-30 DIAGNOSIS — Z8619 Personal history of other infectious and parasitic diseases: Secondary | ICD-10-CM | POA: Insufficient documentation

## 2013-04-30 DIAGNOSIS — R1013 Epigastric pain: Secondary | ICD-10-CM | POA: Insufficient documentation

## 2013-04-30 DIAGNOSIS — J45909 Unspecified asthma, uncomplicated: Secondary | ICD-10-CM | POA: Insufficient documentation

## 2013-04-30 DIAGNOSIS — G8929 Other chronic pain: Secondary | ICD-10-CM

## 2013-04-30 DIAGNOSIS — I209 Angina pectoris, unspecified: Secondary | ICD-10-CM | POA: Insufficient documentation

## 2013-04-30 DIAGNOSIS — Z79899 Other long term (current) drug therapy: Secondary | ICD-10-CM | POA: Insufficient documentation

## 2013-04-30 DIAGNOSIS — Z8719 Personal history of other diseases of the digestive system: Secondary | ICD-10-CM | POA: Insufficient documentation

## 2013-04-30 DIAGNOSIS — R1012 Left upper quadrant pain: Secondary | ICD-10-CM | POA: Insufficient documentation

## 2013-04-30 DIAGNOSIS — G43909 Migraine, unspecified, not intractable, without status migrainosus: Secondary | ICD-10-CM | POA: Insufficient documentation

## 2013-04-30 DIAGNOSIS — M79609 Pain in unspecified limb: Secondary | ICD-10-CM | POA: Insufficient documentation

## 2013-04-30 DIAGNOSIS — F17200 Nicotine dependence, unspecified, uncomplicated: Secondary | ICD-10-CM | POA: Insufficient documentation

## 2013-04-30 DIAGNOSIS — K219 Gastro-esophageal reflux disease without esophagitis: Secondary | ICD-10-CM | POA: Insufficient documentation

## 2013-04-30 DIAGNOSIS — N419 Inflammatory disease of prostate, unspecified: Secondary | ICD-10-CM | POA: Insufficient documentation

## 2013-04-30 DIAGNOSIS — Z862 Personal history of diseases of the blood and blood-forming organs and certain disorders involving the immune mechanism: Secondary | ICD-10-CM | POA: Insufficient documentation

## 2013-04-30 LAB — URINE MICROSCOPIC-ADD ON

## 2013-04-30 LAB — CBC WITH DIFFERENTIAL/PLATELET
Basophils Absolute: 0 10*3/uL (ref 0.0–0.1)
Basophils Relative: 0 % (ref 0–1)
EOS PCT: 1 % (ref 0–5)
Eosinophils Absolute: 0.1 10*3/uL (ref 0.0–0.7)
HEMATOCRIT: 47.5 % (ref 39.0–52.0)
Hemoglobin: 17.2 g/dL — ABNORMAL HIGH (ref 13.0–17.0)
LYMPHS ABS: 2.3 10*3/uL (ref 0.7–4.0)
Lymphocytes Relative: 43 % (ref 12–46)
MCH: 32.4 pg (ref 26.0–34.0)
MCHC: 36.2 g/dL — ABNORMAL HIGH (ref 30.0–36.0)
MCV: 89.5 fL (ref 78.0–100.0)
Monocytes Absolute: 0.5 10*3/uL (ref 0.1–1.0)
Monocytes Relative: 10 % (ref 3–12)
NEUTROS ABS: 2.4 10*3/uL (ref 1.7–7.7)
Neutrophils Relative %: 46 % (ref 43–77)
Platelets: 276 10*3/uL (ref 150–400)
RBC: 5.31 MIL/uL (ref 4.22–5.81)
RDW: 12.9 % (ref 11.5–15.5)
WBC: 5.4 10*3/uL (ref 4.0–10.5)

## 2013-04-30 LAB — COMPREHENSIVE METABOLIC PANEL
ALK PHOS: 109 U/L (ref 39–117)
ALT: 50 U/L (ref 0–53)
AST: 29 U/L (ref 0–37)
Albumin: 3.9 g/dL (ref 3.5–5.2)
BILIRUBIN TOTAL: 1.1 mg/dL (ref 0.3–1.2)
BUN: 15 mg/dL (ref 6–23)
CALCIUM: 9.8 mg/dL (ref 8.4–10.5)
CHLORIDE: 96 meq/L (ref 96–112)
CO2: 25 meq/L (ref 19–32)
Creatinine, Ser: 0.94 mg/dL (ref 0.50–1.35)
GFR calc Af Amer: >90 mL/min (ref >90)
GLUCOSE: 137 mg/dL — AB (ref 70–99)
Potassium: 3.4 mEq/L — ABNORMAL LOW (ref 3.7–5.3)
SODIUM: 138 meq/L (ref 137–147)
Total Protein: 7.6 g/dL (ref 6.0–8.3)

## 2013-04-30 LAB — URINALYSIS, ROUTINE W REFLEX MICROSCOPIC
BILIRUBIN URINE: NEGATIVE
GLUCOSE, UA: NEGATIVE mg/dL
KETONES UR: NEGATIVE mg/dL
Leukocytes, UA: NEGATIVE
Nitrite: NEGATIVE
Protein, ur: NEGATIVE mg/dL
Specific Gravity, Urine: 1.01 (ref 1.005–1.030)
UROBILINOGEN UA: 0.2 mg/dL (ref 0.0–1.0)
pH: 6 (ref 5.0–8.0)

## 2013-04-30 LAB — LIPASE, BLOOD: Lipase: 82 U/L — ABNORMAL HIGH (ref 11–59)

## 2013-04-30 LAB — I-STAT CG4 LACTIC ACID, ED: Lactic Acid, Venous: 1.93 mmol/L (ref 0.5–2.2)

## 2013-04-30 MED ORDER — ONDANSETRON HCL 4 MG/2ML IJ SOLN
4.0000 mg | Freq: Once | INTRAMUSCULAR | Status: AC
  Administered 2013-04-30: 4 mg via INTRAVENOUS
  Filled 2013-04-30: qty 2

## 2013-04-30 MED ORDER — MORPHINE SULFATE 4 MG/ML IJ SOLN
4.0000 mg | Freq: Once | INTRAMUSCULAR | Status: AC
  Administered 2013-04-30: 4 mg via INTRAVENOUS
  Filled 2013-04-30: qty 1

## 2013-04-30 MED ORDER — SODIUM CHLORIDE 0.9 % IV BOLUS (SEPSIS)
1000.0000 mL | Freq: Once | INTRAVENOUS | Status: AC
  Administered 2013-04-30: 1000 mL via INTRAVENOUS

## 2013-04-30 MED ORDER — MORPHINE SULFATE 4 MG/ML IJ SOLN
6.0000 mg | Freq: Once | INTRAMUSCULAR | Status: AC
  Administered 2013-04-30: 6 mg via INTRAVENOUS
  Filled 2013-04-30: qty 2

## 2013-04-30 NOTE — ED Notes (Signed)
Pt verbalized understanding of discharge instructions and follow up care provided in the discharge.

## 2013-04-30 NOTE — ED Notes (Signed)
PA Bowie at bedside  °
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
## 2013-04-30 NOTE — Discharge Instructions (Signed)
Please follow up closely with your doctor for further management of your abdominal pain. Eat bland food.  Avoid any alcohol or fatty food as it may aggravate your abdominal pain.    Abdominal Pain, Adult Many things can cause abdominal pain. Usually, abdominal pain is not caused by a disease and will improve without treatment. It can often be observed and treated at home. Your health care provider will do a physical exam and possibly order blood tests and X-rays to help determine the seriousness of your pain. However, in many cases, more time must pass before a clear cause of the pain can be found. Before that point, your health care provider may not know if you need more testing or further treatment. HOME CARE INSTRUCTIONS  Monitor your abdominal pain for any changes. The following actions may help to alleviate any discomfort you are experiencing:  Only take over-the-counter or prescription medicines as directed by your health care provider.  Do not take laxatives unless directed to do so by your health care provider.  Try a clear liquid diet (broth, tea, or water) as directed by your health care provider. Slowly move to a bland diet as tolerated. SEEK MEDICAL CARE IF:  You have unexplained abdominal pain.  You have abdominal pain associated with nausea or diarrhea.  You have pain when you urinate or have a bowel movement.  You experience abdominal pain that wakes you in the night.  You have abdominal pain that is worsened or improved by eating food.  You have abdominal pain that is worsened with eating fatty foods. SEEK IMMEDIATE MEDICAL CARE IF:   Your pain does not go away within 2 hours.  You have a fever.  You keep throwing up (vomiting).  Your pain is felt only in portions of the abdomen, such as the right side or the left lower portion of the abdomen.  You pass bloody or black tarry stools. MAKE SURE YOU:  Understand these instructions.   Will watch your condition.    Will get help right away if you are not doing well or get worse.  Document Released: 10/15/2004 Document Revised: 10/26/2012 Document Reviewed: 09/14/2012 [facility] Patient Information 2014 [place].

## 2013-04-30 NOTE — ED Provider Notes (Signed)
Patient seen/examined in the Emergency Department in conjunction with Midlevel Provider Rona Ravens Patient reports abd pain Exam : awake/alert, no distress, mild tenderness to LUQ.  Otherwise abd exam unremarkable Plan: labs pending then d/c home  BP 139/81  Pulse 73  Temp(Src) 98.9 F (37.2 C) (Oral)  Resp 18  Ht 5\' 8"  (1.727 m)  Wt 163 lb (73.936 kg)  BMI 24.79 kg/m2  SpO2 100%   Sharyon Cable, MD 04/30/13 1003

## 2013-04-30 NOTE — ED Notes (Signed)
Pt presents from home with c/o of his pancreatitis flaring up that has been ongoing for a couple days.  Pt is also concerned with bilateral lower extremity and left arm numbness and tingling that has been ongoing x 1 week.  Pt states he is nauseated with emesis x 1 in past 24 hours.

## 2013-04-30 NOTE — ED Provider Notes (Signed)
Medical screening examination/treatment/procedure(s) were conducted as a shared visit with non-physician practitioner(s) and myself.  I personally evaluated the patient during the encounter.   EKG Interpretation None        Date: 04/30/2013  Rate: 82  Rhythm: normal sinus rhythm  QRS Axis: normal  Intervals: normal  ST/T Wave abnormalities: nonspecific ST changes  Conduction Disutrbances:none  Narrative Interpretation:   Old EKG Reviewed: unchanged    Sharyon Cable, MD 04/30/13 1213

## 2013-05-05 ENCOUNTER — Encounter (HOSPITAL_COMMUNITY): Payer: Self-pay | Admitting: Emergency Medicine

## 2013-05-05 ENCOUNTER — Other Ambulatory Visit: Payer: Self-pay

## 2013-05-05 ENCOUNTER — Emergency Department (HOSPITAL_COMMUNITY): Payer: Medicaid Other

## 2013-05-05 ENCOUNTER — Emergency Department (HOSPITAL_COMMUNITY)
Admission: EM | Admit: 2013-05-05 | Discharge: 2013-05-05 | Disposition: A | Discharge status: Home / Self Care | Payer: Medicaid Other | Attending: Emergency Medicine | Admitting: Emergency Medicine

## 2013-05-05 DIAGNOSIS — F17200 Nicotine dependence, unspecified, uncomplicated: Secondary | ICD-10-CM | POA: Insufficient documentation

## 2013-05-05 DIAGNOSIS — N189 Chronic kidney disease, unspecified: Secondary | ICD-10-CM | POA: Insufficient documentation

## 2013-05-05 DIAGNOSIS — Z79899 Other long term (current) drug therapy: Secondary | ICD-10-CM | POA: Insufficient documentation

## 2013-05-05 DIAGNOSIS — R109 Unspecified abdominal pain: Secondary | ICD-10-CM

## 2013-05-05 DIAGNOSIS — I129 Hypertensive chronic kidney disease with stage 1 through stage 4 chronic kidney disease, or unspecified chronic kidney disease: Secondary | ICD-10-CM | POA: Insufficient documentation

## 2013-05-05 DIAGNOSIS — I209 Angina pectoris, unspecified: Secondary | ICD-10-CM | POA: Insufficient documentation

## 2013-05-05 DIAGNOSIS — Z862 Personal history of diseases of the blood and blood-forming organs and certain disorders involving the immune mechanism: Secondary | ICD-10-CM | POA: Insufficient documentation

## 2013-05-05 DIAGNOSIS — G8929 Other chronic pain: Secondary | ICD-10-CM | POA: Insufficient documentation

## 2013-05-05 DIAGNOSIS — R112 Nausea with vomiting, unspecified: Secondary | ICD-10-CM | POA: Insufficient documentation

## 2013-05-05 DIAGNOSIS — Z9889 Other specified postprocedural states: Secondary | ICD-10-CM | POA: Insufficient documentation

## 2013-05-05 DIAGNOSIS — Z8639 Personal history of other endocrine, nutritional and metabolic disease: Secondary | ICD-10-CM | POA: Insufficient documentation

## 2013-05-05 DIAGNOSIS — Z8739 Personal history of other diseases of the musculoskeletal system and connective tissue: Secondary | ICD-10-CM | POA: Insufficient documentation

## 2013-05-05 DIAGNOSIS — Z8719 Personal history of other diseases of the digestive system: Secondary | ICD-10-CM | POA: Insufficient documentation

## 2013-05-05 DIAGNOSIS — R1013 Epigastric pain: Secondary | ICD-10-CM | POA: Insufficient documentation

## 2013-05-05 DIAGNOSIS — J45909 Unspecified asthma, uncomplicated: Secondary | ICD-10-CM | POA: Insufficient documentation

## 2013-05-05 DIAGNOSIS — Z87448 Personal history of other diseases of urinary system: Secondary | ICD-10-CM | POA: Insufficient documentation

## 2013-05-05 DIAGNOSIS — Z8619 Personal history of other infectious and parasitic diseases: Secondary | ICD-10-CM | POA: Insufficient documentation

## 2013-05-05 LAB — COMPREHENSIVE METABOLIC PANEL
ALK PHOS: 113 U/L (ref 39–117)
ALT: 48 U/L (ref 0–53)
AST: 27 U/L (ref 0–37)
Albumin: 3.7 g/dL (ref 3.5–5.2)
BUN: 11 mg/dL (ref 6–23)
CALCIUM: 9.5 mg/dL (ref 8.4–10.5)
CO2: 25 meq/L (ref 19–32)
Chloride: 99 mEq/L (ref 96–112)
Creatinine, Ser: 1.04 mg/dL (ref 0.50–1.35)
GFR, EST NON AFRICAN AMERICAN: 79 mL/min — AB (ref >90)
Glucose, Bld: 169 mg/dL — ABNORMAL HIGH (ref 70–99)
POTASSIUM: 3.6 meq/L — AB (ref 3.7–5.3)
SODIUM: 138 meq/L (ref 137–147)
TOTAL PROTEIN: 7.1 g/dL (ref 6.0–8.3)
Total Bilirubin: 0.6 mg/dL (ref 0.3–1.2)

## 2013-05-05 LAB — CBC WITH DIFFERENTIAL/PLATELET
Basophils Absolute: 0 10*3/uL (ref 0.0–0.1)
Basophils Relative: 1 % (ref 0–1)
EOS PCT: 2 % (ref 0–5)
Eosinophils Absolute: 0.2 10*3/uL (ref 0.0–0.7)
HCT: 46 % (ref 39.0–52.0)
Hemoglobin: 16.3 g/dL (ref 13.0–17.0)
LYMPHS ABS: 4.6 10*3/uL — AB (ref 0.7–4.0)
LYMPHS PCT: 52 % — AB (ref 12–46)
MCH: 32.2 pg (ref 26.0–34.0)
MCHC: 35.4 g/dL (ref 30.0–36.0)
MCV: 90.9 fL (ref 78.0–100.0)
MONOS PCT: 7 % (ref 3–12)
Monocytes Absolute: 0.6 10*3/uL (ref 0.1–1.0)
Neutro Abs: 3.3 10*3/uL (ref 1.7–7.7)
Neutrophils Relative %: 38 % — ABNORMAL LOW (ref 43–77)
PLATELETS: 293 10*3/uL (ref 150–400)
RBC: 5.06 MIL/uL (ref 4.22–5.81)
RDW: 13.4 % (ref 11.5–15.5)
WBC: 8.8 10*3/uL (ref 4.0–10.5)

## 2013-05-05 LAB — I-STAT TROPONIN, ED: Troponin i, poc: 0 ng/mL (ref 0.00–0.08)

## 2013-05-05 LAB — LIPASE, BLOOD: LIPASE: 60 U/L — AB (ref 11–59)

## 2013-05-05 MED ORDER — SODIUM CHLORIDE 0.9 % IV BOLUS (SEPSIS)
1000.0000 mL | Freq: Once | INTRAVENOUS | Status: AC
  Administered 2013-05-05: 1000 mL via INTRAVENOUS

## 2013-05-05 MED ORDER — MORPHINE SULFATE 4 MG/ML IJ SOLN
4.0000 mg | Freq: Once | INTRAMUSCULAR | Status: AC
  Administered 2013-05-05: 4 mg via INTRAVENOUS
  Filled 2013-05-05: qty 1

## 2013-05-05 MED ORDER — ONDANSETRON HCL 4 MG/2ML IJ SOLN
4.0000 mg | Freq: Once | INTRAMUSCULAR | Status: AC
  Administered 2013-05-05: 4 mg via INTRAVENOUS
  Filled 2013-05-05: qty 2

## 2013-05-05 NOTE — ED Notes (Signed)
Pt c/o left abd pain onset approx 1 hr ago.  ST's has vomited x's 2.  Hx of pancreatitis.

## 2013-05-05 NOTE — ED Notes (Signed)
Abd. Pain, n/v. Hx. Of pancreatitis. Pain radiates from "side to side."

## 2013-05-05 NOTE — Discharge Instructions (Signed)
Abdominal Pain, Adult °Many things can cause abdominal pain. Usually, abdominal pain is not caused by a disease and will improve without treatment. It can often be observed and treated at home. Your health care provider will do a physical exam and possibly order blood tests and X-rays to help determine the seriousness of your pain. However, in many cases, more time must pass before a clear cause of the pain can be found. Before that point, your health care provider may not know if you need more testing or further treatment. °HOME CARE INSTRUCTIONS  °Monitor your abdominal pain for any changes. The following actions may help to alleviate any discomfort you are experiencing: °· Only take over-the-counter or prescription medicines as directed by your health care provider. °· Do not take laxatives unless directed to do so by your health care provider. °· Try a clear liquid diet (broth, tea, or water) as directed by your health care provider. Slowly move to a bland diet as tolerated. °SEEK MEDICAL CARE IF: °· You have unexplained abdominal pain. °· You have abdominal pain associated with nausea or diarrhea. °· You have pain when you urinate or have a bowel movement. °· You experience abdominal pain that wakes you in the night. °· You have abdominal pain that is worsened or improved by eating food. °· You have abdominal pain that is worsened with eating fatty foods. °SEEK IMMEDIATE MEDICAL CARE IF:  °· Your pain does not go away within 2 hours. °· You have a fever. °· You keep throwing up (vomiting). °· Your pain is felt only in portions of the abdomen, such as the right side or the left lower portion of the abdomen. °· You pass bloody or black tarry stools. °MAKE SURE YOU: °· Understand these instructions.   °· Will watch your condition.   °· Will get help right away if you are not doing well or get worse.   °Document Released: 10/15/2004 Document Revised: 10/26/2012 Document Reviewed: 09/14/2012 °[employer]® Patient  Information ©2014 [employer]. ° °
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
## 2013-05-07 ENCOUNTER — Emergency Department (HOSPITAL_COMMUNITY)
Admission: EM | Admit: 2013-05-07 | Discharge: 2013-05-07 | Disposition: A | Discharge status: Home / Self Care | Payer: Medicaid Other | Attending: Emergency Medicine | Admitting: Emergency Medicine

## 2013-05-07 ENCOUNTER — Encounter (HOSPITAL_COMMUNITY): Payer: Self-pay | Admitting: Emergency Medicine

## 2013-05-07 ENCOUNTER — Emergency Department (HOSPITAL_COMMUNITY): Payer: Medicaid Other

## 2013-05-07 ENCOUNTER — Other Ambulatory Visit: Payer: Self-pay

## 2013-05-07 DIAGNOSIS — R1012 Left upper quadrant pain: Secondary | ICD-10-CM | POA: Insufficient documentation

## 2013-05-07 DIAGNOSIS — Z8719 Personal history of other diseases of the digestive system: Secondary | ICD-10-CM | POA: Insufficient documentation

## 2013-05-07 DIAGNOSIS — R112 Nausea with vomiting, unspecified: Secondary | ICD-10-CM | POA: Insufficient documentation

## 2013-05-07 DIAGNOSIS — R002 Palpitations: Secondary | ICD-10-CM | POA: Insufficient documentation

## 2013-05-07 DIAGNOSIS — G8929 Other chronic pain: Secondary | ICD-10-CM | POA: Insufficient documentation

## 2013-05-07 DIAGNOSIS — Z8619 Personal history of other infectious and parasitic diseases: Secondary | ICD-10-CM | POA: Insufficient documentation

## 2013-05-07 DIAGNOSIS — Z9889 Other specified postprocedural states: Secondary | ICD-10-CM | POA: Insufficient documentation

## 2013-05-07 DIAGNOSIS — R1013 Epigastric pain: Secondary | ICD-10-CM | POA: Insufficient documentation

## 2013-05-07 DIAGNOSIS — N189 Chronic kidney disease, unspecified: Secondary | ICD-10-CM | POA: Insufficient documentation

## 2013-05-07 DIAGNOSIS — I10 Essential (primary) hypertension: Secondary | ICD-10-CM | POA: Insufficient documentation

## 2013-05-07 DIAGNOSIS — Z87448 Personal history of other diseases of urinary system: Secondary | ICD-10-CM | POA: Insufficient documentation

## 2013-05-07 DIAGNOSIS — R109 Unspecified abdominal pain: Secondary | ICD-10-CM

## 2013-05-07 DIAGNOSIS — J45909 Unspecified asthma, uncomplicated: Secondary | ICD-10-CM | POA: Insufficient documentation

## 2013-05-07 DIAGNOSIS — F17200 Nicotine dependence, unspecified, uncomplicated: Secondary | ICD-10-CM | POA: Insufficient documentation

## 2013-05-07 DIAGNOSIS — R079 Chest pain, unspecified: Secondary | ICD-10-CM | POA: Insufficient documentation

## 2013-05-07 DIAGNOSIS — Z79899 Other long term (current) drug therapy: Secondary | ICD-10-CM | POA: Insufficient documentation

## 2013-05-07 DIAGNOSIS — R1033 Periumbilical pain: Secondary | ICD-10-CM | POA: Insufficient documentation

## 2013-05-07 DIAGNOSIS — R209 Unspecified disturbances of skin sensation: Secondary | ICD-10-CM | POA: Insufficient documentation

## 2013-05-07 LAB — CBC WITH DIFFERENTIAL/PLATELET
BASOS ABS: 0 10*3/uL (ref 0.0–0.1)
Basophils Relative: 0 % (ref 0–1)
EOS PCT: 2 % (ref 0–5)
Eosinophils Absolute: 0.1 10*3/uL (ref 0.0–0.7)
HCT: 49 % (ref 39.0–52.0)
Hemoglobin: 17.7 g/dL — ABNORMAL HIGH (ref 13.0–17.0)
Lymphocytes Relative: 45 % (ref 12–46)
Lymphs Abs: 3.1 10*3/uL (ref 0.7–4.0)
MCH: 32.5 pg (ref 26.0–34.0)
MCHC: 36.1 g/dL — ABNORMAL HIGH (ref 30.0–36.0)
MCV: 89.9 fL (ref 78.0–100.0)
Monocytes Absolute: 0.5 10*3/uL (ref 0.1–1.0)
Monocytes Relative: 7 % (ref 3–12)
Neutro Abs: 3.1 10*3/uL (ref 1.7–7.7)
Neutrophils Relative %: 46 % (ref 43–77)
PLATELETS: 295 10*3/uL (ref 150–400)
RBC: 5.45 MIL/uL (ref 4.22–5.81)
RDW: 13.2 % (ref 11.5–15.5)
WBC: 6.9 10*3/uL (ref 4.0–10.5)

## 2013-05-07 LAB — COMPREHENSIVE METABOLIC PANEL
ALBUMIN: 3.9 g/dL (ref 3.5–5.2)
ALT: 55 U/L — ABNORMAL HIGH (ref 0–53)
AST: 30 U/L (ref 0–37)
Alkaline Phosphatase: 114 U/L (ref 39–117)
BUN: 15 mg/dL (ref 6–23)
CALCIUM: 10 mg/dL (ref 8.4–10.5)
CO2: 23 mEq/L (ref 19–32)
Chloride: 99 mEq/L (ref 96–112)
Creatinine, Ser: 1.07 mg/dL (ref 0.50–1.35)
GFR calc Af Amer: 88 mL/min — ABNORMAL LOW (ref >90)
GFR, EST NON AFRICAN AMERICAN: 76 mL/min — AB (ref >90)
Glucose, Bld: 147 mg/dL — ABNORMAL HIGH (ref 70–99)
Potassium: 3.7 mEq/L (ref 3.7–5.3)
SODIUM: 139 meq/L (ref 137–147)
TOTAL PROTEIN: 7.9 g/dL (ref 6.0–8.3)
Total Bilirubin: 0.8 mg/dL (ref 0.3–1.2)

## 2013-05-07 LAB — URINALYSIS, ROUTINE W REFLEX MICROSCOPIC
GLUCOSE, UA: NEGATIVE mg/dL
Ketones, ur: NEGATIVE mg/dL
Nitrite: NEGATIVE
Protein, ur: 100 mg/dL — AB
SPECIFIC GRAVITY, URINE: 1.025 (ref 1.005–1.030)
Urobilinogen, UA: 1 mg/dL (ref 0.0–1.0)
pH: 5.5 (ref 5.0–8.0)

## 2013-05-07 LAB — I-STAT TROPONIN, ED: TROPONIN I, POC: 0 ng/mL (ref 0.00–0.08)

## 2013-05-07 LAB — URINE MICROSCOPIC-ADD ON

## 2013-05-07 LAB — LIPASE, BLOOD: Lipase: 51 U/L (ref 11–59)

## 2013-05-07 MED ORDER — OXYCODONE HCL 5 MG PO TABS
5.0000 mg | ORAL_TABLET | Freq: Once | ORAL | Status: AC
  Administered 2013-05-07: 5 mg via ORAL
  Filled 2013-05-07: qty 1

## 2013-05-07 MED ORDER — SODIUM CHLORIDE 0.9 % IV BOLUS (SEPSIS)
1000.0000 mL | Freq: Once | INTRAVENOUS | Status: AC
  Administered 2013-05-07: 1000 mL via INTRAVENOUS

## 2013-05-07 MED ORDER — ONDANSETRON HCL 4 MG/2ML IJ SOLN
4.0000 mg | Freq: Once | INTRAMUSCULAR | Status: AC
  Administered 2013-05-07: 4 mg via INTRAVENOUS
  Filled 2013-05-07: qty 2

## 2013-05-07 MED ORDER — OXYCODONE HCL 5 MG PO TABS: 5.0000 mg | ORAL_TABLET | ORAL | Status: DC | PRN

## 2013-05-07 MED ORDER — MORPHINE SULFATE 4 MG/ML IJ SOLN
4.0000 mg | Freq: Once | INTRAMUSCULAR | Status: AC
  Administered 2013-05-07: 4 mg via INTRAVENOUS
  Filled 2013-05-07: qty 1

## 2013-05-07 NOTE — ED Notes (Signed)
Pt given ginger ale to sip on.

## 2013-05-07 NOTE — ED Notes (Signed)
Patient transported to X-ray 

## 2013-05-07 NOTE — ED Provider Notes (Signed)
Medical screening examination/treatment/procedure(s) were performed by non-physician practitioner and as supervising physician I was immediately available for consultation/collaboration.   EKG Interpretation None       Jerry Terrell. Alvino Chapel, MD 05/07/13 2043

## 2013-05-07 NOTE — Discharge Instructions (Signed)
Please read and follow all provided instructions.  Your diagnoses today include:  1. Abdominal pain     Tests performed today include:  Blood counts and electrolytes  Blood tests to check liver and kidney function  Blood tests to check pancreas function  Urine test to look for infection  Chest x-ray - no blockages or signs of air leak  Vital signs. See below for your results today.   Medications prescribed:   Oxycodone - narcotic pain medication  DO NOT drive or perform any activities that require you to be awake and alert because this medicine can make you drowsy.   Take any prescribed medications only as directed.  Home care instructions:   Follow any educational materials contained in this packet.  Follow-up instructions: Please follow-up with your primary care provider in the next 3 days for further evaluation of your symptoms. If you do not have a primary care doctor -- see below for referral information.   Return instructions:  SEEK IMMEDIATE MEDICAL ATTENTION IF:  The pain does not go away or becomes severe   A temperature above 101F develops   Repeated vomiting occurs (multiple episodes)   The pain becomes localized to portions of the abdomen. The right side could possibly be appendicitis. In an adult, the left lower portion of the abdomen could be colitis or diverticulitis.   Blood is being passed in stools or vomit (bright red or black tarry stools)   You develop chest pain, difficulty breathing, dizziness or fainting, or become confused, poorly responsive, or inconsolable (young children)  If you have any other emergent concerns regarding your health  Additional Information: Abdominal (belly) pain can be caused by many things. Your caregiver performed an examination and possibly ordered blood/urine tests and imaging (CT scan, x-rays, ultrasound). Many cases can be observed and treated at home after initial evaluation in the emergency department. Even  though you are being discharged home, abdominal pain can be unpredictable. Therefore, you need a repeated exam if your pain does not resolve, returns, or worsens. Most patients with abdominal pain don't have to be admitted to the hospital or have surgery, but serious problems like appendicitis and gallbladder attacks can start out as nonspecific pain. Many abdominal conditions cannot be diagnosed in one visit, so follow-up evaluations are very important.  Your vital signs today were: BP 120/85   Pulse 64   Temp(Src) 98.1 F (36.7 C) (Oral)   Resp 17   SpO2 98% If your blood pressure (bp) was elevated above 135/85 this visit, please have this repeated by your doctor within one month. --------------

## 2013-05-07 NOTE — ED Notes (Signed)
Pt presents to department for evaluation of epigastric pain radiating to L side and around to back. Ongoing since this morning. States he has cyst on pancreas. Also states heart "fluttering" and soreness to both legs. 9/10 pain at the time. Pt is alert and oriented x4.
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
## 2013-05-11 ENCOUNTER — Encounter: Payer: Self-pay | Admitting: Internal Medicine

## 2013-05-17 ENCOUNTER — Telehealth: Payer: Self-pay | Admitting: *Deleted

## 2013-05-17 NOTE — Telephone Encounter (Signed)
I called Jerry Terrell to make sure he was clear on Dr. Claris Gladden plan moving forward, that Dr. Aundra Dubin will not be writing narcotic pain control prescriptions but will treat any other ailments, so that there were no confused expectations at his next visit.

## 2013-05-29 NOTE — Addendum Note (Signed)
Addended by: Hulan Fray on: 05/29/2013 01:43 PM   Modules accepted: Orders

## 2013-05-31 ENCOUNTER — Emergency Department (HOSPITAL_COMMUNITY)
Admission: EM | Admit: 2013-05-31 | Discharge: 2013-05-31 | Disposition: A | Discharge status: Home / Self Care | Payer: Medicaid Other | Attending: Emergency Medicine | Admitting: Emergency Medicine

## 2013-05-31 ENCOUNTER — Encounter (HOSPITAL_COMMUNITY): Payer: Self-pay | Admitting: Emergency Medicine

## 2013-05-31 DIAGNOSIS — I129 Hypertensive chronic kidney disease with stage 1 through stage 4 chronic kidney disease, or unspecified chronic kidney disease: Secondary | ICD-10-CM | POA: Insufficient documentation

## 2013-05-31 DIAGNOSIS — R112 Nausea with vomiting, unspecified: Secondary | ICD-10-CM | POA: Insufficient documentation

## 2013-05-31 DIAGNOSIS — G8929 Other chronic pain: Secondary | ICD-10-CM | POA: Insufficient documentation

## 2013-05-31 DIAGNOSIS — F17200 Nicotine dependence, unspecified, uncomplicated: Secondary | ICD-10-CM | POA: Insufficient documentation

## 2013-05-31 DIAGNOSIS — I209 Angina pectoris, unspecified: Secondary | ICD-10-CM | POA: Insufficient documentation

## 2013-05-31 DIAGNOSIS — G43909 Migraine, unspecified, not intractable, without status migrainosus: Secondary | ICD-10-CM | POA: Insufficient documentation

## 2013-05-31 DIAGNOSIS — Z8619 Personal history of other infectious and parasitic diseases: Secondary | ICD-10-CM | POA: Insufficient documentation

## 2013-05-31 DIAGNOSIS — N189 Chronic kidney disease, unspecified: Secondary | ICD-10-CM | POA: Insufficient documentation

## 2013-05-31 DIAGNOSIS — R509 Fever, unspecified: Secondary | ICD-10-CM | POA: Insufficient documentation

## 2013-05-31 DIAGNOSIS — Z9889 Other specified postprocedural states: Secondary | ICD-10-CM | POA: Insufficient documentation

## 2013-05-31 DIAGNOSIS — N400 Enlarged prostate without lower urinary tract symptoms: Secondary | ICD-10-CM | POA: Insufficient documentation

## 2013-05-31 DIAGNOSIS — K219 Gastro-esophageal reflux disease without esophagitis: Secondary | ICD-10-CM | POA: Insufficient documentation

## 2013-05-31 DIAGNOSIS — I10 Essential (primary) hypertension: Secondary | ICD-10-CM | POA: Insufficient documentation

## 2013-05-31 DIAGNOSIS — J45909 Unspecified asthma, uncomplicated: Secondary | ICD-10-CM | POA: Insufficient documentation

## 2013-05-31 DIAGNOSIS — Z79899 Other long term (current) drug therapy: Secondary | ICD-10-CM | POA: Insufficient documentation

## 2013-05-31 DIAGNOSIS — R109 Unspecified abdominal pain: Secondary | ICD-10-CM | POA: Insufficient documentation

## 2013-05-31 LAB — CBC WITH DIFFERENTIAL/PLATELET
BASOS PCT: 1 % (ref 0–1)
Basophils Absolute: 0 10*3/uL (ref 0.0–0.1)
EOS ABS: 0.1 10*3/uL (ref 0.0–0.7)
Eosinophils Relative: 2 % (ref 0–5)
HEMATOCRIT: 45.4 % (ref 39.0–52.0)
Hemoglobin: 15.8 g/dL (ref 13.0–17.0)
LYMPHS ABS: 2.2 10*3/uL (ref 0.7–4.0)
Lymphocytes Relative: 41 % (ref 12–46)
MCH: 31.9 pg (ref 26.0–34.0)
MCHC: 34.8 g/dL (ref 30.0–36.0)
MCV: 91.7 fL (ref 78.0–100.0)
MONOS PCT: 10 % (ref 3–12)
Monocytes Absolute: 0.5 10*3/uL (ref 0.1–1.0)
NEUTROS ABS: 2.5 10*3/uL (ref 1.7–7.7)
NEUTROS PCT: 46 % (ref 43–77)
Platelets: 261 10*3/uL (ref 150–400)
RBC: 4.95 MIL/uL (ref 4.22–5.81)
RDW: 13.5 % (ref 11.5–15.5)
WBC: 5.3 10*3/uL (ref 4.0–10.5)

## 2013-05-31 LAB — COMPREHENSIVE METABOLIC PANEL
ALBUMIN: 3.8 g/dL (ref 3.5–5.2)
ALT: 38 U/L (ref 0–53)
AST: 24 U/L (ref 0–37)
Alkaline Phosphatase: 104 U/L (ref 39–117)
BUN: 8 mg/dL (ref 6–23)
CALCIUM: 9.9 mg/dL (ref 8.4–10.5)
CO2: 28 mEq/L (ref 19–32)
CREATININE: 1.05 mg/dL (ref 0.50–1.35)
Chloride: 100 mEq/L (ref 96–112)
GFR calc Af Amer: >90 mL/min (ref >90)
GFR calc non Af Amer: 78 mL/min — ABNORMAL LOW (ref >90)
Glucose, Bld: 125 mg/dL — ABNORMAL HIGH (ref 70–99)
Potassium: 4.3 mEq/L (ref 3.7–5.3)
SODIUM: 140 meq/L (ref 137–147)
TOTAL PROTEIN: 7.3 g/dL (ref 6.0–8.3)
Total Bilirubin: 1.1 mg/dL (ref 0.3–1.2)

## 2013-05-31 LAB — LIPASE, BLOOD: Lipase: 30 U/L (ref 11–59)

## 2013-05-31 MED ORDER — ONDANSETRON HCL 4 MG/2ML IJ SOLN
4.0000 mg | Freq: Once | INTRAMUSCULAR | Status: AC
  Administered 2013-05-31: 4 mg via INTRAVENOUS
  Filled 2013-05-31: qty 2

## 2013-05-31 MED ORDER — HYDROMORPHONE HCL PF 1 MG/ML IJ SOLN
1.0000 mg | Freq: Once | INTRAMUSCULAR | Status: AC
  Administered 2013-05-31: 1 mg via INTRAVENOUS
  Filled 2013-05-31: qty 1

## 2013-05-31 MED ORDER — HYDROMORPHONE HCL PF 1 MG/ML IJ SOLN
1.0000 mg | Freq: Once | INTRAMUSCULAR | Status: AC
Start: 2013-05-31 — End: 2013-05-31
  Administered 2013-05-31: 1 mg via INTRAVENOUS
  Filled 2013-05-31: qty 1

## 2013-05-31 NOTE — ED Notes (Addendum)
Pt reports pancreas "acting up again" vomiting. Recently seen at [facility] and was told to come to ER if he has any problems with pancreas. Hx hep C. And kidney issues.

## 2013-05-31 NOTE — Discharge Planning (Signed)
[facility] Liaison  Patient is a current orange Conservation officer, historic buildings and established patient at [facility]. Patient has been seen several times in the ED for similar complaints. In previous ED visits pt was linked with a [facility] Case manager.  The case manager has made several attempts to call the patient on all provided numbers but attempts unsuccessful.  Pt has been educated today and in previous visits of ED utilization, proper uses of his orange card, and the importance of using his PCP for non- urgent matters. Pt expressed concerns about pain management and pain medications pertaining with his PCP. After speaking with Dr. Mingo Amber the pts attending, I attempted to make an appointment with the patients PCP. Pt appointment contingent upon a care plan being in place. I spoke with Tawni Levy the Director at the [facility] about pts frequent ED utilization and a solution to the needs expressed by the pt. Pt was made aware of a meeting that will occur with his PCP and other medical staff. My contact information was provided for any future questions or concerns.
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
## 2013-05-31 NOTE — Discharge Instructions (Signed)

## 2013-06-03 LAB — LIPASE, URINE

## 2013-06-05 ENCOUNTER — Encounter: Payer: Self-pay | Admitting: Licensed Clinical Social Worker

## 2013-06-05 NOTE — Progress Notes (Signed)
Patient ID: Jerry Terrell, male   DOB: January 12, 1961, 53 y.o.   MRN: [id number] CSW received call from [place], L. Broadnax to discuss plan of care meeting.  CSW will send request to pt's PCP as to best time to have meeting.  [facility] has been unable to reach [name] via telephone and states will try to make contact again today.  CSW informed Mr. Almond, chart notation and FYI regarding [place] and narcotics.  Pt has had 19 ED visit in the last 6 months.  EMR does not reflect any calls into [facility] for potential appointment.  Care Conference will be held at PCP availability.

## 2013-06-25 ENCOUNTER — Encounter (HOSPITAL_COMMUNITY): Payer: Self-pay | Admitting: Emergency Medicine

## 2013-06-25 ENCOUNTER — Emergency Department (HOSPITAL_COMMUNITY)
Admission: EM | Admit: 2013-06-25 | Discharge: 2013-06-26 | Disposition: A | Discharge status: Home / Self Care | Payer: Medicaid Other | Attending: Emergency Medicine | Admitting: Emergency Medicine

## 2013-06-25 DIAGNOSIS — R112 Nausea with vomiting, unspecified: Secondary | ICD-10-CM | POA: Insufficient documentation

## 2013-06-25 DIAGNOSIS — J45909 Unspecified asthma, uncomplicated: Secondary | ICD-10-CM | POA: Insufficient documentation

## 2013-06-25 DIAGNOSIS — Z9889 Other specified postprocedural states: Secondary | ICD-10-CM | POA: Insufficient documentation

## 2013-06-25 DIAGNOSIS — Z79899 Other long term (current) drug therapy: Secondary | ICD-10-CM | POA: Insufficient documentation

## 2013-06-25 DIAGNOSIS — Z8719 Personal history of other diseases of the digestive system: Secondary | ICD-10-CM | POA: Insufficient documentation

## 2013-06-25 DIAGNOSIS — Z8619 Personal history of other infectious and parasitic diseases: Secondary | ICD-10-CM | POA: Insufficient documentation

## 2013-06-25 DIAGNOSIS — I129 Hypertensive chronic kidney disease with stage 1 through stage 4 chronic kidney disease, or unspecified chronic kidney disease: Secondary | ICD-10-CM | POA: Insufficient documentation

## 2013-06-25 DIAGNOSIS — G8929 Other chronic pain: Secondary | ICD-10-CM | POA: Insufficient documentation

## 2013-06-25 DIAGNOSIS — N189 Chronic kidney disease, unspecified: Secondary | ICD-10-CM | POA: Insufficient documentation

## 2013-06-25 DIAGNOSIS — Z8669 Personal history of other diseases of the nervous system and sense organs: Secondary | ICD-10-CM | POA: Insufficient documentation

## 2013-06-25 DIAGNOSIS — F17200 Nicotine dependence, unspecified, uncomplicated: Secondary | ICD-10-CM | POA: Insufficient documentation

## 2013-06-25 DIAGNOSIS — R1013 Epigastric pain: Secondary | ICD-10-CM | POA: Insufficient documentation

## 2013-06-25 HISTORY — DX: Acute pancreatitis without necrosis or infection, unspecified: K85.90

## 2013-06-25 LAB — URINALYSIS, ROUTINE W REFLEX MICROSCOPIC
Bilirubin Urine: NEGATIVE
Glucose, UA: NEGATIVE mg/dL
Hgb urine dipstick: NEGATIVE
Ketones, ur: NEGATIVE mg/dL
Nitrite: NEGATIVE
PROTEIN: NEGATIVE mg/dL
Specific Gravity, Urine: 1.007 (ref 1.005–1.030)
UROBILINOGEN UA: 0.2 mg/dL (ref 0.0–1.0)
pH: 6.5 (ref 5.0–8.0)

## 2013-06-25 LAB — CBC WITH DIFFERENTIAL/PLATELET
Basophils Absolute: 0 10*3/uL (ref 0.0–0.1)
Basophils Relative: 0 % (ref 0–1)
EOS ABS: 0.2 10*3/uL (ref 0.0–0.7)
Eosinophils Relative: 2 % (ref 0–5)
HEMATOCRIT: 46 % (ref 39.0–52.0)
Hemoglobin: 16 g/dL (ref 13.0–17.0)
LYMPHS ABS: 5.1 10*3/uL — AB (ref 0.7–4.0)
LYMPHS PCT: 60 % — AB (ref 12–46)
MCH: 32 pg (ref 26.0–34.0)
MCHC: 34.8 g/dL (ref 30.0–36.0)
MCV: 92 fL (ref 78.0–100.0)
MONO ABS: 0.6 10*3/uL (ref 0.1–1.0)
Monocytes Relative: 7 % (ref 3–12)
Neutro Abs: 2.6 10*3/uL (ref 1.7–7.7)
Neutrophils Relative %: 31 % — ABNORMAL LOW (ref 43–77)
Platelets: 239 10*3/uL (ref 150–400)
RBC: 5 MIL/uL (ref 4.22–5.81)
RDW: 13.5 % (ref 11.5–15.5)
WBC: 8.6 10*3/uL (ref 4.0–10.5)

## 2013-06-25 LAB — COMPREHENSIVE METABOLIC PANEL
ALT: 42 U/L (ref 0–53)
AST: 30 U/L (ref 0–37)
Albumin: 3.8 g/dL (ref 3.5–5.2)
Alkaline Phosphatase: 117 U/L (ref 39–117)
BUN: 10 mg/dL (ref 6–23)
CALCIUM: 9.7 mg/dL (ref 8.4–10.5)
CO2: 24 meq/L (ref 19–32)
CREATININE: 1.06 mg/dL (ref 0.50–1.35)
Chloride: 100 mEq/L (ref 96–112)
GFR, EST AFRICAN AMERICAN: 89 mL/min — AB (ref >90)
GFR, EST NON AFRICAN AMERICAN: 77 mL/min — AB (ref >90)
Glucose, Bld: 86 mg/dL (ref 70–99)
Potassium: 3.9 mEq/L (ref 3.7–5.3)
Sodium: 138 mEq/L (ref 137–147)
TOTAL PROTEIN: 7.5 g/dL (ref 6.0–8.3)
Total Bilirubin: 0.7 mg/dL (ref 0.3–1.2)

## 2013-06-25 LAB — LIPASE, BLOOD: LIPASE: 45 U/L (ref 11–59)

## 2013-06-25 LAB — URINE MICROSCOPIC-ADD ON

## 2013-06-25 LAB — TROPONIN I: Troponin I: <0.3 ng/mL (ref <0.30)

## 2013-06-25 MED ORDER — HYDROMORPHONE HCL PF 1 MG/ML IJ SOLN
1.0000 mg | Freq: Once | INTRAMUSCULAR | Status: AC
  Administered 2013-06-25: 1 mg via INTRAVENOUS
  Filled 2013-06-25: qty 1

## 2013-06-25 MED ORDER — ONDANSETRON HCL 4 MG/2ML IJ SOLN
4.0000 mg | Freq: Once | INTRAMUSCULAR | Status: AC
  Administered 2013-06-25: 4 mg via INTRAVENOUS
  Filled 2013-06-25: qty 2

## 2013-06-25 NOTE — ED Notes (Signed)
Pt. reports LUQ pain with nausea and vomitting onset this evening with bilateral leg numbness/tingling , ambulatory .
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
## 2013-06-25 NOTE — ED Notes (Signed)
Pt given gingerale for fluid challenge. 

## 2013-06-25 NOTE — ED Notes (Signed)
PA at the bedside.

## 2013-06-26 MED ORDER — OXYCODONE-ACETAMINOPHEN 5-325 MG PO TABS: 1.0000 | ORAL_TABLET | Freq: Four times a day (QID) | ORAL | Status: DC | PRN

## 2013-06-26 NOTE — Discharge Instructions (Signed)
Abdominal Pain, Adult °Many things can cause abdominal pain. Usually, abdominal pain is not caused by a disease and will improve without treatment. It can often be observed and treated at home. Your health care provider will do a physical exam and possibly order blood tests and X-rays to help determine the seriousness of your pain. However, in many cases, more time must pass before a clear cause of the pain can be found. Before that point, your health care provider may not know if you need more testing or further treatment. °HOME CARE INSTRUCTIONS  °Monitor your abdominal pain for any changes. The following actions may help to alleviate any discomfort you are experiencing: °· Only take over-the-counter or prescription medicines as directed by your health care provider. °· Do not take laxatives unless directed to do so by your health care provider. °· Try a clear liquid diet (broth, tea, or water) as directed by your health care provider. Slowly move to a bland diet as tolerated. °SEEK MEDICAL CARE IF: °· You have unexplained abdominal pain. °· You have abdominal pain associated with nausea or diarrhea. °· You have pain when you urinate or have a bowel movement. °· You experience abdominal pain that wakes you in the night. °· You have abdominal pain that is worsened or improved by eating food. °· You have abdominal pain that is worsened with eating fatty foods. °SEEK IMMEDIATE MEDICAL CARE IF:  °· Your pain does not go away within 2 hours. °· You have a fever. °· You keep throwing up (vomiting). °· Your pain is felt only in portions of the abdomen, such as the right side or the left lower portion of the abdomen. °· You pass bloody or black tarry stools. °MAKE SURE YOU: °· Understand these instructions.   °· Will watch your condition.   °· Will get help right away if you are not doing well or get worse.   °Document Released: 10/15/2004 Document Revised: 10/26/2012 Document Reviewed: 09/14/2012 °[employer]® Patient  Information ©2014 [employer]. ° °

## 2013-07-06 NOTE — ED Provider Notes (Signed)
Medical screening examination/treatment/procedure(s) were performed by non-physician practitioner and as supervising physician I was immediately available for consultation/collaboration.   EKG Interpretation   Date/Time:  Sunday June 25 2013 21:12:55 EDT Ventricular Rate:  83 PR Interval:  166 QRS Duration: 88 QT Interval:  356 QTC Calculation: 418 R Axis:   -19 Text Interpretation:  Normal sinus rhythm Possible Left atrial enlargement  Left ventricular hypertrophy Abnormal ECG ED PHYSICIAN INTERPRETATION  AVAILABLE IN [facility] Confirmed by TEST, Record ([id number]) on 06/27/2013  7:28:19 AM        Tanna Furry, MD 07/06/13 [phone]

## 2013-07-07 ENCOUNTER — Emergency Department (HOSPITAL_COMMUNITY): Payer: Medicaid Other

## 2013-07-07 ENCOUNTER — Encounter (HOSPITAL_COMMUNITY): Payer: Self-pay | Admitting: Emergency Medicine

## 2013-07-07 ENCOUNTER — Emergency Department (HOSPITAL_COMMUNITY)
Admission: EM | Admit: 2013-07-07 | Discharge: 2013-07-07 | Disposition: A | Discharge status: Home / Self Care | Payer: Medicaid Other | Attending: Emergency Medicine | Admitting: Emergency Medicine

## 2013-07-07 DIAGNOSIS — R141 Gas pain: Secondary | ICD-10-CM | POA: Insufficient documentation

## 2013-07-07 DIAGNOSIS — Z79899 Other long term (current) drug therapy: Secondary | ICD-10-CM | POA: Insufficient documentation

## 2013-07-07 DIAGNOSIS — R142 Eructation: Secondary | ICD-10-CM

## 2013-07-07 DIAGNOSIS — I209 Angina pectoris, unspecified: Secondary | ICD-10-CM | POA: Insufficient documentation

## 2013-07-07 DIAGNOSIS — I129 Hypertensive chronic kidney disease with stage 1 through stage 4 chronic kidney disease, or unspecified chronic kidney disease: Secondary | ICD-10-CM | POA: Insufficient documentation

## 2013-07-07 DIAGNOSIS — Z862 Personal history of diseases of the blood and blood-forming organs and certain disorders involving the immune mechanism: Secondary | ICD-10-CM | POA: Insufficient documentation

## 2013-07-07 DIAGNOSIS — R000 Tachycardia, unspecified: Secondary | ICD-10-CM | POA: Insufficient documentation

## 2013-07-07 DIAGNOSIS — R110 Nausea: Secondary | ICD-10-CM | POA: Insufficient documentation

## 2013-07-07 DIAGNOSIS — Z8639 Personal history of other endocrine, nutritional and metabolic disease: Secondary | ICD-10-CM | POA: Insufficient documentation

## 2013-07-07 DIAGNOSIS — R300 Dysuria: Secondary | ICD-10-CM | POA: Insufficient documentation

## 2013-07-07 DIAGNOSIS — R1084 Generalized abdominal pain: Secondary | ICD-10-CM | POA: Insufficient documentation

## 2013-07-07 DIAGNOSIS — F17200 Nicotine dependence, unspecified, uncomplicated: Secondary | ICD-10-CM | POA: Insufficient documentation

## 2013-07-07 DIAGNOSIS — R109 Unspecified abdominal pain: Secondary | ICD-10-CM

## 2013-07-07 DIAGNOSIS — R143 Flatulence: Secondary | ICD-10-CM

## 2013-07-07 DIAGNOSIS — G43909 Migraine, unspecified, not intractable, without status migrainosus: Secondary | ICD-10-CM | POA: Insufficient documentation

## 2013-07-07 DIAGNOSIS — G8929 Other chronic pain: Secondary | ICD-10-CM | POA: Insufficient documentation

## 2013-07-07 DIAGNOSIS — M549 Dorsalgia, unspecified: Secondary | ICD-10-CM | POA: Insufficient documentation

## 2013-07-07 DIAGNOSIS — Z8619 Personal history of other infectious and parasitic diseases: Secondary | ICD-10-CM | POA: Insufficient documentation

## 2013-07-07 DIAGNOSIS — J45909 Unspecified asthma, uncomplicated: Secondary | ICD-10-CM | POA: Insufficient documentation

## 2013-07-07 DIAGNOSIS — Z9889 Other specified postprocedural states: Secondary | ICD-10-CM | POA: Insufficient documentation

## 2013-07-07 DIAGNOSIS — Z8719 Personal history of other diseases of the digestive system: Secondary | ICD-10-CM | POA: Insufficient documentation

## 2013-07-07 DIAGNOSIS — N189 Chronic kidney disease, unspecified: Secondary | ICD-10-CM | POA: Insufficient documentation

## 2013-07-07 LAB — COMPREHENSIVE METABOLIC PANEL
ALK PHOS: 121 U/L — AB (ref 39–117)
ALT: 41 U/L (ref 0–53)
AST: 26 U/L (ref 0–37)
Albumin: 3.9 g/dL (ref 3.5–5.2)
BILIRUBIN TOTAL: 0.8 mg/dL (ref 0.3–1.2)
BUN: 9 mg/dL (ref 6–23)
CHLORIDE: 98 meq/L (ref 96–112)
CO2: 25 meq/L (ref 19–32)
Calcium: 10 mg/dL (ref 8.4–10.5)
Creatinine, Ser: 1.09 mg/dL (ref 0.50–1.35)
GFR calc non Af Amer: 75 mL/min — ABNORMAL LOW (ref >90)
GFR, EST AFRICAN AMERICAN: 87 mL/min — AB (ref >90)
GLUCOSE: 153 mg/dL — AB (ref 70–99)
POTASSIUM: 3.9 meq/L (ref 3.7–5.3)
Sodium: 139 mEq/L (ref 137–147)
Total Protein: 7.7 g/dL (ref 6.0–8.3)

## 2013-07-07 LAB — URINALYSIS, ROUTINE W REFLEX MICROSCOPIC
BILIRUBIN URINE: NEGATIVE
Glucose, UA: NEGATIVE mg/dL
KETONES UR: NEGATIVE mg/dL
Leukocytes, UA: NEGATIVE
NITRITE: NEGATIVE
PH: 5 (ref 5.0–8.0)
Protein, ur: NEGATIVE mg/dL
SPECIFIC GRAVITY, URINE: 1.014 (ref 1.005–1.030)
UROBILINOGEN UA: 1 mg/dL (ref 0.0–1.0)

## 2013-07-07 LAB — CBC WITH DIFFERENTIAL/PLATELET
BASOS ABS: 0 10*3/uL (ref 0.0–0.1)
BASOS PCT: 0 % (ref 0–1)
EOS PCT: 2 % (ref 0–5)
Eosinophils Absolute: 0.1 10*3/uL (ref 0.0–0.7)
HCT: 47.1 % (ref 39.0–52.0)
Hemoglobin: 16.7 g/dL (ref 13.0–17.0)
Lymphocytes Relative: 42 % (ref 12–46)
Lymphs Abs: 2.5 10*3/uL (ref 0.7–4.0)
MCH: 32.5 pg (ref 26.0–34.0)
MCHC: 35.5 g/dL (ref 30.0–36.0)
MCV: 91.6 fL (ref 78.0–100.0)
Monocytes Absolute: 0.4 10*3/uL (ref 0.1–1.0)
Monocytes Relative: 6 % (ref 3–12)
Neutro Abs: 3 10*3/uL (ref 1.7–7.7)
Neutrophils Relative %: 50 % (ref 43–77)
PLATELETS: 251 10*3/uL (ref 150–400)
RBC: 5.14 MIL/uL (ref 4.22–5.81)
RDW: 13.2 % (ref 11.5–15.5)
WBC: 6 10*3/uL (ref 4.0–10.5)

## 2013-07-07 LAB — URINE MICROSCOPIC-ADD ON

## 2013-07-07 LAB — LIPASE, BLOOD: Lipase: 35 U/L (ref 11–59)

## 2013-07-07 LAB — TROPONIN I: Troponin I: <0.3 ng/mL (ref <0.30)

## 2013-07-07 LAB — I-STAT CG4 LACTIC ACID, ED: LACTIC ACID, VENOUS: 1.02 mmol/L (ref 0.5–2.2)

## 2013-07-07 MED ORDER — ONDANSETRON HCL 4 MG/2ML IJ SOLN
4.0000 mg | Freq: Once | INTRAMUSCULAR | Status: AC
  Administered 2013-07-07: 4 mg via INTRAVENOUS
  Filled 2013-07-07: qty 2

## 2013-07-07 MED ORDER — OXYCODONE-ACETAMINOPHEN 5-325 MG PO TABS
1.0000 | ORAL_TABLET | Freq: Once | ORAL | Status: AC
  Administered 2013-07-07: 1 via ORAL
  Filled 2013-07-07: qty 1

## 2013-07-07 MED ORDER — MORPHINE SULFATE 2 MG/ML IJ SOLN: 2.0000 mg | Freq: Once | INTRAMUSCULAR | Status: DC

## 2013-07-07 MED ORDER — SODIUM CHLORIDE 0.9 % IV BOLUS (SEPSIS)
500.0000 mL | Freq: Once | INTRAVENOUS | Status: AC
  Administered 2013-07-07: 500 mL via INTRAVENOUS

## 2013-07-07 MED ORDER — ONDANSETRON HCL 4 MG PO TABS: 4.0000 mg | ORAL_TABLET | Freq: Four times a day (QID) | ORAL | Status: AC

## 2013-07-07 MED ORDER — SODIUM CHLORIDE 0.9 % IV BOLUS (SEPSIS)
1000.0000 mL | Freq: Once | INTRAVENOUS | Status: AC
  Administered 2013-07-07: 1000 mL via INTRAVENOUS

## 2013-07-07 NOTE — ED Notes (Signed)
Patient transported to radiology for CT tech for study.

## 2013-07-07 NOTE — ED Notes (Signed)
Patient is extremely upset he is only receiving po narcotics and threatening calling because we are not providing care.

## 2013-07-07 NOTE — ED Notes (Signed)
PATIENT WOULD LIKE TO SPEAK TO THE ED PROVIDER, HE IS HAS COMPLAINTS ABOUT MOST THINGS, TEMPERATURE OF ROOM, AMOUNT OF FLUIDS HE IS RECEIVING IV.  HE WOULD LIKE TO KNOW WHY HE IS GETTING NAUSEA MEDICINE AND HE WILL NOT LISTEN TO ANY ANSWERS I TRY AND GIVE HIM.  ED PROVIDER AWARE AT THIS TIME.

## 2013-07-07 NOTE — ED Notes (Signed)
He states he has pancreatitis with chronic abd pain. He has no medication to take for pain at home and today he started vomiting.

## 2013-07-07 NOTE — Discharge Instructions (Signed)
Please call your doctor for a followup appointment within 24-48 hours. When you talk to your doctor please let them know that you were seen in the emergency department and have them acquire all of your records so that they can discuss the findings with you and formulate a treatment plan to fully care for your new and ongoing problems. Please call and set-up an appointment with Dr. Aundra Dubin Please call Dr. Reeves Dam tomorrow  Please continue at home medications Please avoid any foods high in fat and grease Please continue to monitor symptoms closely and if symptoms are to worsen or change (fever greater than 101, chills, sweating, nausea, vomiting, chest pain, shortness of breath, difficulty breathing, inability to keep any food or fluids down, blood in the stools, black tarry stools, numbness, tingling) please report back to the ED immediately    Abdominal Pain Many things can cause abdominal pain. Usually, abdominal pain is not caused by a disease and will improve without treatment. It can often be observed and treated at home. Your health care provider will do a physical exam and possibly order blood tests and X-rays to help determine the seriousness of your pain. However, in many cases, more time must pass before a clear cause of the pain can be found. Before that point, your health care provider may not know if you need more testing or further treatment. HOME CARE INSTRUCTIONS  Monitor your abdominal pain for any changes. The following actions may help to alleviate any discomfort you are experiencing:  Only take over-the-counter or prescription medicines as directed by your health care provider.  Do not take laxatives unless directed to do so by your health care provider.  Try a clear liquid diet (broth, tea, or water) as directed by your health care provider. Slowly move to a bland diet as tolerated. SEEK MEDICAL CARE IF:  You have unexplained abdominal pain.  You have abdominal pain  associated with nausea or diarrhea.  You have pain when you urinate or have a bowel movement.  You experience abdominal pain that wakes you in the night.  You have abdominal pain that is worsened or improved by eating food.  You have abdominal pain that is worsened with eating fatty foods.  You have a fever. SEEK IMMEDIATE MEDICAL CARE IF:   Your pain does not go away within 2 hours.  You keep throwing up (vomiting).  Your pain is felt only in portions of the abdomen, such as the right side or the left lower portion of the abdomen.  You pass bloody or black tarry stools. MAKE SURE YOU:  Understand these instructions.   Will watch your condition.   Will get help right away if you are not doing well or get worse.  Document Released: 10/15/2004 Document Revised: 01/10/2013 Document Reviewed: 09/14/2012 [facility] Patient Information 2015 [place], [place]. This information is not intended to replace advice given to you by your health care provider. Make sure you discuss any questions you have with your health care provider.

## 2013-07-07 NOTE — ED Notes (Signed)
He is continueing to tell ultrasound tech that we do not provide care for him at this time.
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
## 2013-07-07 NOTE — ED Notes (Signed)
Patient is very upset about his medications, community liason is trying to work with patient to get him in his appointment sooner, but he is upset about medications and pain medicine.

## 2013-07-07 NOTE — Discharge Planning (Signed)
[place] Liaison  Patient is a current orange Conservation officer, historic buildings at [facility] where primary care has been established. Spoke with patient about upcoming appointments and past appointments with his pcp. Patient states he has not be able to get an appointment and the one he has isn't until the end of July, pt unaware of exact date. Patient has been educated several times about proper uses of the ED and his orange card. Patient expresses frustrations about being "blocked" from getting pain medications in the ED. Patient's pcp is closed at this time for an event, I will try obtaining a closer appointment with the clinic if possible. I will be in contact with the patient regarding his upcoming appointment with the practice.

## 2013-07-07 NOTE — ED Notes (Signed)
Patient states his pain level is 9. Pain is in the stomach

## 2013-07-07 NOTE — ED Notes (Signed)
Patient is complaining of pain and now said he would just like a prescription and go home.

## 2013-07-08 NOTE — ED Provider Notes (Signed)
Medical screening examination/treatment/procedure(s) were conducted as a shared visit with non-physician practitioner(s) and myself.  I personally evaluated the patient during the encounter.   EKG Interpretation   Date/Time:  Friday July 07 2013 12:17:04 EDT Ventricular Rate:  84 PR Interval:  164 QRS Duration: 84 QT Interval:  343 QTC Calculation: 405 R Axis:   -30 Text Interpretation:  Sinus rhythm Left axis deviation ST elevation,  consider inferior injury No significant change since last tracing  Confirmed by Maryan Rued  MD, Loree Fee ([id number]) on 07/07/2013 12:22:57 PM      Pt presents for his chronic issues of abd n/v and today no signs of exacerbation of chronic illness.  Imagine and labs all wnl.  Pt d/ced home after he was able to tolerate po percocet here.  He was given no IV or IM meds here.  Blanchie Dessert, MD 07/08/13 1255

## 2013-07-11 ENCOUNTER — Ambulatory Visit: Payer: Self-pay | Admitting: Internal Medicine

## 2013-07-20 ENCOUNTER — Ambulatory Visit: Payer: Self-pay | Admitting: Internal Medicine

## 2013-08-14 ENCOUNTER — Other Ambulatory Visit: Payer: Self-pay | Admitting: Internal Medicine

## 2013-08-14 DIAGNOSIS — I10 Essential (primary) hypertension: Secondary | ICD-10-CM

## 2013-08-14 MED ORDER — AMLODIPINE BESYLATE 10 MG PO TABS: 10.0000 mg | ORAL_TABLET | Freq: Every day | ORAL | Status: AC

## 2014-04-30 NOTE — Telephone Encounter (Signed)
Entered in error

## 2014-05-16 ENCOUNTER — Encounter (HOSPITAL_COMMUNITY): Payer: Self-pay

## 2014-05-16 ENCOUNTER — Emergency Department (INDEPENDENT_AMBULATORY_CARE_PROVIDER_SITE_OTHER)
Admission: EM | Admit: 2014-05-16 | Discharge: 2014-05-16 | Disposition: A | Discharge status: Home / Self Care | Payer: Medicaid Other | Source: Home / Self Care | Attending: Family Medicine | Admitting: Family Medicine

## 2014-05-16 DIAGNOSIS — S61219A Laceration without foreign body of unspecified finger without damage to nail, initial encounter: Secondary | ICD-10-CM

## 2014-05-16 MED ORDER — TETANUS-DIPHTH-ACELL PERTUSSIS 5-2.5-18.5 LF-MCG/0.5 IM SUSP
INTRAMUSCULAR | Status: AC
  Filled 2014-05-16: qty 0.5

## 2014-05-16 MED ORDER — POVIDONE-IODINE 10 % EX SOLN
CUTANEOUS | Status: AC
  Filled 2014-05-16: qty 118

## 2014-05-16 MED ORDER — CEPHALEXIN 500 MG PO CAPS: 500.0000 mg | ORAL_CAPSULE | Freq: Three times a day (TID) | ORAL | Status: DC

## 2014-05-16 MED ORDER — TETANUS-DIPHTH-ACELL PERTUSSIS 5-2.5-18.5 LF-MCG/0.5 IM SUSP
0.5000 mL | Freq: Once | INTRAMUSCULAR | Status: AC
  Administered 2014-05-16: 0.5 mL via INTRAMUSCULAR

## 2014-05-16 NOTE — ED Notes (Signed)
Patient states was sharpening a sling blade(rusty) earlier today and cut his right index finger Patient is a diabetic as well  As going through chemo at the present time Patient is not sure when his last tetanus was

## 2014-05-16 NOTE — Discharge Instructions (Signed)
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
## 2014-05-17 ENCOUNTER — Emergency Department (INDEPENDENT_AMBULATORY_CARE_PROVIDER_SITE_OTHER)
Admission: EM | Admit: 2014-05-17 | Discharge: 2014-05-17 | Disposition: A | Discharge status: Home / Self Care | Payer: Medicaid Other | Source: Home / Self Care | Attending: Emergency Medicine | Admitting: Emergency Medicine

## 2014-05-17 ENCOUNTER — Encounter (HOSPITAL_COMMUNITY): Payer: Self-pay | Admitting: Emergency Medicine

## 2014-05-17 DIAGNOSIS — S61219D Laceration without foreign body of unspecified finger without damage to nail, subsequent encounter: Secondary | ICD-10-CM

## 2014-05-17 MED ORDER — SULFAMETHOXAZOLE-TRIMETHOPRIM 800-160 MG PO TABS: 1.0000 | ORAL_TABLET | Freq: Two times a day (BID) | ORAL | Status: AC

## 2014-05-17 NOTE — Discharge Instructions (Signed)
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
## 2014-08-28 ENCOUNTER — Emergency Department (HOSPITAL_COMMUNITY): Payer: Medicaid Other

## 2014-08-28 ENCOUNTER — Observation Stay (HOSPITAL_COMMUNITY)
Admission: EM | Admit: 2014-08-28 | Discharge: 2014-08-29 | Disposition: A | Discharge status: Home / Self Care | Payer: Medicaid Other | Attending: Internal Medicine | Admitting: Internal Medicine

## 2014-08-28 ENCOUNTER — Encounter (HOSPITAL_COMMUNITY): Payer: Self-pay | Admitting: *Deleted

## 2014-08-28 ENCOUNTER — Other Ambulatory Visit (HOSPITAL_COMMUNITY): Payer: Medicaid Other

## 2014-08-28 DIAGNOSIS — Z792 Long term (current) use of antibiotics: Secondary | ICD-10-CM | POA: Insufficient documentation

## 2014-08-28 DIAGNOSIS — G8929 Other chronic pain: Secondary | ICD-10-CM | POA: Diagnosis present

## 2014-08-28 DIAGNOSIS — R1012 Left upper quadrant pain: Secondary | ICD-10-CM | POA: Diagnosis not present

## 2014-08-28 DIAGNOSIS — Z79899 Other long term (current) drug therapy: Secondary | ICD-10-CM | POA: Insufficient documentation

## 2014-08-28 DIAGNOSIS — N189 Chronic kidney disease, unspecified: Secondary | ICD-10-CM | POA: Insufficient documentation

## 2014-08-28 DIAGNOSIS — R768 Other specified abnormal immunological findings in serum: Secondary | ICD-10-CM | POA: Diagnosis present

## 2014-08-28 DIAGNOSIS — Z794 Long term (current) use of insulin: Secondary | ICD-10-CM

## 2014-08-28 DIAGNOSIS — Z8507 Personal history of malignant neoplasm of pancreas: Secondary | ICD-10-CM | POA: Insufficient documentation

## 2014-08-28 DIAGNOSIS — Z720 Tobacco use: Secondary | ICD-10-CM | POA: Insufficient documentation

## 2014-08-28 DIAGNOSIS — IMO0001 Reserved for inherently not codable concepts without codable children: Secondary | ICD-10-CM | POA: Diagnosis present

## 2014-08-28 DIAGNOSIS — Z8619 Personal history of other infectious and parasitic diseases: Secondary | ICD-10-CM | POA: Insufficient documentation

## 2014-08-28 DIAGNOSIS — G43909 Migraine, unspecified, not intractable, without status migrainosus: Secondary | ICD-10-CM | POA: Insufficient documentation

## 2014-08-28 DIAGNOSIS — R319 Hematuria, unspecified: Secondary | ICD-10-CM

## 2014-08-28 DIAGNOSIS — E109 Type 1 diabetes mellitus without complications: Secondary | ICD-10-CM | POA: Diagnosis not present

## 2014-08-28 DIAGNOSIS — I209 Angina pectoris, unspecified: Secondary | ICD-10-CM | POA: Insufficient documentation

## 2014-08-28 DIAGNOSIS — I129 Hypertensive chronic kidney disease with stage 1 through stage 4 chronic kidney disease, or unspecified chronic kidney disease: Secondary | ICD-10-CM | POA: Insufficient documentation

## 2014-08-28 DIAGNOSIS — E119 Type 2 diabetes mellitus without complications: Secondary | ICD-10-CM | POA: Insufficient documentation

## 2014-08-28 DIAGNOSIS — J45909 Unspecified asthma, uncomplicated: Secondary | ICD-10-CM | POA: Insufficient documentation

## 2014-08-28 DIAGNOSIS — N411 Chronic prostatitis: Secondary | ICD-10-CM | POA: Diagnosis present

## 2014-08-28 DIAGNOSIS — K219 Gastro-esophageal reflux disease without esophagitis: Secondary | ICD-10-CM | POA: Insufficient documentation

## 2014-08-28 DIAGNOSIS — I10 Essential (primary) hypertension: Secondary | ICD-10-CM | POA: Diagnosis present

## 2014-08-28 DIAGNOSIS — E162 Hypoglycemia, unspecified: Secondary | ICD-10-CM | POA: Insufficient documentation

## 2014-08-28 DIAGNOSIS — F411 Generalized anxiety disorder: Secondary | ICD-10-CM | POA: Diagnosis present

## 2014-08-28 DIAGNOSIS — F17200 Nicotine dependence, unspecified, uncomplicated: Secondary | ICD-10-CM | POA: Diagnosis present

## 2014-08-28 DIAGNOSIS — R079 Chest pain, unspecified: Principal | ICD-10-CM | POA: Diagnosis present

## 2014-08-28 DIAGNOSIS — G629 Polyneuropathy, unspecified: Secondary | ICD-10-CM | POA: Insufficient documentation

## 2014-08-28 DIAGNOSIS — K859 Acute pancreatitis, unspecified: Secondary | ICD-10-CM

## 2014-08-28 DIAGNOSIS — I2510 Atherosclerotic heart disease of native coronary artery without angina pectoris: Secondary | ICD-10-CM | POA: Diagnosis present

## 2014-08-28 DIAGNOSIS — K862 Cyst of pancreas: Secondary | ICD-10-CM | POA: Insufficient documentation

## 2014-08-28 HISTORY — DX: Type 2 diabetes mellitus without complications: E11.9

## 2014-08-28 HISTORY — DX: Polyneuropathy, unspecified: G62.9

## 2014-08-28 HISTORY — DX: Malignant neoplasm of pancreas, unspecified: C25.9

## 2014-08-28 LAB — CBC
HCT: 43.8 % (ref 39.0–52.0)
HEMOGLOBIN: 15 g/dL (ref 13.0–17.0)
MCH: 32.3 pg (ref 26.0–34.0)
MCHC: 34.2 g/dL (ref 30.0–36.0)
MCV: 94.4 fL (ref 78.0–100.0)
Platelets: 275 10*3/uL (ref 150–400)
RBC: 4.64 MIL/uL (ref 4.22–5.81)
RDW: 13.8 % (ref 11.5–15.5)
WBC: 8.4 10*3/uL (ref 4.0–10.5)

## 2014-08-28 LAB — GLUCOSE, CAPILLARY
GLUCOSE-CAPILLARY: 154 mg/dL — AB (ref 65–99)
Glucose-Capillary: 55 mg/dL — ABNORMAL LOW (ref 65–99)
Glucose-Capillary: 180 mg/dL — ABNORMAL HIGH (ref 65–99)

## 2014-08-28 LAB — TROPONIN I
Troponin I: <0.03 ng/mL (ref <0.031)
Troponin I: <0.03 ng/mL (ref <0.031)
Troponin I: <0.03 ng/mL (ref <0.031)

## 2014-08-28 LAB — BASIC METABOLIC PANEL
Anion gap: 10 (ref 5–15)
BUN: 15 mg/dL (ref 6–20)
CO2: 27 mmol/L (ref 22–32)
Calcium: 9.6 mg/dL (ref 8.9–10.3)
Chloride: 99 mmol/L — ABNORMAL LOW (ref 101–111)
Creatinine, Ser: 1.14 mg/dL (ref 0.61–1.24)
GFR calc non Af Amer: >60 mL/min (ref >60)
GLUCOSE: 143 mg/dL — AB (ref 65–99)
POTASSIUM: 4.1 mmol/L (ref 3.5–5.1)
Sodium: 136 mmol/L (ref 135–145)

## 2014-08-28 LAB — HEPATIC FUNCTION PANEL
ALT: 35 U/L (ref 17–63)
AST: 33 U/L (ref 15–41)
Albumin: 3.4 g/dL — ABNORMAL LOW (ref 3.5–5.0)
Alkaline Phosphatase: 122 U/L (ref 38–126)
BILIRUBIN INDIRECT: 0.6 mg/dL (ref 0.3–0.9)
Bilirubin, Direct: 0.3 mg/dL (ref 0.1–0.5)
TOTAL PROTEIN: 6.6 g/dL (ref 6.5–8.1)
Total Bilirubin: 0.9 mg/dL (ref 0.3–1.2)

## 2014-08-28 LAB — CBG MONITORING, ED
GLUCOSE-CAPILLARY: 135 mg/dL — AB (ref 65–99)
Glucose-Capillary: 138 mg/dL — ABNORMAL HIGH (ref 65–99)

## 2014-08-28 LAB — I-STAT TROPONIN, ED: Troponin i, poc: 0 ng/mL (ref 0.00–0.08)

## 2014-08-28 LAB — PROTIME-INR
INR: 0.94 (ref 0.00–1.49)
Prothrombin Time: 12.7 seconds (ref 11.6–15.2)

## 2014-08-28 LAB — LIPASE, BLOOD: LIPASE: 126 U/L — AB (ref 22–51)

## 2014-08-28 MED ORDER — OXYBUTYNIN CHLORIDE ER 5 MG PO TB24: 5.0000 mg | ORAL_TABLET | Freq: Every day | ORAL | Status: DC

## 2014-08-28 MED ORDER — ALBUTEROL SULFATE HFA 108 (90 BASE) MCG/ACT IN AERS: 1.0000 | INHALATION_SPRAY | Freq: Four times a day (QID) | RESPIRATORY_TRACT | Status: DC | PRN

## 2014-08-28 MED ORDER — METOCLOPRAMIDE HCL 5 MG/ML IJ SOLN
10.0000 mg | Freq: Once | INTRAMUSCULAR | Status: AC
  Administered 2014-08-28: 10 mg via INTRAVENOUS
  Filled 2014-08-28: qty 2

## 2014-08-28 MED ORDER — IOHEXOL 350 MG/ML SOLN
100.0000 mL | Freq: Once | INTRAVENOUS | Status: AC | PRN
  Administered 2014-08-28: 80 mL via INTRAVENOUS

## 2014-08-28 MED ORDER — ACETAMINOPHEN 325 MG PO TABS: 650.0000 mg | ORAL_TABLET | Freq: Once | ORAL | Status: DC

## 2014-08-28 MED ORDER — INSULIN ASPART 100 UNIT/ML ~~LOC~~ SOLN: 0.0000 [IU] | Freq: Every day | SUBCUTANEOUS | Status: DC

## 2014-08-28 MED ORDER — NICOTINE 14 MG/24HR TD PT24
14.0000 mg | MEDICATED_PATCH | Freq: Every day | TRANSDERMAL | Status: DC
Start: 2014-08-28 — End: 2014-08-29
  Administered 2014-08-28 – 2014-08-29 (×2): 14 mg via TRANSDERMAL
  Filled 2014-08-28 (×3): qty 1

## 2014-08-28 MED ORDER — ONDANSETRON HCL 4 MG PO TABS: 4.0000 mg | ORAL_TABLET | Freq: Four times a day (QID) | ORAL | Status: DC | PRN

## 2014-08-28 MED ORDER — INSULIN ASPART 100 UNIT/ML ~~LOC~~ SOLN
0.0000 [IU] | Freq: Three times a day (TID) | SUBCUTANEOUS | Status: DC
  Administered 2014-08-28: 2 [IU] via SUBCUTANEOUS
  Filled 2014-08-28: qty 1

## 2014-08-28 MED ORDER — AMLODIPINE BESYLATE 10 MG PO TABS
10.0000 mg | ORAL_TABLET | Freq: Every day | ORAL | Status: DC
  Administered 2014-08-28 – 2014-08-29 (×2): 10 mg via ORAL
  Filled 2014-08-28: qty 2
  Filled 2014-08-28: qty 1

## 2014-08-28 MED ORDER — MORPHINE SULFATE 4 MG/ML IJ SOLN
6.0000 mg | Freq: Once | INTRAMUSCULAR | Status: AC
  Administered 2014-08-28: 6 mg via INTRAVENOUS
  Filled 2014-08-28: qty 2

## 2014-08-28 MED ORDER — PREGABALIN 50 MG PO CAPS
50.0000 mg | ORAL_CAPSULE | Freq: Two times a day (BID) | ORAL | Status: DC
  Administered 2014-08-28 – 2014-08-29 (×2): 50 mg via ORAL
  Filled 2014-08-28 (×3): qty 1

## 2014-08-28 MED ORDER — OXYCODONE HCL 5 MG PO TABS
15.0000 mg | ORAL_TABLET | ORAL | Status: DC
  Administered 2014-08-28 – 2014-08-29 (×6): 15 mg via ORAL
  Filled 2014-08-28 (×6): qty 3

## 2014-08-28 MED ORDER — MORPHINE SULFATE 4 MG/ML IJ SOLN
4.0000 mg | Freq: Once | INTRAMUSCULAR | Status: AC
  Administered 2014-08-28: 4 mg via INTRAVENOUS
  Filled 2014-08-28: qty 1

## 2014-08-28 MED ORDER — ENOXAPARIN SODIUM 40 MG/0.4ML ~~LOC~~ SOLN
40.0000 mg | Freq: Every day | SUBCUTANEOUS | Status: DC
  Administered 2014-08-28 – 2014-08-29 (×2): 40 mg via SUBCUTANEOUS
  Filled 2014-08-28 (×2): qty 0.4

## 2014-08-28 MED ORDER — ALPRAZOLAM 0.5 MG PO TABS: 0.5000 mg | ORAL_TABLET | Freq: Three times a day (TID) | ORAL | Status: DC | PRN

## 2014-08-28 MED ORDER — ACETAMINOPHEN 325 MG PO TABS: 650.0000 mg | ORAL_TABLET | ORAL | Status: DC | PRN

## 2014-08-28 MED ORDER — OXYBUTYNIN CHLORIDE 5 MG PO TABS
5.0000 mg | ORAL_TABLET | Freq: Three times a day (TID) | ORAL | Status: DC
  Administered 2014-08-28 – 2014-08-29 (×3): 5 mg via ORAL
  Filled 2014-08-28 (×4): qty 1

## 2014-08-28 MED ORDER — NITROGLYCERIN 0.4 MG SL SUBL
0.4000 mg | SUBLINGUAL_TABLET | SUBLINGUAL | Status: AC | PRN
  Administered 2014-08-28 (×3): 0.4 mg via SUBLINGUAL
  Filled 2014-08-28: qty 1

## 2014-08-28 MED ORDER — LORAZEPAM 2 MG/ML IJ SOLN
0.5000 mg | Freq: Once | INTRAMUSCULAR | Status: AC
  Administered 2014-08-28: 0.5 mg via INTRAVENOUS
  Filled 2014-08-28: qty 1

## 2014-08-28 MED ORDER — TAMSULOSIN HCL 0.4 MG PO CAPS
0.4000 mg | ORAL_CAPSULE | Freq: Every day | ORAL | Status: DC
  Administered 2014-08-28 – 2014-08-29 (×2): 0.4 mg via ORAL
  Filled 2014-08-28 (×2): qty 1

## 2014-08-28 MED ORDER — HYDROCHLOROTHIAZIDE 25 MG PO TABS
25.0000 mg | ORAL_TABLET | Freq: Every day | ORAL | Status: DC
  Administered 2014-08-28: 25 mg via ORAL
  Filled 2014-08-28: qty 1

## 2014-08-28 MED ORDER — INSULIN GLARGINE 100 UNIT/ML ~~LOC~~ SOLN
12.0000 [IU] | Freq: Every day | SUBCUTANEOUS | Status: DC
  Filled 2014-08-28 (×2): qty 0.12

## 2014-08-28 MED ORDER — ONDANSETRON HCL 4 MG/2ML IJ SOLN: 4.0000 mg | Freq: Four times a day (QID) | INTRAMUSCULAR | Status: DC | PRN

## 2014-08-28 MED ORDER — INSULIN GLARGINE 100 UNIT/ML SOLOSTAR PEN: 12.0000 [IU] | PEN_INJECTOR | Freq: Every day | SUBCUTANEOUS | Status: DC

## 2014-08-28 NOTE — ED Notes (Signed)
Pt informed of clear liquid diet, informed pt on reasoning. Verbalized understanding.

## 2014-08-28 NOTE — ED Notes (Signed)
Patient reports onset of chest pain, left arm numbness, and throat numbness.  He is also nauseated.  Patient also has hx of pancreatic cancer.  Patient states he has headache as well.  Patient denies hx of blood clots but states he has been checked for same.  He states he has had some swelling in his legs.  Patient states he is seen by [facility].  Patient denies recent travel.  Denies trauma.  Patient does not have a port access at this time.  He took his pain medication 3 hours ago, oxy 15.
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
## 2014-08-28 NOTE — ED Notes (Addendum)
Pt is requesting to speak to Dr. Mingo Amber after he takes the 3 nitroglycerin tablets. This RN has repeatedly explained to the pt that Dr. Mingo Amber would like the pt to take 3 nitro tabs prior to morphine and that the pt would not be going to his CT scan without some type of pain resolution.
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
## 2014-08-29 DIAGNOSIS — F411 Generalized anxiety disorder: Secondary | ICD-10-CM

## 2014-08-29 DIAGNOSIS — R1012 Left upper quadrant pain: Secondary | ICD-10-CM | POA: Diagnosis not present

## 2014-08-29 DIAGNOSIS — N411 Chronic prostatitis: Secondary | ICD-10-CM | POA: Diagnosis not present

## 2014-08-29 DIAGNOSIS — R079 Chest pain, unspecified: Secondary | ICD-10-CM | POA: Diagnosis not present

## 2014-08-29 DIAGNOSIS — G8929 Other chronic pain: Secondary | ICD-10-CM

## 2014-08-29 DIAGNOSIS — E109 Type 1 diabetes mellitus without complications: Secondary | ICD-10-CM | POA: Diagnosis not present

## 2014-08-29 LAB — COMPREHENSIVE METABOLIC PANEL
ALT: 32 U/L (ref 17–63)
AST: 32 U/L (ref 15–41)
Albumin: 3.3 g/dL — ABNORMAL LOW (ref 3.5–5.0)
Alkaline Phosphatase: 110 U/L (ref 38–126)
Anion gap: 8 (ref 5–15)
BILIRUBIN TOTAL: 1.1 mg/dL (ref 0.3–1.2)
BUN: 6 mg/dL (ref 6–20)
CALCIUM: 9.3 mg/dL (ref 8.9–10.3)
CO2: 32 mmol/L (ref 22–32)
CREATININE: 1.17 mg/dL (ref 0.61–1.24)
Chloride: 96 mmol/L — ABNORMAL LOW (ref 101–111)
GFR calc Af Amer: >60 mL/min (ref >60)
GFR calc non Af Amer: >60 mL/min (ref >60)
Glucose, Bld: 153 mg/dL — ABNORMAL HIGH (ref 65–99)
Potassium: 4.2 mmol/L (ref 3.5–5.1)
SODIUM: 136 mmol/L (ref 135–145)
Total Protein: 6.4 g/dL — ABNORMAL LOW (ref 6.5–8.1)

## 2014-08-29 LAB — GLUCOSE, CAPILLARY
GLUCOSE-CAPILLARY: 183 mg/dL — AB (ref 65–99)
GLUCOSE-CAPILLARY: 93 mg/dL (ref 65–99)
Glucose-Capillary: 119 mg/dL — ABNORMAL HIGH (ref 65–99)
Glucose-Capillary: 148 mg/dL — ABNORMAL HIGH (ref 65–99)

## 2014-08-29 LAB — LIPASE, BLOOD: Lipase: 48 U/L (ref 22–51)

## 2014-08-29 NOTE — Discharge Instructions (Signed)

## 2014-08-29 NOTE — Discharge Summary (Signed)
Physician Discharge Summary   Patient ID: Jerry Terrell MRN: [id number] DOB/AGE: March 20, 1960 54 y.o.  Admit date: 08/28/2014 Discharge date: 08/29/2014  Primary Care Physician:  No primary care provider on file.  Discharge Diagnoses:   . Abdominal pain, mild acute on chronic, left upper quadrant . atypical Chest pain . HTN (hypertension) . TOBACCO ABUSE . CAD, NATIVE VESSEL . HCV antibody positive . Pancreatic cyst . Generalized anxiety disorder . Chronic prostatitis with hematuria . History of stage III pancreatic adenoCA s/p Whipple  . Diabetes mellitus, insulin dependent (IDDM), controlled  Consults:  Cardiology   Recommendations for Outpatient Follow-up:  Patient was reminded to follow up with his PCP, obtain CBC, BMET   DIET: Carb modified diet    Allergies:   Allergies  Allergen Reactions  . Gabapentin Swelling  . Aspirin Other (See Comments)    Upset stomach  . Dilaudid [Hydromorphone] Other (See Comments)    Hallucinations   . Diphenhydramine Hcl     REACTION: itching, "joints and skin crawling" - swelling  . Ibuprofen Other (See Comments)    Upset stomach  . Tolmetin Other (See Comments)    Joint aches  . Tramadol Itching and Other (See Comments)    Skin crawling, joint aches   . Benadryl [Diphenhydramine] Other (See Comments)    Restless legs  . Levaquin [Levofloxacin In D5w] Swelling, Rash and Other (See Comments)    Joint aches  . Nsaids Other (See Comments)    Upset stomach      Discharge Medications:   Medication List    TAKE these medications        acyclovir 400 MG tablet  Commonly known as:  ZOVIRAX  Take 800 mg by mouth 2 (two) times daily as needed (break out).     albuterol 108 (90 BASE) MCG/ACT inhaler  Commonly known as:  PROVENTIL HFA;VENTOLIN HFA  Inhale 1 puff into the lungs every 6 (six) hours as needed for wheezing or shortness of breath.     ALPRAZolam 0.5 MG tablet  Commonly known as:  XANAX  Take 0.5 mg by mouth 3  (three) times daily as needed for anxiety.     amLODipine 10 MG tablet  Commonly known as:  NORVASC  Take 1 tablet (10 mg total) by mouth daily. **BRAND NAME ONLY**     bisacodyl 5 MG EC tablet  Commonly known as:  DULCOLAX  Take 5 mg by mouth at bedtime as needed for moderate constipation.     hydrochlorothiazide 25 MG tablet  Commonly known as:  HYDRODIURIL  Take 1 tablet (25 mg total) by mouth at bedtime.     LANTUS SOLOSTAR 100 UNIT/ML Solostar Pen  Generic drug:  Insulin Glargine  Inject 12 Units into the skin daily at 10 pm.     nicotine 14 mg/24hr patch  Commonly known as:  NICODERM CQ - dosed in mg/24 hours  Place 14 mg onto the skin daily.     NOVOLOG FLEXPEN 100 UNIT/ML FlexPen  Generic drug:  insulin aspart  Inject 3 Units into the skin 3 (three) times daily with meals.     ondansetron 4 MG tablet  Commonly known as:  ZOFRAN  Take 1 tablet (4 mg total) by mouth every 6 (six) hours.     oxybutynin 5 MG tablet  Commonly known as:  DITROPAN  Take 5 mg by mouth 3 (three) times daily.     oxyCODONE 15 MG immediate release tablet  Commonly known as:  ROXICODONE  Take 15 mg by mouth every 4 (four) hours.     pregabalin 50 MG capsule  Commonly known as:  LYRICA  Take 50 mg by mouth 2 (two) times daily.     tadalafil 20 MG tablet  Commonly known as:  CIALIS  Take 20 mg by mouth daily as needed for erectile dysfunction.     tamsulosin 0.4 MG Caps capsule  Commonly known as:  FLOMAX  Take 0.4 mg by mouth daily.         Brief H and P: For complete details please refer to admission H and P, but in brief 54 year old male with stage III adenocarcinoma of pancreas status post Whipple's procedure, followed by oncology at [facility] presented to ED with left anterior substernal chest pain, radiating from the wrist to the arm and neck and side of the face, nausea but no other symptoms. In ED, patient was afebrile, vital signs stable, EKG unremarkable,  troponin negative, chest x-ray with minimal bibasilar atelectasis EKG gram of the chest with no PE. Patient was admitted for further workup.  Hospital Course:  Atypical chest pain with CAD, epigastric pain with underlying history of pancreatic cancer status post Whipple's procedure - Patient was admitted for further workup, chest pain had completely resolved, ECG showed no acute ST-T wave changes suggestive of ischemia. Cardiac enzymes remained negative. Patient was seen by cardiology, Dr. Sallyanne Kuster patient had cardiac cath in 2008 which was negative, normal coronaries. No visible calcifications in the coronary arteries on chest CT. Per cardiology, overall likelihood of coronary etiology of chest pain is low and no plan for additional cardiac workup at this time.   Mild acute on chronic pancreatitis   Lipase was 126 at the time of admission. Patient was placed on nothing by mouth status, pain control. Currently resolved, lipase 48, patient is tolerating solid diet without any difficulty.   patient also follows with [facility] in [place].  Hypertension Currently BP well controlled, continue Norvasc and Hydrocort thiazide  Tobacco abuse Patient counseled on smoking cessation  Day of Discharge BP 117/73 mmHg  Pulse 58  Temp(Src) 98.5 F (36.9 C) (Oral)  Resp 18  Ht 5\' 8"  (1.727 m)  Wt 68.04 kg (150 lb)  BMI 22.81 kg/m2  SpO2 97%  Physical Exam: General: Alert and awake oriented x3 not in any acute distress. HEENT: anicteric sclera, pupils reactive to light and accommodation CVS: S1-S2 clear no murmur rubs or gallops Chest: clear to auscultation bilaterally, no wheezing rales or rhonchi Abdomen: soft , chronic minimal tenderness in the left quadrant. Nndistended, normal bowel sounds Extremities: no cyanosis, clubbing or edema noted bilaterally Neuro: Cranial nerves II-XII intact, no focal neurological deficits   The results of significant diagnostics from this hospitalization  (including imaging, microbiology, ancillary and laboratory) are listed below for reference.    LAB RESULTS: Basic Metabolic Panel:  Recent Labs Lab 08/28/14 0351 08/29/14 0908  NA 136 136  K 4.1 4.2  CL 99* 96*  CO2 27 32  GLUCOSE 143* 153*  BUN 15 6  CREATININE 1.14 1.17  CALCIUM 9.6 9.3   Liver Function Tests:  Recent Labs Lab 08/28/14 0908 08/29/14 0908  AST 33 32  ALT 35 32  ALKPHOS 122 110  BILITOT 0.9 1.1  PROT 6.6 6.4*  ALBUMIN 3.4* 3.3*    Recent Labs Lab 08/28/14 0351 08/29/14 0408  LIPASE 126* 48   No results for input(s): AMMONIA in the last 168 hours. CBC:  Recent  Labs Lab 08/28/14 0430  WBC 8.4  HGB 15.0  HCT 43.8  MCV 94.4  PLT 275   Cardiac Enzymes:  Recent Labs Lab 08/28/14 1529 08/28/14 2124  TROPONINI <0.03 <0.03   BNP: Invalid input(s): POCBNP CBG:  Recent Labs Lab 08/29/14 0730 08/29/14 1137  GLUCAP 148* 183*    Significant Diagnostic Studies:  Dg Chest 2 View  08/28/2014   CLINICAL DATA:  Acute onset of right arm numbness and generalized chest pain. Left leg numbness. Initial encounter.  EXAM: CHEST  2 VIEW  COMPARISON:  Chest radiograph performed 05/07/2013  FINDINGS: The lungs are well-aerated. Minimal bibasilar atelectasis is noted. There is no evidence of pleural effusion or pneumothorax.  The heart is normal in size; the mediastinal contour is within normal limits. No acute osseous abnormalities are seen.  IMPRESSION: Minimal bibasilar atelectasis noted; lungs otherwise clear.   Electronically Signed   By: Garald Balding M.D.   On: 08/28/2014 04:53   Ct Angio Chest Pe W/cm &/or Wo Cm  08/28/2014   CLINICAL DATA:  Acute onset of mild shortness of breath and generalized chest pain. Leg swelling. Initial encounter.  EXAM: CT ANGIOGRAPHY CHEST WITH CONTRAST  TECHNIQUE: Multidetector CT imaging of the chest was performed using the standard protocol during bolus administration of intravenous contrast. Multiplanar CT image  reconstructions and MIPs were obtained to evaluate the vascular anatomy.  CONTRAST:  19mL OMNIPAQUE IOHEXOL 350 MG/ML SOLN  COMPARISON:  Chest radiograph performed earlier today at 3:46 a.m., and CT of the chest performed 09/08/2006. CT of the abdomen and pelvis performed 07/07/2013  FINDINGS: There is no evidence of pulmonary embolus.  Minimal bibasilar atelectasis is noted. The lungs are otherwise clear. There is no evidence of significant focal consolidation, pleural effusion or pneumothorax. No masses are identified; no abnormal focal contrast enhancement is seen.  The mediastinum is unremarkable in appearance. No mediastinal lymphadenopathy is seen. No pericardial effusion is identified. The great vessels are grossly unremarkable in appearance. No axillary lymphadenopathy is seen. The visualized portions of the thyroid gland are unremarkable in appearance.  The visualized portions of the liver are unremarkable. Since prior studies, there has been marked atrophy of the spleen, with centrally decreased attenuation, of uncertain significance. A new 2.6 cm cystic lesion is noted at the tail of the pancreas, with surrounding calcification, of uncertain significance. The visualized portions of the adrenal glands and kidneys are grossly unremarkable.  No acute osseous abnormalities are seen.  Review of the MIP images confirms the above findings.  IMPRESSION: 1. No evidence of pulmonary embolus. 2. Minimal bibasilar atelectasis noted; lungs otherwise clear. 3. New 2.6 cm cystic lesion at the tail of the pancreas, with surrounding calcification, of uncertain significance. MRCP is recommended for further evaluation, if the patient is able to hold his breath for the study. 4. New marked atrophy of the spleen, with centrally decreased attenuation, of uncertain significance. Would correlate with the patient's recent clinical history.   Electronically Signed   By: Garald Balding M.D.   On: 08/28/2014 06:31    2D  ECHO:   Disposition and Follow-up:     Discharge Instructions    Diet Carb Modified    Complete by:  As directed      Discharge instructions    Complete by:  As directed   Please eat low fat diet. Please discuss with your primary physician regarding hydrochlorothiazide.     Increase activity slowly    Complete by:  As directed  DISPOSITION: Home   DISCHARGE FOLLOW-UP Follow-up Information    Schedule an appointment as soon as possible for a visit in 2 weeks to follow up.   Why:  for hospital follow-up   Contact information:   please follow with your PCP at [facility].        Time spent on Discharge: 25 minutes   Signed:   Alejos Reinhardt M.D. [facility] 08/29/2014, 12:54 PM Pager: [phone]

## 2014-08-29 NOTE — Progress Notes (Signed)
Admitted with epigastric and atypical left chest pain. S/P Whipple procedure for pancreatic cancer, now with possible pseudocyst/recurrence of malignancy. Low risk ECG and negative cardiac enzymes despite constant pain of long duration. Previous normal coronary angio, albeit a while back:  "Cardiac Cath  Procedure date: 12/27/2006  Findings:  CORONARY ANATOMY: The left main coronary artery was normal.    Left anterior descending coronary artery: The left anterior descending  coronary artery was also normal.    Left circumflex coronary artery: The left circumflex coronary artery  was normal.    Right coronary artery: The right coronary was dominant and his  posterolateral branch and posterior descending coronary arteries were  also normal.    LEFT VENTRICULOGRAM: The left ventriculogram showed mild apical  hypokinesia with ejection fraction of 60%.    IMPRESSION:  1. Normal coronaries.  2. Preserved left ventricular systolic function."    Also noted is the absence of any visible calcification in the coronary arteries on CT chest. Overall, likelihood of coronary etiology of chest pain is very low. No plan for additional cardiac workup at this time.  Sanda Klein, MD, [facility] [phone] office [phone] pager

## 2014-09-30 ENCOUNTER — Encounter (HOSPITAL_COMMUNITY): Payer: Self-pay | Admitting: Emergency Medicine

## 2014-09-30 ENCOUNTER — Emergency Department (HOSPITAL_COMMUNITY): Payer: Medicaid Other

## 2014-09-30 ENCOUNTER — Emergency Department (HOSPITAL_COMMUNITY)
Admission: EM | Admit: 2014-09-30 | Discharge: 2014-09-30 | Disposition: A | Discharge status: Home / Self Care | Payer: Medicaid Other | Attending: Emergency Medicine | Admitting: Emergency Medicine

## 2014-09-30 DIAGNOSIS — K858 Other acute pancreatitis without necrosis or infection: Secondary | ICD-10-CM

## 2014-09-30 DIAGNOSIS — N189 Chronic kidney disease, unspecified: Secondary | ICD-10-CM | POA: Diagnosis not present

## 2014-09-30 DIAGNOSIS — Z8507 Personal history of malignant neoplasm of pancreas: Secondary | ICD-10-CM | POA: Diagnosis not present

## 2014-09-30 DIAGNOSIS — Z794 Long term (current) use of insulin: Secondary | ICD-10-CM | POA: Insufficient documentation

## 2014-09-30 DIAGNOSIS — E119 Type 2 diabetes mellitus without complications: Secondary | ICD-10-CM | POA: Insufficient documentation

## 2014-09-30 DIAGNOSIS — G8929 Other chronic pain: Secondary | ICD-10-CM | POA: Diagnosis not present

## 2014-09-30 DIAGNOSIS — Z87438 Personal history of other diseases of male genital organs: Secondary | ICD-10-CM | POA: Diagnosis not present

## 2014-09-30 DIAGNOSIS — I129 Hypertensive chronic kidney disease with stage 1 through stage 4 chronic kidney disease, or unspecified chronic kidney disease: Secondary | ICD-10-CM | POA: Diagnosis not present

## 2014-09-30 DIAGNOSIS — Z79899 Other long term (current) drug therapy: Secondary | ICD-10-CM | POA: Diagnosis not present

## 2014-09-30 DIAGNOSIS — Z8619 Personal history of other infectious and parasitic diseases: Secondary | ICD-10-CM | POA: Diagnosis not present

## 2014-09-30 DIAGNOSIS — J45909 Unspecified asthma, uncomplicated: Secondary | ICD-10-CM | POA: Insufficient documentation

## 2014-09-30 DIAGNOSIS — Z720 Tobacco use: Secondary | ICD-10-CM | POA: Insufficient documentation

## 2014-09-30 DIAGNOSIS — R079 Chest pain, unspecified: Secondary | ICD-10-CM | POA: Diagnosis present

## 2014-09-30 LAB — BASIC METABOLIC PANEL
Anion gap: 6 (ref 5–15)
BUN: 14 mg/dL (ref 6–20)
CHLORIDE: 102 mmol/L (ref 101–111)
CO2: 30 mmol/L (ref 22–32)
Calcium: 9.7 mg/dL (ref 8.9–10.3)
Creatinine, Ser: 1.14 mg/dL (ref 0.61–1.24)
GFR calc Af Amer: >60 mL/min (ref >60)
GFR calc non Af Amer: >60 mL/min (ref >60)
GLUCOSE: 145 mg/dL — AB (ref 65–99)
Potassium: 4.5 mmol/L (ref 3.5–5.1)
Sodium: 138 mmol/L (ref 135–145)

## 2014-09-30 LAB — HEPATIC FUNCTION PANEL
ALK PHOS: 135 U/L — AB (ref 38–126)
ALT: 34 U/L (ref 17–63)
AST: 35 U/L (ref 15–41)
Albumin: 3.7 g/dL (ref 3.5–5.0)
BILIRUBIN TOTAL: 0.9 mg/dL (ref 0.3–1.2)
Bilirubin, Direct: 0.3 mg/dL (ref 0.1–0.5)
Indirect Bilirubin: 0.6 mg/dL (ref 0.3–0.9)
Total Protein: 7 g/dL (ref 6.5–8.1)

## 2014-09-30 LAB — CBC
HEMATOCRIT: 44.1 % (ref 39.0–52.0)
Hemoglobin: 15.3 g/dL (ref 13.0–17.0)
MCH: 32.6 pg (ref 26.0–34.0)
MCHC: 34.7 g/dL (ref 30.0–36.0)
MCV: 93.8 fL (ref 78.0–100.0)
Platelets: 234 10*3/uL (ref 150–400)
RBC: 4.7 MIL/uL (ref 4.22–5.81)
RDW: 14 % (ref 11.5–15.5)
WBC: 8.2 10*3/uL (ref 4.0–10.5)

## 2014-09-30 LAB — I-STAT TROPONIN, ED: Troponin i, poc: 0 ng/mL (ref 0.00–0.08)

## 2014-09-30 LAB — LIPASE, BLOOD: Lipase: 159 U/L — ABNORMAL HIGH (ref 22–51)

## 2014-09-30 MED ORDER — MORPHINE SULFATE (PF) 4 MG/ML IV SOLN
INTRAVENOUS | Status: AC
  Filled 2014-09-30: qty 1

## 2014-09-30 MED ORDER — MORPHINE SULFATE (PF) 4 MG/ML IV SOLN
4.0000 mg | Freq: Once | INTRAVENOUS | Status: AC
  Administered 2014-09-30: 4 mg via INTRAVENOUS
  Filled 2014-09-30: qty 1

## 2014-09-30 MED ORDER — ONDANSETRON HCL 4 MG/2ML IJ SOLN
4.0000 mg | Freq: Once | INTRAMUSCULAR | Status: AC
  Administered 2014-09-30: 4 mg via INTRAVENOUS
  Filled 2014-09-30: qty 2

## 2014-09-30 NOTE — ED Notes (Signed)
Patient here with complaint of left chest pain states onset @ 0200. Reports previous history of chest pain, but never the shooting type pain he is currently experiencing. Denies history of heart problems but states currently has pancreatic cancer.

## 2014-09-30 NOTE — Discharge Instructions (Signed)
Acute Pancreatitis Acute pancreatitis is a disease in which the pancreas becomes suddenly inflamed. The pancreas is a large gland located behind your stomach. The pancreas produces enzymes that help digest food. The pancreas also releases the hormones glucagon and insulin that help regulate blood sugar. Damage to the pancreas occurs when the digestive enzymes from the pancreas are activated and begin attacking the pancreas before being released into the intestine. Most acute attacks last a couple of days and can cause serious complications. Some people become dehydrated and develop low blood pressure. In severe cases, bleeding into the pancreas can lead to shock and can be life-threatening. The lungs, heart, and kidneys may fail. CAUSES  Pancreatitis can happen to anyone. In some cases, the cause is unknown. Most cases are caused by:  Alcohol abuse.  Gallstones. Other less common causes are:  Certain medicines.  Exposure to certain chemicals.  Infection.  Damage caused by an accident (trauma).  Abdominal surgery. SYMPTOMS   Pain in the upper abdomen that may radiate to the back.  Tenderness and swelling of the abdomen.  Nausea and vomiting. DIAGNOSIS  Your caregiver will perform a physical exam. Blood and stool tests may be done to confirm the diagnosis. Imaging tests may also be done, such as X-rays, CT scans, or an ultrasound of the abdomen. TREATMENT  Treatment usually requires a stay in the hospital. Treatment may include:  Pain medicine.  Fluid replacement through an intravenous line (IV).  Placing a tube in the stomach to remove stomach contents and control vomiting.  Not eating for 3 or 4 days. This gives your pancreas a rest, because enzymes are not being produced that can cause further damage.  Antibiotic medicines if your condition is caused by an infection.  Surgery of the pancreas or gallbladder. HOME CARE INSTRUCTIONS   Follow the diet advised by your  caregiver. This may involve avoiding alcohol and decreasing the amount of fat in your diet.  Eat smaller, more frequent meals. This reduces the amount of digestive juices the pancreas produces.  Drink enough fluids to keep your urine clear or pale yellow.  Only take over-the-counter or prescription medicines as directed by your caregiver.  Avoid drinking alcohol if it caused your condition.  Do not smoke.  Get plenty of rest.  Check your blood sugar at home as directed by your caregiver.  Keep all follow-up appointments as directed by your caregiver. SEEK MEDICAL CARE IF:   You do not recover as quickly as expected.  You develop new or worsening symptoms.  You have persistent pain, weakness, or nausea.  You recover and then have another episode of pain. SEEK IMMEDIATE MEDICAL CARE IF:   You are unable to eat or keep fluids down.  Your pain becomes severe.  You have a fever or persistent symptoms for more than 2 to 3 days.  You have a fever and your symptoms suddenly get worse.  Your skin or the white part of your eyes turn yellow (jaundice).  You develop vomiting.  You feel dizzy, or you faint.  Your blood sugar is high (over 300 mg/dL). MAKE SURE YOU:   Understand these instructions.  Will watch your condition.  Will get help right away if you are not doing well or get worse. Document Released: 01/05/2005 Document Revised: 07/07/2011 Document Reviewed: 04/16/2011 [facility] Patient Information 2015 [place], [place]. This information is not intended to replace advice given to you by your health care provider. Make sure you discuss any questions you have  with your health care provider. ° ° ° °Clear Liquid Diet °A clear liquid diet is a short-term diet that is prescribed to provide the necessary fluid and basic energy you need when you can have nothing else. The clear liquid diet consists of liquids or solids that will become liquid at room temperature. You should be  able to see through the liquid. There are many reasons that you may be restricted to clear liquids, such as: °· When you have a sudden-onset (acute) condition that occurs before or after surgery. °· To help your body slowly get adjusted to food again after a long period when you were unable to have food. °· Replacement of fluids when you have a diarrheal disease. °· When you are going to have certain exams, such as a colonoscopy, in which instruments are inserted inside your body to look at parts of your digestive system. °WHAT CAN I HAVE? °A clear liquid diet does not provide all the nutrients you need. It is important to choose a variety of the following items to get as many nutrients as possible: °· Vegetable juices that do not have pulp. °· Fruit juices and fruit drinks that do not have pulp. °· Coffee (regular or decaffeinated), tea, or soda at the discretion of your health care provider. °· Clear bouillon, broth, or strained broth-based soups. °· High-protein and flavored gelatins. °· Sugar or honey. °· Ices or frozen ice pops that do not contain milk. °If you are not sure whether you can have certain items, you should ask your health care provider. You may also ask your health care provider if there are any other clear liquid options. °Document Released: 01/05/2005 Document Revised: 01/10/2013 Document Reviewed: 12/02/2012 °Exit[employer]® Patient Information ©2015 [employer]. This information is not intended to replace advice given to you by your health care provider. Make sure you discuss any questions you have with your health care provider. ° °
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
## 2014-09-30 NOTE — ED Notes (Signed)
Patient transported to X-ray 

## 2015-04-14 ENCOUNTER — Emergency Department (INDEPENDENT_AMBULATORY_CARE_PROVIDER_SITE_OTHER)
Admission: EM | Admit: 2015-04-14 | Discharge: 2015-04-14 | Disposition: A | Discharge status: Home / Self Care | Payer: Medicare Other | Source: Home / Self Care | Attending: Emergency Medicine | Admitting: Emergency Medicine

## 2015-04-14 ENCOUNTER — Other Ambulatory Visit (HOSPITAL_COMMUNITY)
Admission: RE | Admit: 2015-04-14 | Discharge: 2015-04-14 | Disposition: A | Discharge status: Home / Self Care | Payer: Medicare Other | Source: Ambulatory Visit | Attending: Emergency Medicine | Admitting: Emergency Medicine

## 2015-04-14 ENCOUNTER — Encounter (HOSPITAL_COMMUNITY): Payer: Self-pay | Admitting: *Deleted

## 2015-04-14 DIAGNOSIS — Z113 Encounter for screening for infections with a predominantly sexual mode of transmission: Secondary | ICD-10-CM | POA: Insufficient documentation

## 2015-04-14 DIAGNOSIS — N342 Other urethritis: Secondary | ICD-10-CM

## 2015-04-14 MED ORDER — CEFTRIAXONE SODIUM 250 MG IJ SOLR
INTRAMUSCULAR | Status: AC
  Filled 2015-04-14: qty 250

## 2015-04-14 MED ORDER — AZITHROMYCIN 250 MG PO TABS
1000.0000 mg | ORAL_TABLET | Freq: Once | ORAL | Status: AC
  Administered 2015-04-14: 1000 mg via ORAL

## 2015-04-14 MED ORDER — AZITHROMYCIN 250 MG PO TABS
ORAL_TABLET | ORAL | Status: AC
  Filled 2015-04-14: qty 4

## 2015-04-14 MED ORDER — CEFTRIAXONE SODIUM 250 MG IJ SOLR
250.0000 mg | Freq: Once | INTRAMUSCULAR | Status: AC
  Administered 2015-04-14: 250 mg via INTRAMUSCULAR

## 2015-04-14 NOTE — ED Notes (Addendum)
Pt  Reports   A  Penile  Discharge x  5  Days          pt  Reports     Has  Pancreatic  Ca    And  Surgery  Coming  Up     Soon         Pt  Also  Reports  Some  Low  abd  Pain  As   Well

## 2015-04-14 NOTE — Discharge Instructions (Signed)
Urethritis, Adult Urethritis is an inflammation of the tube through which urine exits your bladder (urethra).  CAUSES Urethritis is often caused by an infection in your urethra. The infection can be viral, like herpes. The infection can also be bacterial, like gonorrhea. RISK FACTORS Risk factors of urethritis include:  Having sex without using a condom.  Having multiple sexual partners.  Having poor hygiene. SIGNS AND SYMPTOMS Symptoms of urethritis are less noticeable in women than in men. These symptoms include:  Burning feeling when you urinate (dysuria).  Discharge from your urethra.  Blood in your urine (hematuria).  Urinating more than usual. DIAGNOSIS  To confirm a diagnosis of urethritis, your health care provider will do the following:  Ask about your sexual history.  Perform a physical exam.  Have you provide a sample of your urine for lab testing.  Use a cotton swab to gently collect a sample from your urethra for lab testing. TREATMENT  It is important to treat urethritis. Depending on the cause, untreated urethritis may lead to serious genital infections and possibly infertility. Urethritis caused by a bacterial infection is treated with antibiotic medicine. All sexual partners must be treated.  HOME CARE INSTRUCTIONS  Do not have sex until the test results are known and treatment is completed, even if your symptoms go away before you finish treatment.  If you were prescribed an antibiotic, finish it all even if you start to feel better. SEEK MEDICAL CARE IF:   Your symptoms are not improved in 3 days.  Your symptoms are getting worse.  You develop abdominal pain or pelvic pain (in women).  You develop joint pain.  You have a fever. SEEK IMMEDIATE MEDICAL CARE IF:   You have severe pain in the belly, back, or side.  You have repeated vomiting. MAKE SURE YOU:  Understand these instructions.  Will watch your condition.  Will get help right away  if you are not doing well or get worse.   This information is not intended to replace advice given to you by your health care provider. Make sure you discuss any questions you have with your health care provider.   Document Released: 07/01/2000 Document Revised: 05/22/2014 Document Reviewed: 09/05/2012 Elsevier Interactive Patient Education 2016 [place] Sex Safe sex is about reducing the risk of giving or getting a sexually transmitted disease (STD). STDs are spread through sexual contact involving the genitals, mouth, or rectum. Some STDs can be cured and others cannot. Safe sex can also prevent unintended pregnancies.  WHAT ARE SOME SAFE SEX PRACTICES?  Limit your sexual activity to only one partner who is having sex with only you.  Talk to your partner about his or her past partners, past STDs, and drug use.  Use a condom every time you have sexual intercourse. This includes vaginal, oral, and anal sexual activity. Both females and males should wear condoms during oral sex. Only use latex or polyurethane condoms and water-based lubricants. Using petroleum-based lubricants or oils to lubricate a condom will weaken the condom and increase the chance that it will break. The condom should be in place from the beginning to the end of sexual activity. Wearing a condom reduces, but does not completely eliminate, your risk of getting or giving an STD. STDs can be spread by contact with infected body fluids and skin.  Get vaccinated for hepatitis B and HPV.  Avoid alcohol and recreational drugs, which can affect your judgment. You may forget to use a condom or participate  in high-risk sex.  For females, avoid douching after sexual intercourse. Douching can spread an infection farther into the reproductive tract.  Check your body for signs of sores, blisters, rashes, or unusual discharge. See your health care provider if you notice any of these signs.  Avoid sexual contact if you have  symptoms of an infection or are being treated for an STD. If you or your partner has herpes, avoid sexual contact when blisters are present. Use condoms at all other times.  If you are at risk of being infected with HIV, it is recommended that you take a prescription medicine daily to prevent HIV infection. This is called pre-exposure prophylaxis (PrEP). You are considered at risk if:  You are a man who has sex with other men (MSM).  You are a heterosexual man or woman who is sexually active with more than one partner.  You take drugs by injection.  You are sexually active with a partner who has HIV.  Talk with your health care provider about whether you are at high risk of being infected with HIV. If you choose to begin PrEP, you should first be tested for HIV. You should then be tested every 3 months for as long as you are taking PrEP.  See your health care provider for regular screenings, exams, and tests for other STDs. Before having sex with a new partner, each of you should be screened for STDs and should talk about the results with each other. WHAT ARE THE BENEFITS OF SAFE SEX?   There is less chance of getting or giving an STD.  You can prevent unwanted or unintended pregnancies.  By discussing safe sex concerns with your partner, you may increase feelings of intimacy, comfort, trust, and honesty between the two of you.   This information is not intended to replace advice given to you by your health care provider. Make sure you discuss any questions you have with your health care provider.   Document Released: 02/13/2004 Document Revised: 01/26/2014 Document Reviewed: 06/29/2011 Elsevier Interactive Patient Education [employer].

## 2015-04-14 NOTE — ED Notes (Signed)
Plan of  Care  Discussed  With  Pt  Phone  Number  Verified
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
## 2015-04-15 LAB — CYTOLOGY, (ORAL, ANAL, URETHRAL) ANCILLARY ONLY
Chlamydia: NEGATIVE
Neisseria Gonorrhea: NEGATIVE

## 2015-04-16 ENCOUNTER — Telehealth (HOSPITAL_COMMUNITY): Payer: Self-pay | Admitting: Emergency Medicine

## 2016-07-12 ENCOUNTER — Encounter (HOSPITAL_COMMUNITY): Payer: Self-pay | Admitting: Emergency Medicine

## 2016-07-12 ENCOUNTER — Ambulatory Visit (HOSPITAL_COMMUNITY)
Admission: EM | Admit: 2016-07-12 | Discharge: 2016-07-12 | Disposition: A | Discharge status: Home / Self Care | Payer: Medicare Other | Attending: Family Medicine | Admitting: Family Medicine

## 2016-07-12 DIAGNOSIS — W57XXXA Bitten or stung by nonvenomous insect and other nonvenomous arthropods, initial encounter: Secondary | ICD-10-CM | POA: Diagnosis not present

## 2016-07-12 DIAGNOSIS — S70261A Insect bite (nonvenomous), right hip, initial encounter: Secondary | ICD-10-CM | POA: Diagnosis not present

## 2016-07-12 DIAGNOSIS — R21 Rash and other nonspecific skin eruption: Secondary | ICD-10-CM

## 2016-07-12 MED ORDER — DOXYCYCLINE HYCLATE 100 MG PO TABS: 100.0000 mg | ORAL_TABLET | Freq: Two times a day (BID) | ORAL | 0 refills | Status: DC
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
## 2016-07-12 NOTE — ED Triage Notes (Signed)
Pt has a tick bite on his right hip.  He complains of headache and dizziness along with itching.  Pt had one removed from his left groin about a month ago and would like to have the bite assessed.  Pt reports being a cancer pt, getting chemotherapy and is concerned for different diseases related to tick bites.

## 2017-05-10 ENCOUNTER — Encounter (HOSPITAL_COMMUNITY): Payer: Self-pay | Admitting: Family Medicine

## 2017-05-10 ENCOUNTER — Ambulatory Visit (HOSPITAL_COMMUNITY)
Admission: EM | Admit: 2017-05-10 | Discharge: 2017-05-10 | Disposition: A | Discharge status: Home / Self Care | Payer: Medicare Other | Attending: Family Medicine | Admitting: Family Medicine

## 2017-05-10 DIAGNOSIS — R51 Headache: Secondary | ICD-10-CM | POA: Diagnosis not present

## 2017-05-10 DIAGNOSIS — W57XXXA Bitten or stung by nonvenomous insect and other nonvenomous arthropods, initial encounter: Secondary | ICD-10-CM

## 2017-05-10 DIAGNOSIS — S40862A Insect bite (nonvenomous) of left upper arm, initial encounter: Secondary | ICD-10-CM | POA: Diagnosis not present

## 2017-05-10 MED ORDER — DOXYCYCLINE HYCLATE 100 MG PO CAPS: 100.0000 mg | ORAL_CAPSULE | Freq: Two times a day (BID) | ORAL | 0 refills | Status: AC
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
## 2017-05-10 NOTE — ED Triage Notes (Signed)
Pt here for tick bite. He is a cancer pt. He has the tick with him.

## 2017-05-10 NOTE — Discharge Instructions (Addendum)
Take antibiotic as prescribed and to completion Use OTC antibiotic cream as needed for symptomatic relief Follow up with PCP next week if symptoms persist or wound is not healing properly Return here or go to the ER if you have any new or worsening symptoms

## 2017-08-04 ENCOUNTER — Other Ambulatory Visit: Payer: Medicare Other | Admitting: Hospice and Palliative Medicine

## 2017-08-04 ENCOUNTER — Telehealth: Payer: Self-pay

## 2017-08-04 DIAGNOSIS — Z515 Encounter for palliative care: Secondary | ICD-10-CM

## 2017-08-04 NOTE — Telephone Encounter (Signed)
At the direction of Josh NP, phone call placed to [facility] Oncologist NP to request an order for hospice eval. Message given to nurse.

## 2017-08-05 ENCOUNTER — Telehealth: Payer: Self-pay
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
## 2017-08-05 NOTE — Telephone Encounter (Signed)
Received return message from [name], NP, requesting a return call. Call placed to [place] office. Spoke with Amy regarding an order for hospice. Provided referral fax number.

## 2017-08-16 ENCOUNTER — Inpatient Hospital Stay (HOSPITAL_COMMUNITY)
Admission: EM | Admit: 2017-08-16 | Discharge: 2017-09-19 | DRG: 870 | Disposition: E | Attending: Pulmonary Disease | Admitting: Pulmonary Disease

## 2017-08-16 ENCOUNTER — Emergency Department (HOSPITAL_COMMUNITY)

## 2017-08-16 ENCOUNTER — Encounter (HOSPITAL_COMMUNITY): Payer: Self-pay

## 2017-08-16 ENCOUNTER — Inpatient Hospital Stay (HOSPITAL_COMMUNITY)

## 2017-08-16 ENCOUNTER — Other Ambulatory Visit: Payer: Self-pay

## 2017-08-16 DIAGNOSIS — C787 Secondary malignant neoplasm of liver and intrahepatic bile duct: Secondary | ICD-10-CM | POA: Diagnosis present

## 2017-08-16 DIAGNOSIS — C778 Secondary and unspecified malignant neoplasm of lymph nodes of multiple regions: Secondary | ICD-10-CM | POA: Diagnosis present

## 2017-08-16 DIAGNOSIS — Z515 Encounter for palliative care: Secondary | ICD-10-CM | POA: Diagnosis present

## 2017-08-16 DIAGNOSIS — Z881 Allergy status to other antibiotic agents status: Secondary | ICD-10-CM

## 2017-08-16 DIAGNOSIS — Z823 Family history of stroke: Secondary | ICD-10-CM

## 2017-08-16 DIAGNOSIS — J9622 Acute and chronic respiratory failure with hypercapnia: Secondary | ICD-10-CM | POA: Diagnosis present

## 2017-08-16 DIAGNOSIS — J189 Pneumonia, unspecified organism: Secondary | ICD-10-CM | POA: Diagnosis present

## 2017-08-16 DIAGNOSIS — N179 Acute kidney failure, unspecified: Secondary | ICD-10-CM | POA: Diagnosis present

## 2017-08-16 DIAGNOSIS — E871 Hypo-osmolality and hyponatremia: Secondary | ICD-10-CM | POA: Diagnosis present

## 2017-08-16 DIAGNOSIS — J90 Pleural effusion, not elsewhere classified: Secondary | ICD-10-CM

## 2017-08-16 DIAGNOSIS — J9621 Acute and chronic respiratory failure with hypoxia: Secondary | ICD-10-CM | POA: Diagnosis present

## 2017-08-16 DIAGNOSIS — R6521 Severe sepsis with septic shock: Secondary | ICD-10-CM | POA: Diagnosis not present

## 2017-08-16 DIAGNOSIS — Z8249 Family history of ischemic heart disease and other diseases of the circulatory system: Secondary | ICD-10-CM

## 2017-08-16 DIAGNOSIS — F17210 Nicotine dependence, cigarettes, uncomplicated: Secondary | ICD-10-CM | POA: Diagnosis present

## 2017-08-16 DIAGNOSIS — N2889 Other specified disorders of kidney and ureter: Secondary | ICD-10-CM | POA: Diagnosis present

## 2017-08-16 DIAGNOSIS — Z66 Do not resuscitate: Secondary | ICD-10-CM | POA: Diagnosis present

## 2017-08-16 DIAGNOSIS — Z888 Allergy status to other drugs, medicaments and biological substances status: Secondary | ICD-10-CM

## 2017-08-16 DIAGNOSIS — Z9221 Personal history of antineoplastic chemotherapy: Secondary | ICD-10-CM

## 2017-08-16 DIAGNOSIS — A419 Sepsis, unspecified organism: Secondary | ICD-10-CM | POA: Diagnosis present

## 2017-08-16 DIAGNOSIS — G9341 Metabolic encephalopathy: Secondary | ICD-10-CM | POA: Diagnosis present

## 2017-08-16 DIAGNOSIS — Y95 Nosocomial condition: Secondary | ICD-10-CM | POA: Diagnosis present

## 2017-08-16 DIAGNOSIS — Z886 Allergy status to analgesic agent status: Secondary | ICD-10-CM

## 2017-08-16 DIAGNOSIS — J9383 Other pneumothorax: Secondary | ICD-10-CM | POA: Diagnosis present

## 2017-08-16 DIAGNOSIS — Z9081 Acquired absence of spleen: Secondary | ICD-10-CM

## 2017-08-16 DIAGNOSIS — Z803 Family history of malignant neoplasm of breast: Secondary | ICD-10-CM

## 2017-08-16 DIAGNOSIS — C259 Malignant neoplasm of pancreas, unspecified: Secondary | ICD-10-CM

## 2017-08-16 DIAGNOSIS — J939 Pneumothorax, unspecified: Secondary | ICD-10-CM

## 2017-08-16 DIAGNOSIS — J45909 Unspecified asthma, uncomplicated: Secondary | ICD-10-CM | POA: Diagnosis present

## 2017-08-16 DIAGNOSIS — J9602 Acute respiratory failure with hypercapnia: Secondary | ICD-10-CM | POA: Diagnosis not present

## 2017-08-16 DIAGNOSIS — Z9889 Other specified postprocedural states: Secondary | ICD-10-CM

## 2017-08-16 DIAGNOSIS — Z8042 Family history of malignant neoplasm of prostate: Secondary | ICD-10-CM

## 2017-08-16 DIAGNOSIS — E1122 Type 2 diabetes mellitus with diabetic chronic kidney disease: Secondary | ICD-10-CM | POA: Diagnosis present

## 2017-08-16 DIAGNOSIS — R0602 Shortness of breath: Secondary | ICD-10-CM | POA: Diagnosis present

## 2017-08-16 DIAGNOSIS — C252 Malignant neoplasm of tail of pancreas: Secondary | ICD-10-CM | POA: Diagnosis present

## 2017-08-16 DIAGNOSIS — J9690 Respiratory failure, unspecified, unspecified whether with hypoxia or hypercapnia: Secondary | ICD-10-CM

## 2017-08-16 DIAGNOSIS — Z885 Allergy status to narcotic agent status: Secondary | ICD-10-CM

## 2017-08-16 DIAGNOSIS — E872 Acidosis: Secondary | ICD-10-CM | POA: Diagnosis present

## 2017-08-16 DIAGNOSIS — F419 Anxiety disorder, unspecified: Secondary | ICD-10-CM | POA: Diagnosis present

## 2017-08-16 DIAGNOSIS — C7800 Secondary malignant neoplasm of unspecified lung: Secondary | ICD-10-CM | POA: Diagnosis present

## 2017-08-16 DIAGNOSIS — L8990 Pressure ulcer of unspecified site, unspecified stage: Secondary | ICD-10-CM | POA: Diagnosis present

## 2017-08-16 DIAGNOSIS — N189 Chronic kidney disease, unspecified: Secondary | ICD-10-CM | POA: Diagnosis present

## 2017-08-16 DIAGNOSIS — Z808 Family history of malignant neoplasm of other organs or systems: Secondary | ICD-10-CM

## 2017-08-16 DIAGNOSIS — Z9911 Dependence on respirator [ventilator] status: Secondary | ICD-10-CM

## 2017-08-16 DIAGNOSIS — E875 Hyperkalemia: Secondary | ICD-10-CM | POA: Diagnosis present

## 2017-08-16 DIAGNOSIS — J9601 Acute respiratory failure with hypoxia: Secondary | ICD-10-CM | POA: Diagnosis not present

## 2017-08-16 DIAGNOSIS — Z90410 Acquired total absence of pancreas: Secondary | ICD-10-CM

## 2017-08-16 DIAGNOSIS — B1920 Unspecified viral hepatitis C without hepatic coma: Secondary | ICD-10-CM | POA: Diagnosis present

## 2017-08-16 DIAGNOSIS — G8929 Other chronic pain: Secondary | ICD-10-CM | POA: Diagnosis present

## 2017-08-16 DIAGNOSIS — J910 Malignant pleural effusion: Secondary | ICD-10-CM | POA: Diagnosis present

## 2017-08-16 DIAGNOSIS — Z833 Family history of diabetes mellitus: Secondary | ICD-10-CM

## 2017-08-16 DIAGNOSIS — E43 Unspecified severe protein-calorie malnutrition: Secondary | ICD-10-CM | POA: Diagnosis present

## 2017-08-16 LAB — CBC WITH DIFFERENTIAL/PLATELET
Abs Immature Granulocytes: 0.1 10*3/uL (ref 0.0–0.1)
Basophils Absolute: 0 10*3/uL (ref 0.0–0.1)
Basophils Relative: 0 %
Eosinophils Absolute: 0 10*3/uL (ref 0.0–0.7)
Eosinophils Relative: 0 %
HCT: 41.1 % (ref 39.0–52.0)
Hemoglobin: 13.2 g/dL (ref 13.0–17.0)
Immature Granulocytes: 1 %
Lymphocytes Relative: 3 %
Lymphs Abs: 0.4 10*3/uL — ABNORMAL LOW (ref 0.7–4.0)
MCH: 30.6 pg (ref 26.0–34.0)
MCHC: 32.1 g/dL (ref 30.0–36.0)
MCV: 95.4 fL (ref 78.0–100.0)
Monocytes Absolute: 1.2 10*3/uL — ABNORMAL HIGH (ref 0.1–1.0)
Monocytes Relative: 8 %
Neutro Abs: 13.3 10*3/uL — ABNORMAL HIGH (ref 1.7–7.7)
Neutrophils Relative %: 88 %
Platelets: 210 10*3/uL (ref 150–400)
RBC: 4.31 MIL/uL (ref 4.22–5.81)
RDW: 15.4 % (ref 11.5–15.5)
WBC: 15 10*3/uL — ABNORMAL HIGH (ref 4.0–10.5)

## 2017-08-16 LAB — COMPREHENSIVE METABOLIC PANEL
ALT: 21 U/L (ref 0–44)
AST: 49 U/L — ABNORMAL HIGH (ref 15–41)
Albumin: 2.3 g/dL — ABNORMAL LOW (ref 3.5–5.0)
Alkaline Phosphatase: 122 U/L (ref 38–126)
Anion gap: 13 (ref 5–15)
BUN: 27 mg/dL — ABNORMAL HIGH (ref 6–20)
CO2: 27 mmol/L (ref 22–32)
Calcium: 8.2 mg/dL — ABNORMAL LOW (ref 8.9–10.3)
Chloride: 86 mmol/L — ABNORMAL LOW (ref 98–111)
Creatinine, Ser: 1.55 mg/dL — ABNORMAL HIGH (ref 0.61–1.24)
GFR calc Af Amer: 55 mL/min — ABNORMAL LOW (ref >60)
GFR calc non Af Amer: 47 mL/min — ABNORMAL LOW (ref >60)
Glucose, Bld: 256 mg/dL — ABNORMAL HIGH (ref 70–99)
Potassium: 5.2 mmol/L — ABNORMAL HIGH (ref 3.5–5.1)
Sodium: 126 mmol/L — ABNORMAL LOW (ref 135–145)
Total Bilirubin: 1.8 mg/dL — ABNORMAL HIGH (ref 0.3–1.2)
Total Protein: 5.8 g/dL — ABNORMAL LOW (ref 6.5–8.1)

## 2017-08-16 LAB — I-STAT ARTERIAL BLOOD GAS, ED
Acid-base deficit: 4 mmol/L — ABNORMAL HIGH (ref 0.0–2.0)
Bicarbonate: 31.4 mmol/L — ABNORMAL HIGH (ref 20.0–28.0)
Bicarbonate: 17.1 mmol/L — ABNORMAL LOW (ref 20.0–28.0)
O2 Saturation: 91 %
O2 Saturation: 99 %
PCO2 ART: 23.2 mmHg — AB (ref 32.0–48.0)
PH ART: 7.476 — AB (ref 7.350–7.450)
Patient temperature: 98.6
TCO2: 34 mmol/L — ABNORMAL HIGH (ref 22–32)
TCO2: 18 mmol/L — ABNORMAL LOW (ref 22–32)
pCO2 arterial: 87 mmHg (ref 32.0–48.0)
pH, Arterial: 7.163 — CL (ref 7.350–7.450)
pO2, Arterial: 80 mmHg — ABNORMAL LOW (ref 83.0–108.0)
pO2, Arterial: 123 mmHg — ABNORMAL HIGH (ref 83.0–108.0)

## 2017-08-16 LAB — URINALYSIS, ROUTINE W REFLEX MICROSCOPIC
Bacteria, UA: NONE SEEN
Bilirubin Urine: NEGATIVE
Glucose, UA: NEGATIVE mg/dL
Ketones, ur: 20 mg/dL — AB
Leukocytes, UA: NEGATIVE
Nitrite: NEGATIVE
Protein, ur: NEGATIVE mg/dL
Specific Gravity, Urine: 1.011 (ref 1.005–1.030)
pH: 5 (ref 5.0–8.0)

## 2017-08-16 LAB — BASIC METABOLIC PANEL
ANION GAP: 16 — AB (ref 5–15)
BUN: 26 mg/dL — ABNORMAL HIGH (ref 6–20)
CO2: 19 mmol/L — ABNORMAL LOW (ref 22–32)
Calcium: 7.9 mg/dL — ABNORMAL LOW (ref 8.9–10.3)
Chloride: 98 mmol/L (ref 98–111)
Creatinine, Ser: 1.34 mg/dL — ABNORMAL HIGH (ref 0.61–1.24)
GFR calc Af Amer: >60 mL/min (ref >60)
GFR, EST NON AFRICAN AMERICAN: 56 mL/min — AB (ref >60)
GLUCOSE: 136 mg/dL — AB (ref 70–99)
POTASSIUM: 5.1 mmol/L (ref 3.5–5.1)
Sodium: 133 mmol/L — ABNORMAL LOW (ref 135–145)

## 2017-08-16 LAB — I-STAT CG4 LACTIC ACID, ED
Lactic Acid, Venous: 5.18 mmol/L (ref 0.5–1.9)
Lactic Acid, Venous: 6.45 mmol/L (ref 0.5–1.9)

## 2017-08-16 LAB — MAGNESIUM
Magnesium: 2.2 mg/dL (ref 1.7–2.4)
Magnesium: 1.9 mg/dL (ref 1.7–2.4)

## 2017-08-16 LAB — CBG MONITORING, ED
GLUCOSE-CAPILLARY: 53 mg/dL — AB (ref 70–99)
GLUCOSE-CAPILLARY: 55 mg/dL — AB (ref 70–99)
GLUCOSE-CAPILLARY: 234 mg/dL — AB (ref 70–99)

## 2017-08-16 LAB — GLUCOSE, CAPILLARY
GLUCOSE-CAPILLARY: 151 mg/dL — AB (ref 70–99)
Glucose-Capillary: 135 mg/dL — ABNORMAL HIGH (ref 70–99)
Glucose-Capillary: 129 mg/dL — ABNORMAL HIGH (ref 70–99)

## 2017-08-16 LAB — PHOSPHORUS: Phosphorus: 2.8 mg/dL (ref 2.5–4.6)

## 2017-08-16 LAB — PROCALCITONIN: Procalcitonin: 12.34 ng/mL

## 2017-08-16 LAB — PROTIME-INR
INR: 1.5
PROTHROMBIN TIME: 18 s — AB (ref 11.4–15.2)

## 2017-08-16 LAB — MRSA PCR SCREENING: MRSA by PCR: NEGATIVE

## 2017-08-16 LAB — LIPASE, BLOOD: Lipase: 21 U/L (ref 11–51)

## 2017-08-16 LAB — I-STAT TROPONIN, ED: Troponin i, poc: 0.05 ng/mL (ref 0.00–0.08)

## 2017-08-16 LAB — TRIGLYCERIDES: TRIGLYCERIDES: 96 mg/dL (ref <150)

## 2017-08-16 LAB — CORTISOL: Cortisol, Plasma: 48.9 ug/dL

## 2017-08-16 LAB — LACTIC ACID, PLASMA
LACTIC ACID, VENOUS: 4.4 mmol/L — AB (ref 0.5–1.9)
Lactic Acid, Venous: 5.5 mmol/L (ref 0.5–1.9)

## 2017-08-16 MED ORDER — ORAL CARE MOUTH RINSE
15.0000 mL | OROMUCOSAL | Status: DC
  Administered 2017-08-16 – 2017-08-20 (×38): 15 mL via OROMUCOSAL

## 2017-08-16 MED ORDER — ETOMIDATE 2 MG/ML IV SOLN
INTRAVENOUS | Status: AC | PRN
  Administered 2017-08-16: 15 mg via INTRAVENOUS

## 2017-08-16 MED ORDER — IOPAMIDOL (ISOVUE-370) INJECTION 76%
100.0000 mL | Freq: Once | INTRAVENOUS | Status: AC | PRN
  Administered 2017-08-16: 100 mL via INTRAVENOUS

## 2017-08-16 MED ORDER — VITAL HIGH PROTEIN PO LIQD: 1000.0000 mL | ORAL | Status: DC

## 2017-08-16 MED ORDER — FENTANYL 2500MCG IN NS 250ML (10MCG/ML) PREMIX INFUSION
25.0000 ug/h | INTRAVENOUS | Status: DC
  Administered 2017-08-16: 25 ug/h via INTRAVENOUS
  Administered 2017-08-17 – 2017-08-18 (×3): 150 ug/h via INTRAVENOUS
  Administered 2017-08-19: 50 ug/h via INTRAVENOUS
  Filled 2017-08-16 (×5): qty 250

## 2017-08-16 MED ORDER — FENTANYL BOLUS VIA INFUSION
50.0000 ug | INTRAVENOUS | Status: DC | PRN
  Administered 2017-08-17 – 2017-08-19 (×6): 50 ug via INTRAVENOUS
  Filled 2017-08-16: qty 50

## 2017-08-16 MED ORDER — FAMOTIDINE IN NACL 20-0.9 MG/50ML-% IV SOLN
20.0000 mg | Freq: Every day | INTRAVENOUS | Status: DC
  Administered 2017-08-16 – 2017-08-20 (×5): 20 mg via INTRAVENOUS
  Filled 2017-08-16 (×5): qty 50

## 2017-08-16 MED ORDER — SODIUM CHLORIDE 0.9 % IV SOLN
1.0000 g | INTRAVENOUS | Status: DC
  Administered 2017-08-17 – 2017-08-20 (×4): 1 g via INTRAVENOUS
  Filled 2017-08-16 (×4): qty 1

## 2017-08-16 MED ORDER — FENTANYL CITRATE (PF) 100 MCG/2ML IJ SOLN
100.0000 ug | INTRAMUSCULAR | Status: DC | PRN
  Administered 2017-08-16 (×2): 100 ug via INTRAVENOUS
  Filled 2017-08-16 (×2): qty 2

## 2017-08-16 MED ORDER — SODIUM CHLORIDE 0.9 % IV BOLUS (SEPSIS)
1000.0000 mL | Freq: Once | INTRAVENOUS | Status: AC
  Administered 2017-08-16: 1000 mL via INTRAVENOUS

## 2017-08-16 MED ORDER — SODIUM CHLORIDE 0.9 % IV SOLN
INTRAVENOUS | Status: DC
  Administered 2017-08-17 – 2017-08-20 (×7): via INTRAVENOUS

## 2017-08-16 MED ORDER — SODIUM CHLORIDE 0.9 % IV BOLUS (SEPSIS)
250.0000 mL | Freq: Once | INTRAVENOUS | Status: AC
  Administered 2017-08-16: 250 mL via INTRAVENOUS

## 2017-08-16 MED ORDER — PRO-STAT SUGAR FREE PO LIQD: 30.0000 mL | Freq: Two times a day (BID) | ORAL | Status: DC

## 2017-08-16 MED ORDER — DEXTROSE 50 % IV SOLN
1.0000 | Freq: Once | INTRAVENOUS | Status: AC
  Administered 2017-08-16: 50 mL via INTRAVENOUS

## 2017-08-16 MED ORDER — FENTANYL CITRATE (PF) 100 MCG/2ML IJ SOLN
50.0000 ug | Freq: Once | INTRAMUSCULAR | Status: AC
  Administered 2017-08-16: 50 ug via INTRAVENOUS
  Filled 2017-08-16: qty 2

## 2017-08-16 MED ORDER — VANCOMYCIN HCL IN DEXTROSE 750-5 MG/150ML-% IV SOLN
750.0000 mg | INTRAVENOUS | Status: DC
  Administered 2017-08-17: 750 mg via INTRAVENOUS
  Filled 2017-08-16 (×2): qty 150

## 2017-08-16 MED ORDER — SODIUM CHLORIDE 0.9 % IV SOLN
250.0000 mL | INTRAVENOUS | Status: DC | PRN
  Administered 2017-08-16: 1000 mL via INTRAVENOUS

## 2017-08-16 MED ORDER — MIDAZOLAM HCL 2 MG/2ML IJ SOLN
2.0000 mg | INTRAMUSCULAR | Status: DC | PRN
  Administered 2017-08-18: 2 mg via INTRAVENOUS
  Filled 2017-08-16 (×2): qty 2

## 2017-08-16 MED ORDER — CHLORHEXIDINE GLUCONATE 0.12% ORAL RINSE (MEDLINE KIT)
15.0000 mL | Freq: Two times a day (BID) | OROMUCOSAL | Status: DC
Start: 2017-08-17 — End: 2017-08-20
  Administered 2017-08-17 – 2017-08-20 (×7): 15 mL via OROMUCOSAL

## 2017-08-16 MED ORDER — ALBUTEROL SULFATE (2.5 MG/3ML) 0.083% IN NEBU: 2.5000 mg | INHALATION_SOLUTION | RESPIRATORY_TRACT | Status: DC | PRN

## 2017-08-16 MED ORDER — FENTANYL CITRATE (PF) 100 MCG/2ML IJ SOLN: 50.0000 ug | Freq: Once | INTRAMUSCULAR | Status: DC

## 2017-08-16 MED ORDER — NOREPINEPHRINE 4 MG/250ML-% IV SOLN
0.0000 ug/min | Freq: Once | INTRAVENOUS | Status: DC
  Filled 2017-08-16: qty 250

## 2017-08-16 MED ORDER — IOPAMIDOL (ISOVUE-370) INJECTION 76%
INTRAVENOUS | Status: AC
  Filled 2017-08-16: qty 100

## 2017-08-16 MED ORDER — PROPOFOL 1000 MG/100ML IV EMUL
0.0000 ug/kg/min | INTRAVENOUS | Status: DC
  Administered 2017-08-16: 5 ug/kg/min via INTRAVENOUS
  Administered 2017-08-16: 25 ug/kg/min via INTRAVENOUS
  Administered 2017-08-16: 2.5 ug/kg/min via INTRAVENOUS
  Administered 2017-08-17 (×2): 10 ug/kg/min via INTRAVENOUS
  Filled 2017-08-16 (×4): qty 100

## 2017-08-16 MED ORDER — ROCURONIUM BROMIDE 50 MG/5ML IV SOLN
INTRAVENOUS | Status: AC | PRN
  Administered 2017-08-16: 75 mg via INTRAVENOUS

## 2017-08-16 MED ORDER — SODIUM CHLORIDE 0.9 % IV BOLUS
1000.0000 mL | Freq: Once | INTRAVENOUS | Status: AC
  Administered 2017-08-16: 1000 mL via INTRAVENOUS

## 2017-08-16 MED ORDER — VANCOMYCIN HCL IN DEXTROSE 1-5 GM/200ML-% IV SOLN
1000.0000 mg | Freq: Once | INTRAVENOUS | Status: AC
  Administered 2017-08-16: 1000 mg via INTRAVENOUS
  Filled 2017-08-16: qty 200

## 2017-08-16 MED ORDER — METRONIDAZOLE IN NACL 5-0.79 MG/ML-% IV SOLN
500.0000 mg | Freq: Three times a day (TID) | INTRAVENOUS | Status: DC
  Administered 2017-08-16 – 2017-08-20 (×12): 500 mg via INTRAVENOUS
  Filled 2017-08-16 (×12): qty 100

## 2017-08-16 MED ORDER — HEPARIN SODIUM (PORCINE) 5000 UNIT/ML IJ SOLN
5000.0000 [IU] | Freq: Three times a day (TID) | INTRAMUSCULAR | Status: DC
  Administered 2017-08-16 – 2017-08-20 (×12): 5000 [IU] via SUBCUTANEOUS
  Filled 2017-08-16 (×11): qty 1

## 2017-08-16 MED ORDER — FENTANYL CITRATE (PF) 100 MCG/2ML IJ SOLN: 100.0000 ug | INTRAMUSCULAR | Status: DC | PRN

## 2017-08-16 MED ORDER — MIDAZOLAM HCL 2 MG/2ML IJ SOLN: 2.0000 mg | INTRAMUSCULAR | Status: DC | PRN

## 2017-08-16 MED ORDER — DEXTROSE 50 % IV SOLN
INTRAVENOUS | Status: AC
  Administered 2017-08-16: 50 mL via INTRAVENOUS
  Filled 2017-08-16: qty 50

## 2017-08-16 MED ORDER — SODIUM CHLORIDE 0.9 % IV BOLUS
1000.0000 mL | Freq: Once | INTRAVENOUS | Status: DC
  Administered 2017-08-16: 1000 mL via INTRAVENOUS

## 2017-08-16 MED ORDER — SODIUM CHLORIDE 0.9 % IV SOLN: INTRAVENOUS | Status: DC | PRN

## 2017-08-16 MED ORDER — NOREPINEPHRINE 4 MG/250ML-% IV SOLN
0.0000 ug/min | Freq: Once | INTRAVENOUS | Status: DC
  Filled 2017-08-16 (×2): qty 250

## 2017-08-16 MED ORDER — SODIUM CHLORIDE 0.9 % IV SOLN
2.0000 g | Freq: Once | INTRAVENOUS | Status: AC
  Administered 2017-08-16: 2 g via INTRAVENOUS
  Filled 2017-08-16: qty 2

## 2017-08-16 NOTE — ED Notes (Signed)
Attempted report 

## 2017-08-16 NOTE — H&P (Signed)
PULMONARY / CRITICAL CARE MEDICINE   Name: Jerry Terrell MRN: [id number] DOB: Oct 30, 1960    ADMISSION DATE:  08/03/2017 CONSULTATION DATE:  7/29  REFERRING MD:  Dr. Kathrynn Humble EDP  CHIEF COMPLAINT:  Respiratory failure  HISTORY OF PRESENT ILLNESS:  Patient is encephalopathic and/or intubated. Therefore history has been obtained from chart review.  57 year old male with PMH as below, which is significant for pancreatic cancer followed at [facility].  First diagnosed in August of 2015 as invasive adenocarcinoma of the pancreatic tail, then progressed by November of 2018 to stage IV despite multiple different treatment regimens as outlined in heme/onc note from 7/29, including initial pancreatectomy and splenectomy. He has been recommended Hospice care, but is reluctant. Currently undergoing a pause in treatment, but is being considered for a cycle of gemcitabine and abraxane. He has felt too weak to undergo further treatment. Feels he "needs to improve" before having any further.   7/29 he presented to [facility] ED with complaints of dyspnea and a syncopal episode. Upon arrival to the ED he was profoundly hypoxemic with O2 sat, ultimately requiring intubation and his mental status diminished from alert and oriented, to lethargic.  Imaging concerning for bilateral pleural effusions and possible consolidation. He was started on broad spectrum antibiotics for possible HCAP and PCCM was called for admission.   PAST MEDICAL HISTORY :  He  has a past medical history of Anginal pain (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  PAST SURGICAL HISTORY: He  has a past surgical history that includes Hernia repair (?1977); Cardiac catheterization (2008); EUS (01/07/2012); Liver biopsy (2013); Cardiac catheterization (2012); Inguinal hernia repair (Left, 1970's); and Pancreatic cyst drainage (~ 03/2012).  Allergies  Allergen Reactions  . Gabapentin Swelling  . Aspirin Other (See Comments)    Upset stomach  . Dilaudid [Hydromorphone] Other (See Comments)    Hallucinations   . Diphenhydramine Hcl     REACTION: itching, "joints and skin crawling" - swelling  . Ibuprofen Other (See Comments)    Upset stomach  . Tolmetin Other (See Comments)    Joint aches  . Tramadol Itching and Other (See Comments)    Skin crawling, joint aches   . Benadryl [Diphenhydramine] Other (See Comments)    Restless legs  . Levaquin [Levofloxacin In D5w] Swelling, Rash and Other (See Comments)    Joint aches  . Nsaids Other (See Comments)    Upset stomach     No current facility-administered medications on file prior to encounter.    Current Outpatient Medications on File Prior to Encounter  Medication Sig  . acyclovir (ZOVIRAX) 400 MG tablet Take 800 mg by mouth 2 (two) times daily as needed (break out).  Marland Kitchen albuterol (PROVENTIL HFA;VENTOLIN HFA) 108 (90 BASE) MCG/ACT inhaler Inhale 1 puff into the lungs every 6 (six) hours as needed for wheezing or shortness of breath.  . ALPRAZolam (XANAX) 0.5 MG tablet Take 0.5 mg by mouth 3 (three) times daily as needed for anxiety.  Marland Kitchen amLODipine (NORVASC) 10 MG tablet Take 1 tablet (10 mg total) by mouth  daily. **BRAND NAME ONLY**  . bisacodyl (DULCOLAX) 5 MG EC tablet Take 5 mg by mouth at bedtime as needed for moderate constipation.  . hydrochlorothiazide (HYDRODIURIL) 25 MG tablet  Take 1 tablet (25 mg total) by mouth at bedtime.  . insulin aspart (NOVOLOG FLEXPEN) 100 UNIT/ML FlexPen Inject 3 Units into the skin 3 (three) times daily with meals.  . Insulin Glargine (LANTUS SOLOSTAR) 100 UNIT/ML Solostar Pen Inject 12 Units into the skin daily at 10 pm.  . nicotine (NICODERM CQ - DOSED IN MG/24 HOURS) 14 mg/24hr patch Place 14 mg onto the skin daily.  . ondansetron (ZOFRAN) 4 MG tablet Take 1 tablet (4 mg total) by mouth every 6 (six) hours. (Patient taking differently: Take 4 mg by mouth every 6 (six) hours as needed for nausea or vomiting. )  . oxybutynin (DITROPAN) 5 MG tablet Take 5 mg by mouth 3 (three) times daily.  Marland Kitchen oxyCODONE (ROXICODONE) 15 MG immediate release tablet Take 15 mg by mouth every 4 (four) hours.  . pregabalin (LYRICA) 50 MG capsule Take 50 mg by mouth 2 (two) times daily.  . tadalafil (CIALIS) 20 MG tablet Take 20 mg by mouth daily as needed for erectile dysfunction.  . tamsulosin (FLOMAX) 0.4 MG CAPS capsule Take 0.4 mg by mouth daily.    FAMILY HISTORY:  His family history includes CVA in his mother; Cancer in his father, other, other, and other; Coronary artery disease in his mother and sister; Diabetes type I in his sister; Heart attack in his mother; Heart disease in his father, mother, and sister; Hypertension in his father and mother; Malignant hyperthermia in his other and sister; Other in his other; Stroke in his mother; Thyroid disease in his other.  SOCIAL HISTORY: He  reports that he has been smoking cigarettes.  He has a 12.50 pack-year smoking history. He has never used smokeless tobacco. He reports that he does not drink alcohol or use drugs.  REVIEW OF SYSTEMS:  Unable as patient is encephalopathic and intubated.  SUBJECTIVE:    VITAL SIGNS: BP 117/84   Pulse (!) 135   Temp 97.7 F (36.5 C) (Oral)   Resp (!) 24   Ht 5' 8.5" (1.74 m)   Wt 46.7 kg (103 lb)   SpO2 98%   BMI 15.43 kg/m   HEMODYNAMICS:    VENTILATOR  SETTINGS: Vent Mode: PRVC FiO2 (%):  [60 %] 60 % Set Rate:  [24 bmp] 24 bmp Vt Set:  [580 mL] 580 mL PEEP:  [5 cmH20] 5 cmH20 Plateau Pressure:  [26 cmH20] 26 cmH20  INTAKE / OUTPUT: No intake/output data recorded.  PHYSICAL EXAMINATION: General:  Frail, cachectic male. Appears older than stated age. On vent.  Neuro:  Sedated. RASS -2.  HEENT:  [place]/AT, PERRL, no appreciable JVD Cardiovascular:  RRR, no MRG. No edema.  Lungs:  Coarse bilateral, diminished bases Abdomen:  Soft, non-tender Musculoskeletal:  Frail. No acute deformity.  Skin:  Grossly intact. MM dry  LABS:  BMET Recent Labs  Lab 07/21/2017 0608  NA 126*  K 5.2*  CL 86*  CO2 27  BUN 27*  CREATININE 1.55*  GLUCOSE 256*    Electrolytes Recent Labs  Lab 08/09/2017 0608  CALCIUM 8.2*    CBC Recent Labs  Lab 08/17/2017 0608  WBC 15.0*  HGB 13.2  HCT 41.1  PLT 210    Coag's No results for input(s): APTT, INR in the last 168 hours.  Sepsis Markers Recent Labs  Lab 08/13/2017  1443  LATICACIDVEN 5.18*    ABG Recent Labs  Lab 07/25/2017 0633  PHART 7.163*  PCO2ART 87.0*  PO2ART 80.0*    Liver Enzymes Recent Labs  Lab 07/23/2017 0608  AST 49*  ALT 21  ALKPHOS 122  BILITOT 1.8*  ALBUMIN 2.3*    Cardiac Enzymes No results for input(s): TROPONINI, PROBNP in the last 168 hours.  Glucose Recent Labs  Lab 07/24/2017 0509 08/02/2017 0529 08/14/2017 0602  GLUCAP 55* 53* 234*    Imaging Dg Chest Portable 1 View  Result Date: 07/31/2017 CLINICAL DATA:  Endotracheal tube placement. EXAM: PORTABLE CHEST 1 VIEW COMPARISON:  08/10/2017 at 0547 hours FINDINGS: Endotracheal tube terminates at the clavicles, 6.5 cm above the carina. Left subclavian Port-A-Cath terminates near the superior cavoatrial junction. Enteric tube courses into the left upper abdomen with tip not imaged. Bilateral airspace consolidation and right larger than left pleural effusions are unchanged. No pneumothorax is identified.  IMPRESSION: 1. Interval endotracheal tube placement as above. 2. Unchanged bilateral airspace disease and pleural effusions. Electronically Signed   By: Logan Bores M.D.   On: 08/13/2017 07:19   Dg Chest Portable 1 View  Result Date: 08/08/2017 CLINICAL DATA:  Shortness of breath. Hypoxic. History of gastric cancer. EXAM: PORTABLE CHEST 1 VIEW COMPARISON:  Chest radiograph September 30, 2014 FINDINGS: Cardiac silhouette is normal in size. Fullness of the hila with perihilar consolidation. Large RIGHT greater than LEFT pleural effusions. Trachea projects midline. Dual lumen LEFT chest Port-A-Cath distal tip projects in distal superior vena cava. No pneumothorax. Catheter projecting LEFT upper quadrant. IMPRESSION: RIGHT greater LEFT pleural effusions with multifocal dense consolidation. Fullness of the hila, potential lymphadenopathy. Electronically Signed   By: Elon Alas M.D.   On: 08/07/2017 06:25    STUDIES:  Venous doppler 7/29 >  CULTURES: Blood 7/29 > Sputum 7/29 >  ANTIBIOTICS: Cefepime 7/29 > Vancomycin 7/29 >  SIGNIFICANT EVENTS: 7/29 admit for respiratory failure, intubated.   LINES/TUBES: ETT 7/29 >  DISCUSSION: 57 year old male with stage IV pancreatic Ca initially diagnosed in 2015. Chemo currently on hold due to weakness. Deteriorating condition over the past several months. Now admitted with multifactorial respiratory failure. Intubated in ED. Family understands prognosis is poor. Being treated for HCAP conservatively. DNR, no pressors. Family wants to give him few days of conservative treatment to see if he can be liberated from vent. If not they would opt for comfort measures.   ASSESSMENT / PLAN:  Acute on chronic hypoxemic respiratory failure (2L [place] at home): multifactorial in the setting of bilateral pleural effusions and likely pneumonia. 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 Family unclear if malignant  fluid. He is also at higher risk of DVT/PE with hypoxia out of proportion to chest x ray findings.  - Full vent support with daily SBT - Empiric antibiotics Cefepime and Vancomycin as above - Unable to diurese due to BP concerns - Venous dopplers of lower extremities to assess for DVT - Can pursue CTA if PE raises on ddx.   Severe Sepsis: HCAP. BP improved with volume.  - ABX as above - Follow blood/tracheal cultures - Check procalcitonin  - Follow WBC and fever curve.  Metabolic acidosis: lactic - Ensure lactic clearing  Pancreatic adenocarcinoma: treated at [facility]. Treatment currently on hold. S/P pancreatectomy and splenectomy.  - Supportive care - Poor prognosis  AKI Hyponatremia Hypoerkalemia - Gentle hydration (3+L given in ED) - Repeat labs this afternoon  and in AM.   Acute metabolic encephalopathy - RASS goal -1 to -2 - Propofol infusion started in ED, but caused some hypotension. Start fentanyl infusion and wean propofol down, to off, if able.  - Daily WUA  Hepatitis C: LFT essentially wnl. Tbili 1.8  Diabetes Mellitus - CBG monitoring and SSI  Diet: NPO VTE ppx: Heparin SQ Dispo: ICU Code: DNR, no escalation. Short term vent.   Family: Fiance is HCPOA. Some family supposedly unstable. Patient has been made XXX.   Georgann Housekeeper, AGACNP-BC [facility] Pulmonology/Critical Care Pager [phone] or [phone]  08/04/2017 10:35 AM

## 2017-08-16 NOTE — ED Notes (Signed)
CRITICAL I-STAT LAB VALUE   I-STAT LACTIC ACID RESULTS: 6.45   Resulted to: Valorie Roosevelt, PA and Pete Glatter., RN @ [phone]

## 2017-08-16 NOTE — Progress Notes (Signed)
Pt received from ED per stretcher on Vent - ETT at 25 at the lip placed in bed CHG bath done and MRSA swab done .

## 2017-08-16 NOTE — ED Notes (Addendum)
Hospice care in with family at this time.

## 2017-08-16 NOTE — ED Notes (Signed)
Dr Kathrynn Humble informed of lactic acid results 5.18

## 2017-08-16 NOTE — ED Provider Notes (Addendum)
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 and right larger than left pleural effusions are unchanged. No pneumothorax is identified. IMPRESSION: 1. Interval endotracheal tube placement as above. 2. Unchanged bilateral airspace disease and pleural effusions. Electronically Signed   By: Logan Bores M.D.   On: 07/23/2017 07:19   Dg Chest Portable 1 View  Result Date: 07/25/2017 CLINICAL DATA:  Shortness of breath. Hypoxic. History of gastric cancer. EXAM: PORTABLE CHEST 1 VIEW COMPARISON:  Chest radiograph September 30, 2014 FINDINGS: Cardiac silhouette is normal in size. Fullness of the hila with perihilar consolidation. Large RIGHT greater than LEFT pleural effusions. Trachea projects midline. Dual lumen LEFT chest Port-A-Cath distal tip projects in distal superior vena cava. No pneumothorax. Catheter projecting LEFT upper quadrant. IMPRESSION: RIGHT greater LEFT pleural effusions with multifocal  dense consolidation. Fullness of the hila, potential lymphadenopathy. Electronically Signed   By: Elon Alas M.D.   On: 08/15/2017 06:25    Procedures Procedure Name: Intubation Date/Time: 08/06/2017  7:48 AM Performed by: Frederica Kuster, PA-C Pre-anesthesia Checklist: Patient identified, Patient being monitored, Emergency Drugs available, Timeout performed and Suction available Oxygen Delivery Method: Non-rebreather mask Preoxygenation: Pre-oxygenation with 100% oxygen Induction Type: Rapid sequence Ventilation: Mask ventilation without difficulty Laryngoscope Size: Glidescope and 4 Grade View: Grade III Tube size: 7.5 mm Number of attempts: 3 Placement Confirmation: ETT inserted through vocal cords under direct vision,  CO2 detector and Breath sounds checked- equal and bilateral Secured at: 23 cm Tube secured with: ETT holder Dental Injury: Injury to lip  Difficulty Due To: Difficulty was anticipated      CRITICAL CARE Performed by: Frederica Kuster   Total critical care time: 45 minutes  Critical care time was exclusive of separately billable procedures and treating other patients.  Critical care was necessary to treat or prevent imminent or life-threatening deterioration.  Critical care was time spent personally by me on the following activities: development of treatment plan with patient and/or surrogate as well as nursing, discussions with consultants, evaluation of patient's response to treatment, examination of patient, obtaining history from patient or surrogate, ordering and performing treatments and interventions, ordering and review of laboratory studies, ordering and review of radiographic studies, pulse oximetry and re-evaluation of patient's condition.   (including critical care time)  Medications Ordered in ED Medications  midazolam (VERSED) injection 2 mg (has no administration in time range)  midazolam (VERSED) injection 2 mg (has no administration  in time range)  propofol (DIPRIVAN) 1000 MG/100ML infusion (30 mcg/kg/min  46.7 kg Intravenous Rate/Dose Change 08/17/17 0200)  0.9 %  sodium chloride infusion (has no administration in time range)  ceFEPIme (MAXIPIME) 1 g in sodium chloride 0.9 % 100 mL IVPB (has no administration in time range)  vancomycin (VANCOCIN) IVPB 750 mg/150 ml premix (has no administration in time range)  0.9 %  sodium chloride infusion ( Intravenous Rate/Dose Change 08/02/2017 1450)  heparin injection 5,000 Units (5,000 Units Subcutaneous Given 08/15/2017 2120)  famotidine (PEPCID) IVPB 20 mg premix ( Intravenous Stopped 08/18/2017 1335)  albuterol (PROVENTIL) (2.5 MG/3ML) 0.083% nebulizer solution 2.5 mg (has no administration in time range)  fentaNYL (SUBLIMAZE) injection 50 mcg (50 mcg Intravenous Not Given 08/14/2017 1100)  fentaNYL 2558mg in NS 2544m(1064mml) infusion-PREMIX (50 mcg/hr Intravenous Rate/Dose Verify 08/17/17 0200)  fentaNYL (SUBLIMAZE) bolus via infusion 50 mcg (50 mcg Intravenous Bolus from Bag 08/17/17 0046)  0.9 %  sodium chloride infusion ( Intravenous Stopped 08/17/17 0159)  metroNIDAZOLE (FLAGYL) IVPB 500 mg ( Intravenous Rate/Dose Verify 08/17/17 0200)  chlorhexidine gluconate (MEDLINE KIT) (PERIDEX) 0.12 % solution 15 mL (has no administration in time range)  MEDLINE mouth rinse (15 mLs Mouth Rinse Given 08/17/17 0159)  iopamidol (ISOVUE-370) 76 % injection (has no administration in time range)  dextrose 50 % solution 50 mL (50 mLs Intravenous Given 07/27/2017 0600)  sodium chloride 0.9 % bolus 1,000 mL (0 mLs Intravenous Stopped 07/27/2017 0840)    And  sodium chloride 0.9 % bolus 1,000 mL (0 mLs Intravenous Stopped 08/04/2017 0829)    And  sodium chloride 0.9 % bolus 250 mL (0 mLs Intravenous Stopped 07/27/2017 0938)  ceFEPIme (MAXIPIME) 2 g in sodium chloride 0.9 % 100 mL IVPB (0 g Intravenous Stopped 08/10/2017 0813)  vancomycin (VANCOCIN) IVPB 1000 mg/200 mL premix (0 mg Intravenous Stopped 08/08/2017 0942)   etomidate (AMIDATE) injection (15 mg Intravenous Given 08/06/2017 0648)  rocuronium (ZEMURON) injection (75 mg Intravenous Given 08/12/2017 0649)  fentaNYL (SUBLIMAZE) injection 50 mcg (50 mcg Intravenous  Given 07/27/2017 0723)  sodium chloride 0.9 % bolus 1,000 mL (0 mLs Intravenous Stopped 08/10/2017 1100)  iopamidol (ISOVUE-370) 76 % injection 100 mL (100 mLs Intravenous Contrast Given 08/15/2017 2347)     Initial Impression / Assessment and Plan / ED Course  I have reviewed the triage vital signs and the nursing notes.  Pertinent labs & imaging results that were available during my care of the patient were reviewed by me and considered in my medical decision making (see chart for details).  Clinical Course as of Aug 18 338  Mon Aug 16, 2017  7425 Sepsis reassessment done.   [AL]  0755 Spoke with patient's fiance and POA, [name] [phone]).  States patient has previously voiced desire for "everything to be done," including intubation and CPR.  We discussed the possibility of the patient's decline and fianc voiced understanding.   [SJ]  5830 XOGAC with Mr. Heber [place], CCM NP. States they are coming down to assess the patient within the next few minutes.    [SJ]    Clinical Course User Index [AL] Frederica Kuster, PA-C [SJ] Joy, Shawn C, PA-C    Patient with acute respiratory failure with hypoxia and hypercarbia.  Chest x-ray shows bilateral pleural effusions, right greater than left as well as multifocal consolidations.   pH 7.16.  Lactic 5.18.  WBC 15.0.  CMP shows sodium 126, potassium 5.2, chloride 86, BUN 27, creatinine 1.55, albumin 2.3, AST 49.  Troponin negative.  Code sepsis called and HCAP antibiotics, cefepime and vancomycin, initiated as well as weight-based fluids. Patient intubated in the ED as patient's respiratory rate persisted to be elevated ~40 and patient quickly became altered even though he was responding and talking on my initial evaluation.  Versed, fentanyl ordered.  I  consulted critical care and spoke with Dr. Valeta Harms who will evaluate the patient and admit to ICU for further management.  I attempted to contact patient's significant other who called wanting update, however the first call filled after several rings without voicemail.  I called back and got a voicemail and left a message for her to return call to daytime provider, Georga Kaufmann, PA-C.  Patient also evaluated by Dr. Kathrynn Humble who guided the patient management, assisted with intubation, and agrees with plan.  Final Clinical Impressions(s) / ED Diagnoses   Final diagnoses:  Acute respiratory failure with hypoxia and hypercarbia [facility])  Septic shock [facility])    ED Discharge Orders    None       Frederica Kuster, PA-C 08/17/2017 0750    Frederica Kuster, PA-C 08/17/17 [place], Ankit, MD 08/18/17 [phone]

## 2017-08-16 NOTE — Progress Notes (Signed)
Palliative Medicine RN Note: Consult order noted. Patient is actively admitted to [facility]. I spoke with their RN Bevely Palmer, who has already met with the family and MD today.   [facility] manages [facility] on their patients when they are admitted, and Bevely Palmer reports this has already been handled. They do not feel there is a role for PMT at this time. Consult will be cancelled; they will request reconsult if needed in the future.  Marjie Skiff Runette Scifres, RN, BSN, [facility] Palliative Medicine Team 08/14/2017 11:08 AM Office [phone]

## 2017-08-16 NOTE — ED Notes (Signed)
Critical Care physician in room with family at this time.

## 2017-08-16 NOTE — ED Notes (Signed)
Decision made to intubate the pt due to altered blood gases.
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
## 2017-08-16 NOTE — Progress Notes (Addendum)
Jerry Terrell and [facility] RN Visit.    This is a related and covered GIP admission of 08/07/2017 with [facility] diagnosis of Pancreatic Cancer.  Code status is now DNR.  Family notified [facility] on call that patient was transported to ED for SOB and has been intubated.    Patient admitted for respiratory failure, bilateral pleural effusions and likely pneumonia.  Day 0 of [facility] GIP.   Visited with patient and family at bedside in ED prior to transfer to unit. Patient is intubated and sedated. His significant other, [name] and other family members at bedside. Georgann Housekeeper, NP with [facility] Critical care team also present and discussing goals of care and treatment plan with family. Current plan is to treat with antibiotics and look at weaning off vent in 2 days and not pursue any other agressive measures.   Continuous medications: Maxipime 1gm IVPB QD, Pepcid 20mg  IVPB QD, fentanyl 2570mcg @ 2.5-4.0 ml/hr, Diprivan 1000mg /142ml @0 -49ml/hr,  Vancomycin IVPB 750mg  QD. PRN medications: etomidate given @ 0648, fentanyl @ 5993, [id number], zemuron @ [id number].   MD notes: ASSESSMENT / PLAN: Acute on chronic hypoxemic respiratory failure (2L [place] at home): multifactorial in the setting of bilateral pleural effusions and likely pneumonia. 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 Family unclear if malignant fluid. He is also at higher risk of DVT/PE with hypoxia out of proportion to chest x ray findings.  - Full vent support with daily SBT - Empiric antibiotics Cefepime and Vancomycin as above - Unable to diurese due to BP concerns - Venous dopplers of lower extremities to assess for DVT - Can pursue CTA if PE raises on ddx.  Severe Sepsis: HCAP. BP improved with volume.  - ABX as above - Follow blood/tracheal cultures - Check procalcitonin  - Follow WBC and fever curve. 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 Treatment currently on hold. S/P pancreatectomy and splenectomy.  - Supportive care - Poor prognosis AKI Hyponatremia Hypoerkalemia - Gentle hydration (3+L given in ED) - Repeat labs this afternoon and in AM.  Acute metabolic encephalopathy - RASS goal -1 to -2 - Propofol infusion started in ED, but caused some hypotension. Start fentanyl infusion and wean propofol down, to off, if able.  - Daily WUA Hepatitis C: LFT essentially wnl. Tbili 1.8 Diabetes Mellitus - CBG monitoring and SSI  GOC: DNR and comfort care with goal to wean off ventilator.   [facility]: Spoke with family and SO, Denise at bedside during visit.   IDG: Team notified of admission and treatment plan   Dr. Lyman Speller  and Dr. [name], NP notified of admission. Transfer Summary and medication list placed on shadow chart.    Will continue to follow while hospitalized and anticipate discharge needs. Please call [facility] if patient needs ambulance transport at discharge.     Thank you   [facility] Liaison  [phone]     All Hospital Liaisons are on AMION

## 2017-08-16 NOTE — ED Notes (Signed)
Pt was intubated per EDMD, for Hypercarbic Respiratory Failure, and AMS. Pt is now of mechanical ventilatory support. Tolerating it well.

## 2017-08-16 NOTE — Sedation Documentation (Signed)
Attempt #2 by Dr. Kathrynn Humble unsuccessful.

## 2017-08-16 NOTE — Progress Notes (Signed)
RT NOTE: Unable to placed A-line after 4x attempts.

## 2017-08-16 NOTE — Progress Notes (Signed)
Critical ABG values hand delivered to MD Nanavati. MD Wants to prepare for intubation.

## 2017-08-16 NOTE — ED Notes (Signed)
Pt was brought in by [facility] as they diverted to this facility because they could not get IV access. Pt is altered and tach

## 2017-08-16 NOTE — Sedation Documentation (Signed)
Intubation attempt #1 by Armstead Peaks, PA. Unsuccessful.

## 2017-08-16 NOTE — ED Triage Notes (Addendum)
Pt arrived via [facility]; pt from hm with c/o of SOB; increased [place] from 2L to $L without relief and then attempted to ambulate to living room and pt reportly had syncopal episode. FD arrived and sats wer 76%; pt placed on non-rebreather and sating at 96%; diminished lung sounds; Pt has hx of stomach CA; pt has decreased appetite; CBG 60 on arrival given 1 glucose tablet and CBG 54; glucagon given; CBG arrival 55; 116; 38 R; 140/98;

## 2017-08-16 NOTE — Progress Notes (Signed)
Inpatient Diabetes Program Recommendations  AACE/ADA: New Consensus Statement on Inpatient Glycemic Control (2015)  Target Ranges:  Prepandial:   less than 140 mg/dL      Peak postprandial:   less than 180 mg/dL (1-2 hours)      Critically ill patients:  140 - 180 mg/dL    Review of Glycemic Control  Diabetes history: DM 2 Outpatient Diabetes medications: Lantus 12 units, Novolog 3 units tid  Current orders for Inpatient glycemic control: None  Inpatient Diabetes Program Recommendations:    Patient has Hx of DM and take insulin at home. Noted hypoglycemia on admission. Consider CBGs Q4 and if glucose consistently elevated consider ICU protocol 2-6 units Q4 hours.  Thanks,  Tama Headings RN, MSN, BC-ADM Inpatient Diabetes Coordinator Team Pager [phone] (8a-5p)

## 2017-08-16 NOTE — Sedation Documentation (Signed)
Third intubation attempt successful by Dr. Kathrynn Humble.

## 2017-08-16 NOTE — Progress Notes (Signed)
RT NOTE: Transported to room 3M09 without incident.

## 2017-08-16 NOTE — ED Notes (Signed)
Providers notified that the pt has become increasingly altered since being seen last.

## 2017-08-16 NOTE — ED Notes (Signed)
RT in for A-Line placement.
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
## 2017-08-16 NOTE — ED Notes (Signed)
Family at bedside. 

## 2017-08-17 ENCOUNTER — Inpatient Hospital Stay (HOSPITAL_COMMUNITY)

## 2017-08-17 DIAGNOSIS — A419 Sepsis, unspecified organism: Secondary | ICD-10-CM

## 2017-08-17 DIAGNOSIS — R6521 Severe sepsis with septic shock: Secondary | ICD-10-CM

## 2017-08-17 DIAGNOSIS — L8990 Pressure ulcer of unspecified site, unspecified stage: Secondary | ICD-10-CM

## 2017-08-17 DIAGNOSIS — J9602 Acute respiratory failure with hypercapnia: Secondary | ICD-10-CM

## 2017-08-17 DIAGNOSIS — J9601 Acute respiratory failure with hypoxia: Secondary | ICD-10-CM

## 2017-08-17 LAB — GLUCOSE, CAPILLARY
GLUCOSE-CAPILLARY: 113 mg/dL — AB (ref 70–99)
GLUCOSE-CAPILLARY: 144 mg/dL — AB (ref 70–99)
GLUCOSE-CAPILLARY: 162 mg/dL — AB (ref 70–99)
Glucose-Capillary: 111 mg/dL — ABNORMAL HIGH (ref 70–99)
Glucose-Capillary: 113 mg/dL — ABNORMAL HIGH (ref 70–99)
Glucose-Capillary: 128 mg/dL — ABNORMAL HIGH (ref 70–99)
Glucose-Capillary: 184 mg/dL — ABNORMAL HIGH (ref 70–99)

## 2017-08-17 LAB — BASIC METABOLIC PANEL
ANION GAP: 18 — AB (ref 5–15)
Anion gap: 18 — ABNORMAL HIGH (ref 5–15)
Anion gap: 14 (ref 5–15)
Anion gap: 18 — ABNORMAL HIGH (ref 5–15)
BUN: 29 mg/dL — ABNORMAL HIGH (ref 6–20)
BUN: 26 mg/dL — ABNORMAL HIGH (ref 6–20)
BUN: 30 mg/dL — ABNORMAL HIGH (ref 6–20)
BUN: 31 mg/dL — ABNORMAL HIGH (ref 6–20)
CALCIUM: 7.9 mg/dL — AB (ref 8.9–10.3)
CALCIUM: 8.1 mg/dL — AB (ref 8.9–10.3)
CO2: 17 mmol/L — ABNORMAL LOW (ref 22–32)
CO2: 20 mmol/L — ABNORMAL LOW (ref 22–32)
CO2: 16 mmol/L — AB (ref 22–32)
CO2: 16 mmol/L — ABNORMAL LOW (ref 22–32)
CREATININE: 1.41 mg/dL — AB (ref 0.61–1.24)
CREATININE: 1.51 mg/dL — AB (ref 0.61–1.24)
Calcium: 8.1 mg/dL — ABNORMAL LOW (ref 8.9–10.3)
Calcium: 8.3 mg/dL — ABNORMAL LOW (ref 8.9–10.3)
Chloride: 98 mmol/L (ref 98–111)
Chloride: 101 mmol/L (ref 98–111)
Chloride: 100 mmol/L (ref 98–111)
Chloride: 101 mmol/L (ref 98–111)
Creatinine, Ser: 1.5 mg/dL — ABNORMAL HIGH (ref 0.61–1.24)
Creatinine, Ser: 1.45 mg/dL — ABNORMAL HIGH (ref 0.61–1.24)
GFR calc non Af Amer: 53 mL/min — ABNORMAL LOW (ref >60)
GFR calc non Af Amer: 49 mL/min — ABNORMAL LOW (ref >60)
GFR calc non Af Amer: 49 mL/min — ABNORMAL LOW (ref >60)
GFR, EST AFRICAN AMERICAN: 57 mL/min — AB (ref >60)
GFR, EST AFRICAN AMERICAN: 57 mL/min — AB (ref >60)
GFR, EST AFRICAN AMERICAN: 59 mL/min — AB (ref >60)
GFR, EST NON AFRICAN AMERICAN: 51 mL/min — AB (ref >60)
GLUCOSE: 139 mg/dL — AB (ref 70–99)
Glucose, Bld: 114 mg/dL — ABNORMAL HIGH (ref 70–99)
Glucose, Bld: 122 mg/dL — ABNORMAL HIGH (ref 70–99)
Glucose, Bld: 142 mg/dL — ABNORMAL HIGH (ref 70–99)
POTASSIUM: 5.3 mmol/L — AB (ref 3.5–5.1)
POTASSIUM: 5.5 mmol/L — AB (ref 3.5–5.1)
Potassium: 5.9 mmol/L — ABNORMAL HIGH (ref 3.5–5.1)
Potassium: 5 mmol/L (ref 3.5–5.1)
SODIUM: 133 mmol/L — AB (ref 135–145)
SODIUM: 135 mmol/L (ref 135–145)
SODIUM: 134 mmol/L — AB (ref 135–145)
SODIUM: 135 mmol/L (ref 135–145)

## 2017-08-17 LAB — MAGNESIUM
MAGNESIUM: 2.1 mg/dL (ref 1.7–2.4)
Magnesium: 2 mg/dL (ref 1.7–2.4)
Magnesium: 2.1 mg/dL (ref 1.7–2.4)

## 2017-08-17 LAB — PHOSPHORUS
Phosphorus: 3.7 mg/dL (ref 2.5–4.6)
Phosphorus: 3.7 mg/dL (ref 2.5–4.6)
Phosphorus: 3.9 mg/dL (ref 2.5–4.6)

## 2017-08-17 LAB — CBC
HCT: 41 % (ref 39.0–52.0)
Hemoglobin: 13.2 g/dL (ref 13.0–17.0)
MCH: 30.5 pg (ref 26.0–34.0)
MCHC: 32.2 g/dL (ref 30.0–36.0)
MCV: 94.7 fL (ref 78.0–100.0)
PLATELETS: 218 10*3/uL (ref 150–400)
RBC: 4.33 MIL/uL (ref 4.22–5.81)
RDW: 15.9 % — ABNORMAL HIGH (ref 11.5–15.5)
WBC: 13.2 10*3/uL — AB (ref 4.0–10.5)

## 2017-08-17 LAB — HIV ANTIBODY (ROUTINE TESTING W REFLEX): HIV Screen 4th Generation wRfx: NONREACTIVE

## 2017-08-17 MED ORDER — VITAL AF 1.2 CAL PO LIQD
1500.0000 mL | ORAL | Status: DC
  Filled 2017-08-17 (×2): qty 1500

## 2017-08-17 MED ORDER — VITAL AF 1.2 CAL PO LIQD: 1000.0000 mL | ORAL | Status: DC

## 2017-08-17 MED ORDER — VITAL HIGH PROTEIN PO LIQD
1000.0000 mL | ORAL | Status: DC
  Administered 2017-08-17: 1000 mL

## 2017-08-17 NOTE — Progress Notes (Signed)
Pt is a [facility] homecare pt.  Pt's significant other as well as sister and nephew in the room.  Family hopeful that pt is going to improve and return home.  Significant other says that feedings are going to be initiated to provide nutrition to pt.  [facility] hospital liaison and LCSW will continue to follow pt while he is hospitalized.

## 2017-08-17 NOTE — Progress Notes (Signed)
PULMONARY / CRITICAL CARE MEDICINE   Name: Jerry Terrell MRN: [id number] DOB: Feb 18, 1960    ADMISSION DATE:  07/25/2017 CONSULTATION DATE:  7/29  REFERRING MD:  Dr. Kathrynn Humble EDP  CHIEF COMPLAINT:  Respiratory failure  BRIEF SUMMARY:  57 year old male with pancreatic cancer followed at [facility].  First diagnosed in August of 2015 as invasive adenocarcinoma of the pancreatic tail, then progressed by November of 2018 to stage IV despite multiple different treatment regimens as outlined in heme/onc note from 7/29, including initial pancreatectomy and splenectomy. He has been recommended Hospice care, but is reluctant. Currently undergoing a pause in treatment, but is being considered for a cycle of gemcitabine and abraxane. He has felt too weak to undergo further treatment. Feels he "needs to improve" before having any further.   7/29 he presented to [facility] ED with complaints of dyspnea and a syncopal episode. Upon arrival to the ED he was profoundly hypoxemic, ultimately requiring intubation and his mental status diminished from alert and oriented, to lethargic.  Imaging concerning for bilateral pleural effusions and possible consolidation. He was started on broad spectrum antibiotics for possible HCAP and PCCM was called for admission.    SUBJECTIVE:  RT reports pt failed SBT with high respiratory rate.  Sedation being decreased / pt comfortable on 48mcg propofol, 50 mcg fentanyl. Tmax 100.4.  I/O- positive 794 since admit. Fiancee at bedside.    VITAL SIGNS: BP 104/83 (BP Location: Left Arm)   Pulse 93   Temp 97.9 F (36.6 C)   Resp (!) 31   Ht 5\' 8"  (1.727 m)   Wt 103 lb 6.3 oz (46.9 kg)   SpO2 100%   BMI 15.72 kg/m   HEMODYNAMICS:    VENTILATOR SETTINGS: Vent Mode: PRVC FiO2 (%):  [40 %-45 %] 40 % Set Rate:  [20 bmp] 20 bmp Vt Set:  [500 mL-580 mL] 500 mL PEEP:  [5 cmH20] 5 cmH20 Plateau Pressure:  [24 TSV77-93 cmH20] 24 cmH20  INTAKE / OUTPUT: I/O last 3 completed  shifts: In: 1730.2 [I.V.:1564; IV Piggyback:166.2] Out: 9030 [Urine:1445]  PHYSICAL EXAMINATION: General: frail, cachectic adult male in NAD on vent  HEENT: MM pink/moist, ETT, temporal wasting  Neuro: sedate, eyes open to voice CV: s1s2 rrr, no m/r/g PULM: even/non-labored, lungs bilaterally coarse rhonchi throughout, diminished right base  SP:QZRAQTMA, multiple old surgical scars Extremities: warm/dry, no edema  Skin: no rashes or lesions  LABS:  BMET Recent Labs  Lab 08/04/2017 1455 08/17/17 0131 08/17/17 0446  NA 133* 133* 135  K 5.1 5.9* 5.0  CL 98 98 101  CO2 19* 17* 20*  BUN 26* 29* 26*  CREATININE 1.34* 1.41* 1.51*  GLUCOSE 136* 114* 122*    Electrolytes Recent Labs  Lab 07/22/2017 1127 08/14/2017 1455 07/20/2017 1554 08/17/17 0131 08/17/17 0446  CALCIUM  --  7.9*  --  7.9* 8.1*  MG 2.2  --  1.9  --  2.1  PHOS  --   --  2.8  --  3.7    CBC Recent Labs  Lab 08/01/2017 0608 08/17/17 0446  WBC 15.0* 13.2*  HGB 13.2 13.2  HCT 41.1 41.0  PLT 210 218    Coag's Recent Labs  Lab 08/15/2017 1455  INR 1.50    Sepsis Markers Recent Labs  Lab 08/11/2017 0935 07/22/2017 1212 08/05/2017 1455  LATICACIDVEN 6.45* 5.5* 4.4*  PROCALCITON  --   --  12.34    ABG Recent Labs  Lab 07/23/2017 [phone] 07/22/2017 3545  PHART 7.163* 7.476*  PCO2ART 87.0* 23.2*  PO2ART 80.0* 123.0*    Liver Enzymes Recent Labs  Lab 07/29/2017 0608  AST 49*  ALT 21  ALKPHOS 122  BILITOT 1.8*  ALBUMIN 2.3*    Cardiac Enzymes No results for input(s): TROPONINI, PROBNP in the last 168 hours.  Glucose Recent Labs  Lab 07/28/2017 1157 08/11/2017 1545 07/22/2017 1935 07/24/2017 2314 08/17/17 0329 08/17/17 0806  GLUCAP 151* 135* 129* 113* 111* 113*    Imaging Ct Angio Chest Pe W Or Wo Contrast  Result Date: 08/17/2017 CLINICAL DATA:  Pancreatic cancer. Respiratory failure with shortness of breath, bilateral pleural effusions, and probable pneumonia. Intubated. EXAM: CT ANGIOGRAPHY  CHEST CT ABDOMEN AND PELVIS WITH CONTRAST TECHNIQUE: Multidetector CT imaging of the chest was performed using the standard protocol during bolus administration of intravenous contrast. Multiplanar CT image reconstructions and MIPs were obtained to evaluate the vascular anatomy. Multidetector CT imaging of the abdomen and pelvis was performed using the standard protocol during bolus administration of intravenous contrast. CONTRAST:  142mL ISOVUE-370 IOPAMIDOL (ISOVUE-370) INJECTION 76% COMPARISON:  CTA chest dated 08/28/2014. CT abdomen/pelvis dated 07/07/2013. FINDINGS: CTA CHEST FINDINGS Cardiovascular: Satisfactory opacification of the bilateral pulmonary artery to the lobar level. No evidence of pulmonary embolism. No evidence of thoracic aortic aneurysm or dissection. The heart is normal in size.  No pericardial effusion. Left chest port terminates at the cavoatrial junction. Mediastinum/Nodes: Due to extreme early arterial phase, there is poor soft tissue contrast of the mediastinum. As such, nodal status cannot be assessed. However, there is a 2.9 cm short axis left supraclavicular nodal mass (series 9/image 5). Thyroid is incompletely evaluated but grossly unremarkable. Lungs/Pleura: Endotracheal tube terminates at the thoracic inlet, 7 cm above the carina. Large bilateral pleural effusions. Suspected complexity on the right (series 9/image 70), suspicious for malignant pleural effusion. Suspected pleural-based metastases overlying the bilateral hemidiaphragms, better evaluated on the CT abdomen/pelvis study (series 7/images 11 and 24). Compressive atelectasis in the right middle lobe and left lower lobe. Possible compressive atelectasis of the right lower lobe, although underlying mass is suspected (series 9/image 90), worrisome for tumor. Additional multifocal patchy and ground-glass opacities in the bilateral upper lobes, right middle lobe, and superior segment left lower lobe, favoring multifocal  pneumonia over interstitial edema. Technically speaking, some of this appearance could also reflect tumor (for example, series 10/image 35 in the left upper lobe), although this is considered less likely given the overall patchy appearance. No pneumothorax. Musculoskeletal: Visualized osseous structures are within normal limits. Review of the MIP images confirms the above findings. CT ABDOMEN and PELVIS FINDINGS Limited evaluation due to poor soft tissue contrast in the abdomen/pelvis. Hepatobiliary: Innumerable lesions throughout the liver, measuring up to 9 mm (series 7/image 21), suspicious for metastases. Status post cholecystectomy. No intrahepatic or extrahepatic ductal dilatation. Pancreas: Not visualized, likely surgically absent. Spleen: Surgically absent. Adrenals/Urinary Tract: Right adrenal gland is grossly unremarkable. Left adrenal gland is not discretely visualized. Right kidney is unremarkable. Infiltrating mass in the left kidney and extending into the left renal sinus, measuring approximately 8.9 x 4.9 cm. Indwelling left double-pigtail ureteral stent. Bladder is notable for an indwelling Foley catheter and nondependent gas. Stomach/Bowel: Suspected distal gastrectomy with gastrojejunostomy. Enteric tube terminates in jejunum. No evidence of bowel obstruction. Large volume colonic stool burden. Vascular/Lymphatic: No evidence of abdominal aortic aneurysm. Atherosclerotic calcifications of the abdominal aorta and branch vessels. 1.7 cm short axis right retrocrural node (series 7/image 13). 2.1 cm short axis gastrohepatic node (  series 7/image 21). 5.8 x 3.9 cm nodal mass in the left para-aortic region along the course of the ureteral stent (series 7/image 33). Reproductive: Prostate is grossly unremarkable. Other: No definite abdominopelvic ascites, although evaluation is difficult due to poor soft tissue contrast. Musculoskeletal: Low density involving the left psoas muscle (relative to the right;  series 7/image 41) may reflect fatty atrophy/denervation injury, although additional tumor involvement is not excluded. Sclerotic metastasis along the posterior aspect of the L2 vertebral body (sagittal image 68). Review of the MIP images confirms the above findings. IMPRESSION: No evidence of pulmonary embolism. Multifocal patchy pulmonary opacities, favoring multifocal pneumonia, less likely interstitial edema. Associated large bilateral pleural effusions, likely malignant, with associated pleural-based metastases. Right lower lobe atelectasis/collapse with suspected underlying right lower lobe mass. Left supraclavicular nodal metastasis. Status post pancreatectomy, splenectomy, cholecystectomy, and suspected partial gastrectomy with gastrojejunostomy. Widespread hepatic metastases. Retrocrural, upper abdominal, and para-aortic nodal metastases. Tumor involvement of the left kidney and ureter, with indwelling ureteral stent. Sclerotic metastasis at L2. Electronically Signed   By: Julian Hy M.D.   On: 08/17/2017 01:25   Ct Abdomen Pelvis W Contrast  Result Date: 08/17/2017 CLINICAL DATA:  Pancreatic cancer. Respiratory failure with shortness of breath, bilateral pleural effusions, and probable pneumonia. Intubated. EXAM: CT ANGIOGRAPHY CHEST CT ABDOMEN AND PELVIS WITH CONTRAST TECHNIQUE: Multidetector CT imaging of the chest was performed using the standard protocol during bolus administration of intravenous contrast. Multiplanar CT image reconstructions and MIPs were obtained to evaluate the vascular anatomy. Multidetector CT imaging of the abdomen and pelvis was performed using the standard protocol during bolus administration of intravenous contrast. CONTRAST:  151mL ISOVUE-370 IOPAMIDOL (ISOVUE-370) INJECTION 76% COMPARISON:  CTA chest dated 08/28/2014. CT abdomen/pelvis dated 07/07/2013. FINDINGS: CTA CHEST FINDINGS Cardiovascular: Satisfactory opacification of the bilateral pulmonary artery to the  lobar level. No evidence of pulmonary embolism. No evidence of thoracic aortic aneurysm or dissection. The heart is normal in size.  No pericardial effusion. Left chest port terminates at the cavoatrial junction. Mediastinum/Nodes: Due to extreme early arterial phase, there is poor soft tissue contrast of the mediastinum. As such, nodal status cannot be assessed. However, there is a 2.9 cm short axis left supraclavicular nodal mass (series 9/image 5). Thyroid is incompletely evaluated but grossly unremarkable. Lungs/Pleura: Endotracheal tube terminates at the thoracic inlet, 7 cm above the carina. Large bilateral pleural effusions. Suspected complexity on the right (series 9/image 70), suspicious for malignant pleural effusion. Suspected pleural-based metastases overlying the bilateral hemidiaphragms, better evaluated on the CT abdomen/pelvis study (series 7/images 11 and 24). Compressive atelectasis in the right middle lobe and left lower lobe. Possible compressive atelectasis of the right lower lobe, although underlying mass is suspected (series 9/image 90), worrisome for tumor. Additional multifocal patchy and ground-glass opacities in the bilateral upper lobes, right middle lobe, and superior segment left lower lobe, favoring multifocal pneumonia over interstitial edema. Technically speaking, some of this appearance could also reflect tumor (for example, series 10/image 35 in the left upper lobe), although this is considered less likely given the overall patchy appearance. No pneumothorax. Musculoskeletal: Visualized osseous structures are within normal limits. Review of the MIP images confirms the above findings. CT ABDOMEN and PELVIS FINDINGS Limited evaluation due to poor soft tissue contrast in the abdomen/pelvis. Hepatobiliary: Innumerable lesions throughout the liver, measuring up to 9 mm (series 7/image 21), suspicious for metastases. Status post cholecystectomy. No intrahepatic or extrahepatic ductal  dilatation. Pancreas: Not visualized, likely surgically absent. Spleen: Surgically absent. Adrenals/Urinary Tract:  Right adrenal gland is grossly unremarkable. Left adrenal gland is not discretely visualized. Right kidney is unremarkable. Infiltrating mass in the left kidney and extending into the left renal sinus, measuring approximately 8.9 x 4.9 cm. Indwelling left double-pigtail ureteral stent. Bladder is notable for an indwelling Foley catheter and nondependent gas. Stomach/Bowel: Suspected distal gastrectomy with gastrojejunostomy. Enteric tube terminates in jejunum. No evidence of bowel obstruction. Large volume colonic stool burden. Vascular/Lymphatic: No evidence of abdominal aortic aneurysm. Atherosclerotic calcifications of the abdominal aorta and branch vessels. 1.7 cm short axis right retrocrural node (series 7/image 13). 2.1 cm short axis gastrohepatic node (series 7/image 21). 5.8 x 3.9 cm nodal mass in the left para-aortic region along the course of the ureteral stent (series 7/image 33). Reproductive: Prostate is grossly unremarkable. Other: No definite abdominopelvic ascites, although evaluation is difficult due to poor soft tissue contrast. Musculoskeletal: Low density involving the left psoas muscle (relative to the right; series 7/image 41) may reflect fatty atrophy/denervation injury, although additional tumor involvement is not excluded. Sclerotic metastasis along the posterior aspect of the L2 vertebral body (sagittal image 68). Review of the MIP images confirms the above findings. IMPRESSION: No evidence of pulmonary embolism. Multifocal patchy pulmonary opacities, favoring multifocal pneumonia, less likely interstitial edema. Associated large bilateral pleural effusions, likely malignant, with associated pleural-based metastases. Right lower lobe atelectasis/collapse with suspected underlying right lower lobe mass. Left supraclavicular nodal metastasis. Status post pancreatectomy,  splenectomy, cholecystectomy, and suspected partial gastrectomy with gastrojejunostomy. Widespread hepatic metastases. Retrocrural, upper abdominal, and para-aortic nodal metastases. Tumor involvement of the left kidney and ureter, with indwelling ureteral stent. Sclerotic metastasis at L2. Electronically Signed   By: Julian Hy M.D.   On: 08/17/2017 01:25   Dg Chest Port 1 View  Result Date: 08/17/2017 CLINICAL DATA:  Follow-up pleural effusion EXAM: PORTABLE CHEST 1 VIEW COMPARISON:  CT from earlier in the same day. FINDINGS: Cardiac shadow is stable. Endotracheal tube, nasogastric catheter and left chest wall port are again seen and stable. Large right-sided pleural effusion is noted. The large left-sided pleural effusion is not as well appreciated on this more upright image. Patchy infiltrative changes are noted throughout both lung similar to that seen on recent chest CT. IMPRESSION: Stable bilateral infiltrates as well as large right-sided pleural effusion. The left effusion is not as well appreciated on today's exam. Electronically Signed   By: Inez Catalina M.D.   On: 08/17/2017 07:44    STUDIES:  Venous doppler 7/29 > CTA Chest, ABD/Pelvis 7/30 >> neg for PE, multifocal patchy opacities, RLL atelectasis, suspected underlying RLL mass, left supraclavicular nodal metastasis.  S/P pancreatectomy, splenectomy, cholecystectomy and suspected partial gastrectomy with gastrojejunostomy, widespread hepatic metastases, upper abdominal metastases, tumor involvement of left kidney, ureter with indwelling stent, & sclerotic mets at L2  CULTURES: Blood 7/29 > Sputum 7/29 >  ANTIBIOTICS: Cefepime 7/29 > Vancomycin 7/29 >  SIGNIFICANT EVENTS: 7/29 admit for respiratory failure, intubated.   LINES/TUBES: ETT 7/29 >  DISCUSSION: 57 year old male with stage IV pancreatic Ca initially diagnosed in 2015. Chemo currently on hold due to weakness. Deteriorating condition over the past several  months. Now admitted with multifactorial respiratory failure. Intubated in ED. Family understands prognosis is poor. Being treated for HCAP conservatively. DNR, no pressors. Family wants to give him few days of conservative treatment to see if he can be liberated from vent. If not they would opt for comfort measures.   ASSESSMENT / PLAN:  Acute on chronic hypoxemic respiratory failure (  2L [place] at home): multifactorial in the setting of bilateral pleural effusions (R>L) and likely pneumonia. 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 Family unclear if malignant fluid. PE ruled out.  P: PRVC 8 cc/kg Wean PEEP / fiO2 for sats >90% Follow intermittent CXR  Can not diuresed due to soft BP Venous doppler pending  Continue daily efforts at PSV weaning > hope to be able to get him to a point of extubation to allow family time with him Discussed ETT holder with RT > regular adult holder too large for face.  ? If pediatric tube holder available or other option as current situation is not ideal.   Severe Sepsis: HCAP. BP improved with volume.  P: Follow cultures  ABX as above  Trend PCT  Follow WBC, fever curve   Metabolic acidosis: lactic, may not completely clear due to metastatic renal/hepatic involvement P: Trend lactate   Pancreatic adenocarcinoma: treated at [facility]. Treatment currently on hold. S/P pancreatectomy and splenectomy. Extensive metastatic disease on CT 7/30. P: Poor prognosis Supportive care  AKI Hyponatremia Hypoerkalemia P: Trend BMP / urinary output Replace electrolytes as indicated Avoid nephrotoxic agents, ensure adequate renal perfusion  Acute metabolic encephalopathy RASS Goal: -0 to -1 Wean Propofol to off as able  Fentanyl gtt for pain  Daily WUA / SBT as able   Hepatitis C: LFT essentially wnl. Tbili 1.8  Diabetes Mellitus P: CBG with SSI   Severe Protein Calorie Malnutrition  P: Begin TF at  trickle, no increase for now with possibility of refeeding syndrome Monitor lytes with feeding    Diet: NPO VTE ppx: Heparin SQ Dispo: ICU Code: DNR, no escalation. Short term vent.   Family: Fiance is HCPOA. Some family supposedly unstable. Patient has been made XXX.   CC Time: 30 minutes   Noe Gens, NP-C [facility] Pgr: [phone] or if no answer [phone] 08/17/2017, 12:02 PM

## 2017-08-17 NOTE — Progress Notes (Signed)
It was noted in the provider notes that patient was to be a XXX but is not currently not listed as one on chart. Spoke with Jerry Terrell, patient's significant other, regarding this matter and explained the meaning of XXX. Jerry Terrell stated this was not necessary and she had decided not to make the patient a XXX since the phone password system is in place to protect the patients information.

## 2017-08-17 NOTE — Progress Notes (Signed)
Initial Nutrition Assessment  DOCUMENTATION CODES:   Severe malnutrition in context of chronic illness, Underweight  INTERVENTION:   Tube Feeding:  Begin Vital AF 1.2 @ 10 ml/hr  Goal rate to meet nutritional needs is 60 ml/hr (if no propofol infusing) Provides 1728 kcals, 108 g of protein, 1166 mL of free water Recommend slow titration as pt is at high risk for refeeding  Monitor magnesium, potassium, and phosphorus daily for at least 3 days, MD to replete as needed, as pt is at risk for refeeding syndrome given SEVERE MALNUTRITION.   NUTRITION DIAGNOSIS:   Severe Malnutrition related to (stage IV pancreatic cancer) as evidenced by severe fat depletion, severe muscle depletion.   GOAL:   Patient will meet greater than or equal to 90% of their needs   MONITOR:   TF tolerance, Vent status, Skin, Weight trends, Labs  REASON FOR ASSESSMENT:   Consult, Ventilator Enteral/tube feeding initiation and management  ASSESSMENT:   57 yo male admitted with acute on chronic respiratory failure (on 2L home oxygen at baseline) with b/l pleural effusions and pneumonia, severe sepsis . Pt with pancreatic cancer followed at [facility]; first diagnosed in August of 2015 which progressed to stage IV in 2018 despite multiple interventions including pancreatectomy and splenectomy. Pt with additional hx of CKD, DM, hepatitis C, HTN     CT abdomen with extensive metastatic disease  No family at bedside; unable to obtain diet and weight history but based on physical exam, pt likely with poor nutritional status for a while.   Patient is currently intubated on ventilator support MV: 14.6 L/min Temp (24hrs), Avg:99.4 F (37.4 C), Min:97.9 F (36.6 C), Max:100.4 F (38 C)  Propofol: weaning, currently @ 1.7 ml/hr  OG tube in place; per CT abdomen report, pt with suspected distal gastrectomy with gastrojejunostomy. Enteric tube terminates in jejunum.  No BM since admission, CT abdomen with  large colonic stool burdern  Weight up to 46.9 kg from 45.4 kg on admission; net +<1 L  Labs: sodium 134, potassium 5.3, BUN 30, Creatinine 1.50, phosphorus wdl, magnesium wdl Meds: NS at 100 ml/hr  NUTRITION - FOCUSED PHYSICAL EXAM:    Most Recent Value  Orbital Region  Severe depletion  Upper Arm Region  Severe depletion  Thoracic and Lumbar Region  Severe depletion  Buccal Region  Severe depletion  Temple Region  Severe depletion  Clavicle Bone Region  Severe depletion  Clavicle and Acromion Bone Region  Severe depletion  Scapular Bone Region  Severe depletion  Dorsal Hand  Severe depletion  Patellar Region  Severe depletion  Anterior Thigh Region  Severe depletion  Posterior Calf Region  Severe depletion  Edema (RD Assessment)  None       Diet Order:   Diet Order           Diet NPO time specified  Diet effective now          EDUCATION NEEDS:   Not appropriate for education at this time  Skin:  Skin Assessment: Skin Integrity Issues: Skin Integrity Issues:: Stage II Stage II: coccyx  Last BM:  7/25  Height:   Ht Readings from Last 1 Encounters:  07/21/2017 5\' 8"  (1.727 m)    Weight:   Wt Readings from Last 1 Encounters:  08/17/17 103 lb 6.3 oz (46.9 kg)    Ideal Body Weight:  70 kg; pt is 67% IBW  BMI:  Body mass index is 15.72 kg/m.  Estimated Nutritional Needs:   Kcal:  1797 kcals   Protein:  94-115 g  Fluid:  >/= 1.7 L    Kerman Passey MS, RD, LDN, CNSC [phone] Pager  [phone] Weekend/On-Call Pager

## 2017-08-17 NOTE — Care Management Note (Addendum)
Case Management Note  Patient Details  Name: Jerry Terrell MRN: [id number] Date of Birth: Jul 11, 1960  Subjective/Objective:   From home with [facility], presents with HCAP, bil pl effusion, resp failure on full vent support.     8/2 Tomi Bamberger RN, BSN - family thinking about withdrawing care.                Action/Plan: NCM will follow for transition of care needs.  Expected Discharge Date:                  Expected Discharge Plan:  Home w Hospice Care  In-House Referral:     Discharge planning Services  CM Consult  Post Acute Care Choice:  Resumption of Svcs/PTA Provider Choice offered to:     DME Arranged:    DME Agency:     HH Arranged:    [place] Agency:     Status of Service:  In process, will continue to follow  If discussed at Long Length of Stay Meetings, dates discussed:    Additional Comments:  Zenon Mayo, RN 08/17/2017, 10:01 AM

## 2017-08-17 NOTE — Progress Notes (Signed)
[facility]) SW Visit  As previously documented, this is a related and covered GIP admission of 07/22/2017 with [facility] diagnosis of Pancreatic Cancer. Code status is DNR. Family notified [name] on-call staff that patient was transported to ED for SOB and has been intubated.   Patient was admitted to hospital for respiratory failure, bilateral pleural effusions and likely pneumonia.   Day 1 of [facility] GIP.  Received report from bedside RN Mickel Baas and met with patient's significant other Jerry Terrell at bedside. Both report patient was not able to wean this morning and continues on IV ABX. Patient is intubated and sedated. Jerry Terrell stayed overnight and reports patient seems more relaxed this morning. Per Jerry Terrell family members visited yesterday evening and patient opened his eyes for his sister and another family member. Jerry Terrell expressed hope patient will improve with continued ABX and be able to wean. She is aware [facility] will continue to follow while patient is hospitalized. Chart reviewed. Note CT and Xray results. Per RN, MD to discuss with Jerry Terrell later today.   GOC: DNR and comfort care with goal to wean off ventilator.  PCG: Met with Jerry Terrell during this visit.   IDG: Team was notified of this admission. Updates given to RN and LCSW by phone and VM.   [facility] Dr. Karie Georges and PCP [name] NP notified of this admission. Transfer summary and medication list placed on hospital chart 08/13/2017.  [facility] staff will continue to follow while hospitalized and anticipate discharge needs. If ambulance transport is needed at time of discharge, please use [facility].   Please call with hospice related questions at [phone].  Thank you,  Heloise Purpura, Hospital Liaison [phone]  [place] are listed on AMION

## 2017-08-17 NOTE — Progress Notes (Signed)
RT note: Attempted daily SBT this AM however when switched to CPAP/PSV patient's respiratory rate increased to high 30s.  Placed patient back on full support and is currently tolerating well.  Will continue to monitor.

## 2017-08-17 NOTE — Progress Notes (Signed)
LE venous duplex prelim: negative for DVT. Arturo Sofranko Eunice, RDMS, RVT  

## 2017-08-18 ENCOUNTER — Inpatient Hospital Stay (HOSPITAL_COMMUNITY)

## 2017-08-18 DIAGNOSIS — E43 Unspecified severe protein-calorie malnutrition: Secondary | ICD-10-CM

## 2017-08-18 DIAGNOSIS — J90 Pleural effusion, not elsewhere classified: Secondary | ICD-10-CM

## 2017-08-18 DIAGNOSIS — J910 Malignant pleural effusion: Secondary | ICD-10-CM

## 2017-08-18 LAB — LACTIC ACID, PLASMA
LACTIC ACID, VENOUS: 6.3 mmol/L — AB (ref 0.5–1.9)
LACTIC ACID, VENOUS: 3.9 mmol/L — AB (ref 0.5–1.9)

## 2017-08-18 LAB — GLUCOSE, CAPILLARY
GLUCOSE-CAPILLARY: 173 mg/dL — AB (ref 70–99)
GLUCOSE-CAPILLARY: 105 mg/dL — AB (ref 70–99)
Glucose-Capillary: 120 mg/dL — ABNORMAL HIGH (ref 70–99)
Glucose-Capillary: 123 mg/dL — ABNORMAL HIGH (ref 70–99)
Glucose-Capillary: 124 mg/dL — ABNORMAL HIGH (ref 70–99)
Glucose-Capillary: 221 mg/dL — ABNORMAL HIGH (ref 70–99)

## 2017-08-18 LAB — LACTATE DEHYDROGENASE, PLEURAL OR PERITONEAL FLUID: LD, Fluid: 407 U/L — ABNORMAL HIGH (ref 3–23)

## 2017-08-18 LAB — PHOSPHORUS
PHOSPHORUS: 4.1 mg/dL (ref 2.5–4.6)
Phosphorus: 2.6 mg/dL (ref 2.5–4.6)

## 2017-08-18 LAB — BODY FLUID CELL COUNT WITH DIFFERENTIAL
EOS FL: 0 %
LYMPHS FL: 48 %
MONOCYTE-MACROPHAGE-SEROUS FLUID: 26 % — AB (ref 50–90)
NEUTROPHIL FLUID: 26 % — AB (ref 0–25)
Total Nucleated Cell Count, Fluid: 779 cu mm (ref 0–1000)

## 2017-08-18 LAB — GLUCOSE, PLEURAL OR PERITONEAL FLUID: GLUCOSE FL: 124 mg/dL

## 2017-08-18 LAB — MAGNESIUM
MAGNESIUM: 2 mg/dL (ref 1.7–2.4)
Magnesium: 2.2 mg/dL (ref 1.7–2.4)

## 2017-08-18 MED ORDER — INSULIN ASPART 100 UNIT/ML ~~LOC~~ SOLN
0.0000 [IU] | SUBCUTANEOUS | Status: DC
  Administered 2017-08-18: 2 [IU] via SUBCUTANEOUS
  Administered 2017-08-18: 3 [IU] via SUBCUTANEOUS
  Administered 2017-08-18: 5 [IU] via SUBCUTANEOUS
  Administered 2017-08-18 – 2017-08-20 (×4): 2 [IU] via SUBCUTANEOUS

## 2017-08-18 MED ORDER — ALTEPLASE 2 MG IJ SOLR
2.0000 mg | Freq: Once | INTRAMUSCULAR | Status: AC
  Administered 2017-08-18: 2 mg

## 2017-08-18 MED ORDER — SODIUM CHLORIDE 3 % IN NEBU
4.0000 mL | INHALATION_SOLUTION | Freq: Two times a day (BID) | RESPIRATORY_TRACT | Status: DC
  Administered 2017-08-18 – 2017-08-20 (×4): 4 mL via RESPIRATORY_TRACT
  Filled 2017-08-18 (×4): qty 4

## 2017-08-18 NOTE — Progress Notes (Signed)
Patient's fiancee requests that information be limited to her.  She states she is HCPOA but no paperwork is on file.  I requested she bring it in to the hospital for documentation.    Noe Gens, NP-C  Pulmonary & Critical Care Pgr: [phone] or if no answer [phone] 08/18/2017, 4:24 PM

## 2017-08-18 NOTE — Progress Notes (Signed)
RT note: patient placed on full support ventilation due to decreased sats.  Tolerating well.  Will continue to monitor.

## 2017-08-18 NOTE — Procedures (Signed)
Thoracentesis Procedure Note  Pre-operative Diagnosis: Right pleural effusion   Post-operative Diagnosis: same  Indications: Diagnostic evaluation of pleural fluid & therapeutic relief of dyspnea   Procedure Details  Consent: Informed consent was obtained. Risks of the procedure were discussed including: infection, bleeding, pain, pneumothorax.  Under sterile conditions the patient was positioned. Betadine solution and sterile drapes were utilized.  1% plain lidocaine was used to anesthetize the 8th rib space. Fluid was obtained without any difficulties and minimal blood loss.  A dressing was applied to the wound and wound care instructions were provided.   Findings 1.1 L of bloody pleural fluid was obtained. A sample was sent for infection analysis & to pathology.   Complications:  None; patient tolerated the procedure well.          Condition: stable  Plan: Pleural fluid sent to the lab for analysis  Follow up CXR pending    Noe Gens, NP-C [place] [facility] Pgr: [phone] or if no answer [phone] 08/18/2017, 4:20 PM

## 2017-08-18 NOTE — Progress Notes (Addendum)
Pt became extremely agitated, shaking head back and forth, attempting to pull out ETT, HR-50s, RR-40s, SpO2-59% on FiO2 40% via ventilator. Pt reoriented with little success.  Pt FiO2 increased to 100% on ventilator and pt given 73mcg fentanyl bolus and 2mg  versed IV.  SpO2 remained 60s despite decreased agitation after sedation meds given.  Pt ventilator suctioned with no secretions. Pt disconnected from ventilator and bagged on 100% FiO2 with SpO2 increasing to 93%.  Pt then placed back on ventilator at 80% FiO2. SpO2 now 95%. Pt already ordered routine PCXR at 2100 to evaluate pneumthorax from earlier in the day. Xray called and xray expedited Dr. Lucile Shutters notified of event.  Will continue to monitor pt.

## 2017-08-18 NOTE — Progress Notes (Signed)
[place] ([facility]) GIP RN Visit  Pt admitted on Monday with Respiratory Distress. Hospice was not notified prior to ED visit.  Pt was diagnosed with Pneumonia.  This is a Hospice related admission per Dr Karie Georges at [facility].  Pt is under Hospice services for Pancreatic Cancer.  Pt was a Full Code prior to the hospitalization.  Spoke with [name] at bedside.  She confirmed that the pt is now a DNR but they do want to do everything to try to save the pt up until the time his heart stops.   Reviewed Progress notes.  Plan is for a Thoracentesis. Pt is vent dependent at this time.  Pt not responsive at time of visit.  Spoke with the Pt's nurse Heather.  Pt is no longer on medications for sedation.  Fentanyl dose has been decreased.  Pt remains on  Trickle feedings. Antibiotics continue.  Day 2 of GIP   Please contact [facility] at [phone] at the TOD (after hours.)  Olin Hauser L. Terance Hart RN, [facility] PRN-Hospital Liaison [facility] [place]

## 2017-08-18 NOTE — Progress Notes (Signed)
eLink Physician-Brief Progress Note Patient Name: Jerry Terrell DOB: 1960-05-11 MRN: [id number]   Date of Service  08/18/2017  HPI/Events of Note  Resp distress on the vent. % % pneumothorax from earlier today. PT with past mucus plug as well. Sats now back to 97 % following suctioning although Fio2 temporarily increased to .80. No overt distress.  eICU Interventions  Stat portable CXR to r/o worsening pneumothorax.        Frederik Pear 08/18/2017, 8:49 PM

## 2017-08-18 NOTE — Progress Notes (Addendum)
Less than 5% pneumothorax post thoracentesis.  No evidence of tension pneumothorax.  MD notified.   Plan: Follow up CXR at 2100    Noe Gens, NP-C [place] Pgr: [phone] or if no answer [phone] 08/18/2017, 4:45 PM

## 2017-08-18 NOTE — Progress Notes (Signed)
RT note- At the time of the xray, ETT was at 22cm. ETT is now advanced back to 25 which is at original charted placement.

## 2017-08-18 NOTE — Progress Notes (Signed)
CRITICAL VALUE ALERT  Critical Value:  Lactic 6.3  Date & Time Notied:  8118 7/31  Provider Notified: Minor, Richardson Landry NP  Orders Received/Actions taken: Continue to monitor

## 2017-08-18 NOTE — Progress Notes (Addendum)
PULMONARY / CRITICAL CARE MEDICINE   Name: Jerry Terrell MRN: [id number] DOB: 04/03/1960    ADMISSION DATE:  07/19/2017 CONSULTATION DATE:  7/29  REFERRING MD:  Dr. Kathrynn Humble EDP  CHIEF COMPLAINT:  Respiratory failure  BRIEF SUMMARY:  57 year old male with pancreatic cancer followed at [facility].  First diagnosed in August of 2015 as invasive adenocarcinoma of the pancreatic tail, then progressed by November of 2018 to stage IV despite multiple different treatment regimens as outlined in heme/onc note from 7/29, including initial pancreatectomy and splenectomy. He has been recommended Hospice care, but is reluctant. Currently undergoing a pause in treatment, but is being considered for a cycle of gemcitabine and abraxane. He has felt too weak to undergo further treatment. Feels he "needs to improve" before having any further.   7/29 he presented to [facility] ED with complaints of dyspnea and a syncopal episode. Upon arrival to the ED he was profoundly hypoxemic, ultimately requiring intubation and his mental status diminished from alert and oriented, to lethargic.  Imaging concerning for bilateral pleural effusions and possible consolidation. He was started on broad spectrum antibiotics for possible HCAP and PCCM was called for admission.    SUBJECTIVE: Currently requiring sedation with dipper Lucianne Lei due to agitation.  Suspect he will not liberate from mechanical ventilatory support.  He is a DNR he may need to transition to comfort care in the near future.  VITAL SIGNS: BP 94/73   Pulse 87   Temp (!) 97.3 F (36.3 C)   Resp 16   Ht 5\' 8"  (1.727 m)   Wt 105 lb 6.1 oz (47.8 kg)   SpO2 95%   BMI 16.02 kg/m   HEMODYNAMICS:    VENTILATOR SETTINGS: Vent Mode: PRVC FiO2 (%):  [40 %] 40 % Set Rate:  [16 bmp-20 bmp] 16 bmp Vt Set:  [500 mL] 500 mL PEEP:  [5 cmH20] 5 cmH20 Plateau Pressure:  [23 cmH20-26 cmH20] 26 cmH20  INTAKE / OUTPUT: I/O last 3 completed shifts: In: 4803.4 [I.V.:3689.4;  NG/GT:165; IV Piggyback:949.1] Out: 1305 [Urine:1305]  PHYSICAL EXAMINATION: General: Frail, sedated, cachectic male HEENT: Pupils equal reactive to light, no JVD lymphadenopathy is appreciated Neuro: Sedated on dipper Van drip CV: Regular PULM: Decreased breath sounds in the bases with coarse rhonchi bilaterally GI: Thin faint bowel sounds Extremities: warm/dry, - edema  Skin: no rashes or lesions   LABS:  BMET Recent Labs  Lab 08/17/17 0446 08/17/17 1252 08/17/17 1643  NA 135 134* 135  K 5.0 5.3* 5.5*  CL 101 100 101  CO2 20* 16* 16*  BUN 26* 30* 31*  CREATININE 1.51* 1.50* 1.45*  GLUCOSE 122* 139* 142*    Electrolytes Recent Labs  Lab 08/17/17 0446 08/17/17 1252 08/17/17 1428 08/17/17 1643 08/18/17 0428  CALCIUM 8.1* 8.1*  --  8.3*  --   MG 2.1  --  2.0 2.1 2.2  PHOS 3.7  --  3.7 3.9 4.1    CBC Recent Labs  Lab 08/09/2017 0608 08/17/17 0446  WBC 15.0* 13.2*  HGB 13.2 13.2  HCT 41.1 41.0  PLT 210 218    Coag's Recent Labs  Lab 08/15/2017 1455  INR 1.50    Sepsis Markers Recent Labs  Lab 08/18/2017 0935 07/28/2017 1212 07/21/2017 1455  LATICACIDVEN 6.45* 5.5* 4.4*  PROCALCITON  --   --  12.34    ABG Recent Labs  Lab 08/03/2017 0633 08/02/2017 0942  PHART 7.163* 7.476*  PCO2ART 87.0* 23.2*  PO2ART 80.0* 123.0*  Liver Enzymes Recent Labs  Lab 08/15/2017 0608  AST 49*  ALT 21  ALKPHOS 122  BILITOT 1.8*  ALBUMIN 2.3*    Cardiac Enzymes No results for input(s): TROPONINI, PROBNP in the last 168 hours.  Glucose Recent Labs  Lab 08/17/17 1224 08/17/17 1614 08/17/17 1938 08/17/17 2320 08/18/17 0338 08/18/17 0730  GLUCAP 128* 144* 162* 184* 221* 173*    Imaging No results found.  STUDIES:  Venous doppler 7/29 > CTA Chest, ABD/Pelvis 7/30 >> neg for PE, multifocal patchy opacities, RLL atelectasis, suspected underlying RLL mass, left supraclavicular nodal metastasis.  S/P pancreatectomy, splenectomy, cholecystectomy and  suspected partial gastrectomy with gastrojejunostomy, widespread hepatic metastases, upper abdominal metastases, tumor involvement of left kidney, ureter with indwelling stent, & sclerotic mets at L2  CULTURES: Blood 7/29 > Sputum 7/29 > not done  ANTIBIOTICS: Cefepime 7/29 > Vancomycin 7/29 >  SIGNIFICANT EVENTS: 7/29 admit for respiratory failure, intubated.  7/30/20019 lower extremity Doppler studies are negative  LINES/TUBES: ETT 7/29 >  DISCUSSION: 57 year old male with stage IV pancreatic Ca initially diagnosed in 2015. Chemo currently on hold due to weakness. Deteriorating condition over the past several months. Now admitted with multifactorial respiratory failure. Intubated in ED. Family understands prognosis is poor. Being treated for HCAP conservatively. DNR, no pressors. Family wants to give him few days of conservative treatment to see if he can be liberated from vent. If not they would opt for comfort measures.  08/18/2017 no significant change.  Currently on dipper Van drip for agitation following bath.  ASSESSMENT / PLAN:  Acute on chronic hypoxemic respiratory failure (2L [place] at home): multifactorial in the setting of bilateral pleural effusions (R>L) and likely pneumonia. 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 Family unclear if malignant fluid. PE ruled out.  P:  Vent bundle Wean as tolerated Altered mental status is barrier to weaning Currently requiring sedation for agitation Metastatic lung disease noted with increased pleural effusion  Severe Sepsis: HCAP. BP improved with volume.  P: Follow culture Antibiotics as noted Pressors if needed  Metabolic acidosis: lactic, may not completely clear due to metastatic renal/hepatic involvement P: 08/18/2017 lactate ordered  Pancreatic adenocarcinoma: [facility]. Treatment currently on hold. S/P pancreatectomy and splenectomy. Extensive metastatic disease  on CT 7/30. P: Poor prognosis Supportive care  AKI worsening Hyponatremia resolved Hyperkalemia Lab Results  Component Value Date   CREATININE 1.45 (H) 08/17/2017   CREATININE 1.50 (H) 08/17/2017   CREATININE 1.51 (H) 08/17/2017   CREATININE 1.14 11/17/2012   CREATININE 1.20 02/25/2012   CREATININE 1.08 12/02/2011     Recent Labs  Lab 08/17/17 0446 08/17/17 1252 08/17/17 1643  NA 135 134* 135   Recent Labs  Lab 08/17/17 0446 08/17/17 1252 08/17/17 1643  K 5.0 5.3* 5.5*    P: Monitor electrolytes Avoid nephrotoxins MAR checked to ensure no supplemental potassium was being given 0/73/7106  Acute metabolic encephalopathy RA SS goal 0 to -1 Wean propofol as tolerated note he required reinstitution of propofol 731 for agitation Daily wake up assessment  Hepatitis C: LFT essentially wnl. Tbili 1.8  Diabetes Mellitus P: CBG with SSI   Severe Protein Calorie Malnutrition  P: Begin TF at trickle, no increase for now with possibility of refeeding syndrome Monitor lytes with feeding    Diet: NPO VTE ppx: Heparin SQ Dispo: ICU Code: DNR, no escalation. Short term vent.   Family: Fiance is HCPOA. Some family supposedly unstable.  08/18/2017 fianc updated at bedside. CC Time: 30 minutes   Richardson Landry Shameca Landen ACNP Maryanna Shape [facility] Pager [phone] till 1 pm If no answer page [phone] 08/18/2017, 8:30 AM

## 2017-08-18 NOTE — Clinical Social Work Note (Signed)
Discussed treatment plan with pt's significant other, Denise.  Plan Is for pt to receive aggressive treatments as needed.  Educated that hospice plan of care is for comfort treatment only.  [name] wants to revoke hospice in order for pt to receive aggressive treatments.  Denise aware that pt can be readmitted to hospice in the future if comfort care only becomes the goal of care. [place], [place]

## 2017-08-18 NOTE — Progress Notes (Addendum)
RT note: changed patient's tape securing ETT with assistance of another RT.  Patient ETT noted to be at 22 at the lip.  Advanced ETT back to 25 at the lip.  Once ETT secured, placed patient on CPAP/PSV of 15/5 and is currently tolerating fairly well.  Also order placed for sputum sample.  Placed trap inline with suction however unable to obtain a sample.  Will leave trap hooked inline for when able to obtain sample.  Will continue to monitor.

## 2017-08-18 NOTE — Progress Notes (Signed)
This Rn attempted to draw back blood off IV. No blood return. Called lab and asked them to come up here to draw Lactic acid, Lab person refused to come and told this RN to consult IV and figure out why the line wasn't working anymore.

## 2017-08-19 ENCOUNTER — Inpatient Hospital Stay (HOSPITAL_COMMUNITY)

## 2017-08-19 DIAGNOSIS — J939 Pneumothorax, unspecified: Secondary | ICD-10-CM

## 2017-08-19 LAB — GLUCOSE, CAPILLARY
GLUCOSE-CAPILLARY: 133 mg/dL — AB (ref 70–99)
GLUCOSE-CAPILLARY: 90 mg/dL (ref 70–99)
GLUCOSE-CAPILLARY: 117 mg/dL — AB (ref 70–99)
GLUCOSE-CAPILLARY: 126 mg/dL — AB (ref 70–99)
Glucose-Capillary: 78 mg/dL (ref 70–99)
Glucose-Capillary: 95 mg/dL (ref 70–99)

## 2017-08-19 LAB — BASIC METABOLIC PANEL
ANION GAP: 8 (ref 5–15)
BUN: 40 mg/dL — ABNORMAL HIGH (ref 6–20)
CALCIUM: 8.2 mg/dL — AB (ref 8.9–10.3)
CHLORIDE: 109 mmol/L (ref 98–111)
CO2: 23 mmol/L (ref 22–32)
CREATININE: 1.38 mg/dL — AB (ref 0.61–1.24)
GFR calc Af Amer: >60 mL/min (ref >60)
GFR calc non Af Amer: 54 mL/min — ABNORMAL LOW (ref >60)
GLUCOSE: 142 mg/dL — AB (ref 70–99)
Potassium: 4.9 mmol/L (ref 3.5–5.1)
Sodium: 140 mmol/L (ref 135–145)

## 2017-08-19 LAB — MAGNESIUM: Magnesium: 2.1 mg/dL (ref 1.7–2.4)

## 2017-08-19 LAB — PHOSPHORUS: PHOSPHORUS: 2.8 mg/dL (ref 2.5–4.6)

## 2017-08-19 LAB — TRIGLYCERIDES: Triglycerides: 58 mg/dL (ref <150)

## 2017-08-19 NOTE — Progress Notes (Signed)
While I was in the patient's room, the significant other [name] was acting about weaning the patient again today. At this time the patient began to shake his head no. I asked the patient if he didn't want to wean again today. The patient shook his head yes. I asked the patient if he was too tired to try again and the patient shook his head yes again. I reassured the patient that we wouldn't do anything he didn't want and that it was completely his decision. Dr. Lake Bells made aware.

## 2017-08-19 NOTE — Progress Notes (Signed)
Nutrition Follow-up  DOCUMENTATION CODES:   Severe malnutrition in context of chronic illness, Underweight  INTERVENTION:   Continue Trickle TF of Vital AF 1.2 @ 10 ml/hr  Goal rate to meet nutritional needs: Vital AF 1.2 @ 60 ml/hr  NUTRITION DIAGNOSIS:   Severe Malnutrition related to (stage IV pancreatic cancer) as evidenced by severe fat depletion, severe muscle depletion.  Continues  GOAL:   Patient will meet greater than or equal to 90% of their needs  Not Met  MONITOR:   TF tolerance, Vent status, Skin, Weight trends, Labs  REASON FOR ASSESSMENT:   Consult, Ventilator Enteral/tube feeding initiation and management  ASSESSMENT:   57 yo male admitted with acute on chronic respiratory failure (on 2L home oxygen at baseline) with b/l pleural effusions and pneumonia, severe sepsis . Pt with pancreatic cancer followed at [facility]; first diagnosed in August of 2015 which progressed to stage IV in 2018 despite multiple interventions including pancreatectomy and splenectomy. Pt with additional hx of CKD, DM, hepatitis C, HTN   7/31 Thoracentesis, 1.1 L fluid removed 8/1 Failed SBT  Vital AF 1.2 infusing at 10 ml/hr  Pt with chronic malignant pleural effusions and pulmonary tumor burden in setting of stage IV pancreatic cancer with poor prognosis Discussed nutrition poc with MD. No plans to titrate TF at present time. Plan is for one more attempt at SBT, if he passes then plan to extubated. If does not pass SBT, plan for palliative extubation  Labs: reviewed Meds: reviewed  Diet Order:   Diet Order           Diet NPO time specified  Diet effective now          EDUCATION NEEDS:   Not appropriate for education at this time  Skin:  Skin Assessment: Skin Integrity Issues: Skin Integrity Issues:: Stage II Stage II: coccyx  Last BM:  7/25  Height:   Ht Readings from Last 1 Encounters:  08/01/2017 '5\' 8"'$  (1.727 m)    Weight:   Wt Readings from Last 1  Encounters:  08/19/17 109 lb 9.1 oz (49.7 kg)    Ideal Body Weight:  70 kg  BMI:  Body mass index is 16.66 kg/m.  Estimated Nutritional Needs:   Kcal:  1797 kcals   Protein:  94-115 g  Fluid:  >/= 1.7 L   Kerman Passey MS, RD, LDN, CNSC [phone] Pager  [phone] Weekend/On-Call Pager

## 2017-08-19 NOTE — Progress Notes (Signed)
PULMONARY / CRITICAL CARE MEDICINE   Name: Jerry Terrell MRN: [id number] DOB: 01/12/1961    ADMISSION DATE:  07/21/2017 CONSULTATION DATE:  7/29  REFERRING MD:  Dr. Kathrynn Humble EDP  CHIEF COMPLAINT:  Respiratory failure  BRIEF SUMMARY:  57 year old male with pancreatic cancer followed at [facility].  First diagnosed in August of 2015 as invasive adenocarcinoma of the pancreatic tail, then progressed by November of 2018 to stage IV despite multiple different treatment regimens as outlined in heme/onc note from 7/29, including initial pancreatectomy and splenectomy. He has been recommended Hospice care, but is reluctant. Currently undergoing a pause in treatment, but is being considered for a cycle of gemcitabine and abraxane. He has felt too weak to undergo further treatment. Feels he "needs to improve" before having any further.   7/29 he presented to [facility] ED with complaints of dyspnea and a syncopal episode. Upon arrival to the ED he was profoundly hypoxemic, ultimately requiring intubation and his mental status diminished from alert and oriented, to lethargic.  Imaging concerning for bilateral pleural effusions and possible consolidation. He was started on broad spectrum antibiotics for possible HCAP and PCCM was called for admission.    SUBJECTIVE: Awake only sedation now is low-dose fentanyl.  Propofol is been turned off.  VITAL SIGNS: BP (!) 82/65   Pulse 90   Temp (!) 97.3 F (36.3 C) (Core)   Resp 18   Ht 5\' 8"  (1.727 m)   Wt 109 lb 9.1 oz (49.7 kg)   SpO2 94%   BMI 16.66 kg/m   HEMODYNAMICS:    VENTILATOR SETTINGS: Vent Mode: PRVC FiO2 (%):  [40 %-80 %] 40 % Set Rate:  [16 bmp] 16 bmp Vt Set:  [500 mL] 500 mL PEEP:  [5 cmH20] 5 cmH20 Plateau Pressure:  [25 cmH20-32 cmH20] 28 cmH20  INTAKE / OUTPUT: I/O last 3 completed shifts: In: 4529.2 [I.V.:3630; NG/GT:300; IV Piggyback:599.2] Out: 1099 [Urine:1099]  PHYSICAL EXAMINATION: General: Frail cachectic male on full  mechanical dilatory support HEENT: Endotracheal tube to ventilator, NG tube with trickle tube feeds Neuro: Opens eyes but does not follow commands CV: Sounds are regular PULM:  decreased breath sounds bilaterally with coarse rhonchi RC:VELF, non-tender, bsx4 faint Extremities: warm/dry, negative edema  Skin: no rashes or lesions    LABS:  BMET Recent Labs  Lab 08/17/17 1252 08/17/17 1643 08/19/17 0354  NA 134* 135 140  K 5.3* 5.5* 4.9  CL 100 101 109  CO2 16* 16* 23  BUN 30* 31* 40*  CREATININE 1.50* 1.45* 1.38*  GLUCOSE 139* 142* 142*    Electrolytes Recent Labs  Lab 08/17/17 1252  08/17/17 1643 08/18/17 0428 08/18/17 1651 08/19/17 0354  CALCIUM 8.1*  --  8.3*  --   --  8.2*  MG  --    < > 2.1 2.2 2.0 2.1  PHOS  --    < > 3.9 4.1 2.6 2.8   < > = values in this interval not displayed.    CBC Recent Labs  Lab 07/31/2017 0608 08/17/17 0446  WBC 15.0* 13.2*  HGB 13.2 13.2  HCT 41.1 41.0  PLT 210 218    Coag's Recent Labs  Lab 07/28/2017 1455  INR 1.50    Sepsis Markers Recent Labs  Lab 08/02/2017 1455 08/18/17 0925 08/18/17 1806  LATICACIDVEN 4.4* 6.3* 3.9*  PROCALCITON 12.34  --   --     ABG Recent Labs  Lab 07/31/2017 0633 08/05/2017 0942  PHART 7.163* 7.476*  PCO2ART  87.0* 23.2*  PO2ART 80.0* 123.0*    Liver Enzymes Recent Labs  Lab 08/10/2017 0608  AST 49*  ALT 21  ALKPHOS 122  BILITOT 1.8*  ALBUMIN 2.3*    Cardiac Enzymes No results for input(s): TROPONINI, PROBNP in the last 168 hours.  Glucose Recent Labs  Lab 08/18/17 1130 08/18/17 1536 08/18/17 1930 08/18/17 2332 08/19/17 0357 08/19/17 0801  GLUCAP 124* 105* 120* 123* 133* 90    Imaging Dg Chest Port 1 View  Result Date: 08/19/2017 CLINICAL DATA:  Respiratory failure EXAM: PORTABLE CHEST 1 VIEW COMPARISON:  08/18/2017 FINDINGS: Cardiac shadow is within normal limits. Nasogastric catheter, endotracheal tube and left chest port are again seen and stable. Right-sided  pleural effusion is again identified. Patchy basilar infiltrates are again seen and stable. The right apical pneumothorax is somewhat obscured by overlying artifact but likely present. No new focal abnormality is seen. IMPRESSION: Stable appearance when compared with the prior exam. Electronically Signed   By: Inez Catalina M.D.   On: 08/19/2017 09:00   Dg Chest Portable 1 View  Result Date: 08/18/2017 CLINICAL DATA:  Follow-up right pneumothorax. EXAM: PORTABLE CHEST 1 VIEW COMPARISON:  Radiograph earlier this day.  Chest CT yesterday. FINDINGS: The endotracheal tube tip is 4.8 cm from the carina. Enteric tube in place with tip and side-port below the diaphragm. Left chest port remains unchanged with tip in the distal SVC. Small right apical pneumothorax is unchanged to minimally improved from prior exam. Bilateral pleural effusions, right greater than left, unchanged. Slight improvement in bilateral patchy opacities throughout both lungs. Unchanged heart size and mediastinal contours. No evidence of left pneumothorax. IMPRESSION: 1. Unchanged to slightly improved right apical pneumothorax. 2. Unchanged bilateral pleural effusions. Slight improvement in bilateral patchy opacities throughout both lungs. Electronically Signed   By: Jeb Levering M.D.   On: 08/18/2017 21:07   Dg Chest Portable 1 View  Result Date: 08/18/2017 CLINICAL DATA:  Followup thoracentesis EXAM: PORTABLE CHEST 1 VIEW COMPARISON:  08/17/2017 FINDINGS: Endotracheal tube tip is 7 cm above the carina. Nasogastric tube enters the abdomen. Power port has its tip at the SVC RA junction. Thoracentesis performed presumably on the right. There is less pleural fluid on the right. There is a small pneumothorax, 5% or less. No tension. Pulmonary edema is improved but does persist. IMPRESSION: Less pleural fluid on the right. Small right pneumothorax, 5% or less. Improved but some residual edema. Electronically Signed   By: Nelson Chimes M.D.   On:  08/18/2017 16:36    STUDIES:  Venous doppler 7/29 > CTA Chest, ABD/Pelvis 7/30 >> neg for PE, multifocal patchy opacities, RLL atelectasis, suspected underlying RLL mass, left supraclavicular nodal metastasis.  S/P pancreatectomy, splenectomy, cholecystectomy and suspected partial gastrectomy with gastrojejunostomy, widespread hepatic metastases, upper abdominal metastases, tumor involvement of left kidney, ureter with indwelling stent, & sclerotic mets at L2  CULTURES: Blood 7/29 > Sputum 7/29 > not done 08/18/2017 pleural fluid>>  ANTIBIOTICS: Cefepime 7/29 > Vancomycin 7/29 >off 08/04/2017 Flagyl>>  SIGNIFICANT EVENTS: 7/29 admit for respiratory failure, intubated.  7/30/20019 lower extremity Doppler studies are negative  LINES/TUBES: ETT 7/29 >  DISCUSSION: 57 year old male with stage IV pancreatic Ca initially diagnosed in 2015. Chemo currently on hold due to weakness. Deteriorating condition over the past several months. Now admitted with multifactorial respiratory failure. Intubated in ED. Family understands prognosis is poor. Being treated for HCAP conservatively. DNR, no pressors. Family wants to give him few days of conservative treatment to  see if he can be liberated from vent. If not they would opt for comfort measures.  08/18/2017 no significant change.    ASSESSMENT / PLAN:  Acute on chronic hypoxemic respiratory failure (2L [place] at home): multifactorial in the setting of bilateral pleural effusions (R>L) and likely pneumonia. 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 Family unclear if malignant fluid. PE ruled out.  08/19/2017 2 episodes of hypoxia and desaturations during the night required bagging lavage. Pneumothorax P:  Vent bundle FiO2 as needed Status post thoracentesis with 1.2 L obtained on 08/18/2017 Follow chest x-ray for pneumothorax Prognosis appears poor  Severe Sepsis: HCAP. BP improved with volume.   P: Currently on cefepime day 2 Flagyl day 3 Post thoracentesis 08/18/2017 with culture data pending   Metabolic acidosis: lactic, may not completely clear due to metastatic renal/hepatic involvement P: Lactic acid 3.9 08/18/2017  Pancreatic adenocarcinoma: treated at [facility]. Treatment currently on hold. S/P pancreatectomy and splenectomy. Extensive metastatic disease on CT 7/30. P: Poor prognosis Fianc wants aggressive care at this time  AKI worsening Hyponatremia resolved Hyperkalemia Lab Results  Component Value Date   CREATININE 1.38 (H) 08/19/2017   CREATININE 1.45 (H) 08/17/2017   CREATININE 1.50 (H) 08/17/2017   CREATININE 1.14 11/17/2012   CREATININE 1.20 02/25/2012   CREATININE 1.08 12/02/2011     Recent Labs  Lab 08/17/17 1252 08/17/17 1643 08/19/17 0354  NA 134* 135 140   Recent Labs  Lab 08/17/17 1252 08/17/17 1643 08/19/17 0354  K 5.3* 5.5* 4.9    P: Hyperkalemia resolved Monitor electrolytes Avoid supplemental potassium Renal function and is declining as of 01/19/1550  Acute metabolic encephalopathy Minimize sedation as tolerated More awake 08/19/2017  Hepatitis C: LFT essentially wnl. Tbili 1.8  Diabetes Mellitus CBG (last 3)  Recent Labs    08/18/17 2332 08/19/17 0357 08/19/17 0801  GLUCAP 123* 133* 90    P: CBG with SSI   Severe Protein Calorie Malnutrition  P: Trickle tube feeds, no increase at this time    Diet: Trickle tube feeds VTE ppx: Heparin SQ Dispo: ICU Code: DNR, no escalation. Short term vent.   Family: Fiance is HCPOA. Some family supposedly unstable.  08/19/2017 fianc updated at bedside. CC Time: 30 minutes   Richardson Landry Kron Everton ACNP Maryanna Shape PCCM Pager [phone] till 1 pm If no answer page [phone] 08/19/2017, 9:34 AM

## 2017-08-19 NOTE — Progress Notes (Signed)
RT attempted to change pt cloth tape on ETT. Patient agitated, RT will attempt again at a later time. No breakdown noted. RN aware. RT will continue to monitor.

## 2017-08-19 DEATH — deceased

## 2017-08-20 ENCOUNTER — Inpatient Hospital Stay (HOSPITAL_COMMUNITY)

## 2017-08-20 DIAGNOSIS — J9602 Acute respiratory failure with hypercapnia: Secondary | ICD-10-CM | POA: Diagnosis not present

## 2017-08-20 DIAGNOSIS — J939 Pneumothorax, unspecified: Secondary | ICD-10-CM | POA: Diagnosis not present

## 2017-08-20 DIAGNOSIS — J189 Pneumonia, unspecified organism: Secondary | ICD-10-CM | POA: Diagnosis not present

## 2017-08-20 DIAGNOSIS — J9601 Acute respiratory failure with hypoxia: Secondary | ICD-10-CM | POA: Diagnosis not present

## 2017-08-20 LAB — BASIC METABOLIC PANEL
ANION GAP: 13 (ref 5–15)
BUN: 43 mg/dL — ABNORMAL HIGH (ref 6–20)
CALCIUM: 8.2 mg/dL — AB (ref 8.9–10.3)
CO2: 17 mmol/L — ABNORMAL LOW (ref 22–32)
Chloride: 112 mmol/L — ABNORMAL HIGH (ref 98–111)
Creatinine, Ser: 1.34 mg/dL — ABNORMAL HIGH (ref 0.61–1.24)
GFR calc Af Amer: >60 mL/min (ref >60)
GFR, EST NON AFRICAN AMERICAN: 56 mL/min — AB (ref >60)
GLUCOSE: 144 mg/dL — AB (ref 70–99)
POTASSIUM: 4.9 mmol/L (ref 3.5–5.1)
SODIUM: 142 mmol/L (ref 135–145)

## 2017-08-20 LAB — GLUCOSE, CAPILLARY
GLUCOSE-CAPILLARY: 130 mg/dL — AB (ref 70–99)
GLUCOSE-CAPILLARY: 109 mg/dL — AB (ref 70–99)
GLUCOSE-CAPILLARY: 142 mg/dL — AB (ref 70–99)
Glucose-Capillary: 103 mg/dL — ABNORMAL HIGH (ref 70–99)

## 2017-08-20 LAB — PHOSPHORUS: Phosphorus: 2.2 mg/dL — ABNORMAL LOW (ref 2.5–4.6)

## 2017-08-20 LAB — MAGNESIUM: MAGNESIUM: 2.2 mg/dL (ref 1.7–2.4)

## 2017-08-20 MED ORDER — SODIUM CHLORIDE 0.9 % IV SOLN
10.0000 mg/h | INTRAVENOUS | Status: DC
  Administered 2017-08-20: 10 mg/h via INTRAVENOUS
  Filled 2017-08-20: qty 10

## 2017-08-20 MED ORDER — FENTANYL BOLUS VIA INFUSION
50.0000 ug | INTRAVENOUS | Status: DC | PRN
  Filled 2017-08-20: qty 200

## 2017-08-20 MED ORDER — MIDAZOLAM BOLUS VIA INFUSION (WITHDRAWAL LIFE SUSTAINING TX)
5.0000 mg | INTRAVENOUS | Status: DC | PRN
  Administered 2017-08-20: 10 mg via INTRAVENOUS
  Filled 2017-08-20: qty 20

## 2017-08-20 MED ORDER — FENTANYL 2500MCG IN NS 250ML (10MCG/ML) PREMIX INFUSION
100.0000 ug/h | INTRAVENOUS | Status: DC
  Administered 2017-08-20: 200 ug/h via INTRAVENOUS

## 2017-08-21 LAB — CULTURE, BLOOD (ROUTINE X 2)
Culture: NO GROWTH
Culture: NO GROWTH
Special Requests: ADEQUATE

## 2017-08-21 LAB — CULTURE, RESPIRATORY W GRAM STAIN

## 2017-08-21 LAB — CULTURE, RESPIRATORY

## 2017-08-22 LAB — BODY FLUID CULTURE: CULTURE: NO GROWTH

## 2017-08-24 ENCOUNTER — Telehealth: Payer: Self-pay

## 2017-08-24 NOTE — Telephone Encounter (Signed)
On 08/24/17 I received a d/c from [employer]. The d/c is for cremation.  The patient is a patient of Doctor McQuaid.   The d/c will be taken to Pulmonary Unit for signature.

## 2017-08-25 ENCOUNTER — Telehealth: Payer: Self-pay

## 2017-08-25 ENCOUNTER — Telehealth: Payer: Self-pay | Admitting: Pulmonary Disease

## 2017-08-25 NOTE — Telephone Encounter (Signed)
On 08/25/17 I received the signed D/C back from Dr. Lake Bells. I got the D/C ready and faxed a copy to the funeral home per request the funeral home request.

## 2017-09-19 NOTE — Progress Notes (Signed)
47ml Fentanyl 68mcg/ml and 62ml Versed 1mg /ml  IV wasted in the sink. Witness: Linford Arnold, Therapist, sports.

## 2017-09-19 NOTE — Progress Notes (Addendum)
Ring removed from patients left ringer finger due to edema per fiance request by RN Mindy. Ring given to fiance.

## 2017-09-19 NOTE — Progress Notes (Signed)
Pt pronounced dead by 2 RN's without heart sounds for 1 full minute by Latricia Heft Rn and [place] RN - Many family members at bedside

## 2017-09-19 NOTE — Progress Notes (Signed)
PULMONARY / CRITICAL CARE MEDICINE   Name: Jerry Terrell MRN: [id number] DOB: [birth year]    ADMISSION DATE:  07/21/2017 CONSULTATION DATE:  7/29  REFERRING MD:  Dr. Kathrynn Humble EDP  CHIEF COMPLAINT:  Respiratory failure  BRIEF SUMMARY:  57 year old male with pancreatic cancer followed at [facility].  First diagnosed in August of 2015 as invasive adenocarcinoma of the pancreatic tail, then progressed by November of 2018 to stage IV despite multiple different treatment regimens as outlined in heme/onc note from 7/29, including initial pancreatectomy and splenectomy. He has been recommended Hospice care, but is reluctant. Currently undergoing a pause in treatment, but is being considered for a cycle of gemcitabine and abraxane. He has felt too weak to undergo further treatment. Feels he "needs to improve" before having any further.   7/29 he presented to [facility] ED with complaints of dyspnea and a syncopal episode. Upon arrival to the ED he was profoundly hypoxemic, ultimately requiring intubation and his mental status diminished from alert and oriented, to lethargic.  Imaging concerning for bilateral pleural effusions and possible consolidation. He was started on broad spectrum antibiotics for possible HCAP and PCCM was called for admission.    SUBJECTIVE: Patient and family are ready for withdrawal care in the near future.  VITAL SIGNS: BP 131/90   Pulse (!) 105   Temp 98.6 F (37 C)   Resp (!) 25   Ht 5\' 8"  (1.727 m)   Wt 114 lb 3.2 oz (51.8 kg)   SpO2 98%   BMI 17.36 kg/m   HEMODYNAMICS:    VENTILATOR SETTINGS: Vent Mode: PRVC FiO2 (%):  [40 %] 40 % Set Rate:  [16 bmp] 16 bmp Vt Set:  [500 mL] 500 mL PEEP:  [5 cmH20] 5 cmH20 Plateau Pressure:  [28 [id number] cmH20] 31 cmH20  INTAKE / OUTPUT: I/O last 3 completed shifts: In: 4417.1 [I.V.:3547.3; NG/GT:290; IV Piggyback:579.8] Out: 1478 [Urine:1214]  PHYSICAL EXAMINATION: General: Frail cachectic male who arouses HEENT:  Tracheal tube in place Neuro: Awake at times and follows commands CV: s1s2 rrr, no m/r/g PULM: even/non-labored, lungs bilaterally managed GI: Bowel Extremities: Wasted musculature Skin: no rashes or lesions     LABS:  BMET Recent Labs  Lab 08/17/17 1643 08/19/17 0354 2017-09-17 0444  NA 135 140 142  K 5.5* 4.9 4.9  CL 101 109 112*  CO2 16* 23 17*  BUN 31* 40* 43*  CREATININE 1.45* 1.38* 1.34*  GLUCOSE 142* 142* 144*    Electrolytes Recent Labs  Lab 08/17/17 1643  08/18/17 1651 08/19/17 0354 09-17-2017 0444  CALCIUM 8.3*  --   --  8.2* 8.2*  MG 2.1   < > 2.0 2.1 2.2  PHOS 3.9   < > 2.6 2.8 2.2*   < > = values in this interval not displayed.    CBC Recent Labs  Lab 08/06/2017 0608 08/17/17 0446  WBC 15.0* 13.2*  HGB 13.2 13.2  HCT 41.1 41.0  PLT 210 218    Coag's Recent Labs  Lab 08/02/2017 1455  INR 1.50    Sepsis Markers Recent Labs  Lab 08/15/2017 1455 08/18/17 0925 08/18/17 1806  LATICACIDVEN 4.4* 6.3* 3.9*  PROCALCITON 12.34  --   --     ABG Recent Labs  Lab 08/17/2017 0633 08/01/2017 0942  PHART 7.163* 7.476*  PCO2ART 87.0* 23.2*  PO2ART 80.0* 123.0*    Liver Enzymes Recent Labs  Lab 07/27/2017 0608  AST 49*  ALT 21  ALKPHOS 122  BILITOT 1.8*  ALBUMIN 2.3*    Cardiac Enzymes No results for input(s): TROPONINI, PROBNP in the last 168 hours.  Glucose Recent Labs  Lab 08/19/17 1206 08/19/17 1605 08/19/17 2022 08/19/17 2348 2017/09/18 0422 2017/09/18 0818  GLUCAP 117* 126* 95 78 130* 103*    Imaging Dg Chest Port 1 View  Result Date: September 18, 2017 CLINICAL DATA:  Respiratory failure. EXAM: PORTABLE CHEST 1 VIEW COMPARISON:  One-view chest x-ray 08/19/2017. FINDINGS: Endotracheal tube terminates at the clavicles. Left subclavian Port-A-Cath is stable. Heart size is normal. Bilateral pleural effusions and airspace disease are similar the prior exam IMPRESSION: 1. Support apparatus is stable. 2. Bilateral effusions and airspace disease  are stable. Electronically Signed   By: San Morelle M.D.   On: 18-Sep-2017 07:34    STUDIES:  Venous doppler 7/29 > CTA Chest, ABD/Pelvis 7/30 >> neg for PE, multifocal patchy opacities, RLL atelectasis, suspected underlying RLL mass, left supraclavicular nodal metastasis.  S/P pancreatectomy, splenectomy, cholecystectomy and suspected partial gastrectomy with gastrojejunostomy, widespread hepatic metastases, upper abdominal metastases, tumor involvement of left kidney, ureter with indwelling stent, & sclerotic mets at L2  CULTURES: Blood 7/29 > Sputum 7/29 > not done 08/18/2017 pleural fluid>>  ANTIBIOTICS: Cefepime 7/29 > Vancomycin 7/29 >off 08/08/2017 Flagyl>>  SIGNIFICANT EVENTS: 7/29 admit for respiratory failure, intubated.  7/30/20019 lower extremity Doppler studies are negative  LINES/TUBES: ETT 7/29 >  DISCUSSION: 57 year old male with stage IV pancreatic Ca initially diagnosed in 2015. Chemo currently on hold due to weakness. Deteriorating condition over the past several months. Now admitted with multifactorial respiratory failure. Intubated in ED. Family understands prognosis is poor. Being treated for HCAP conservatively. DNR, no pressors. Family wants to give him few days of conservative treatment to see if he can be liberated from vent. If not they would opt for comfort measures.  08/18/2017 no significant change.    ASSESSMENT / PLAN:  Acute on chronic hypoxemic respiratory failure (2L [place] at home): multifactorial in the setting of bilateral pleural effusions (R>L) and likely pneumonia. 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 Family unclear if malignant fluid. PE ruled out.  08/19/2017 2 episodes of hypoxia and desaturations during the night required bagging lavage. Pneumothorax P:  Plan for comfort care one-way extubation in the near future  Severe Sepsis: HCAP. BP improved with volume.  P: [place] point in the  setting of withdrawal care   Metabolic acidosis: lactic, may not completely clear due to metastatic renal/hepatic involvement P: [place] point  Pancreatic adenocarcinoma: treated at [facility]. Treatment currently on hold. S/P pancreatectomy and splenectomy. Extensive metastatic disease on CT 7/30. P: Poor prognosis Family is coming to grips with withdrawal of care on Sep 18, 2017  AKI worsening Hyponatremia resolved Hyperkalemia Lab Results  Component Value Date   CREATININE 1.34 (H) 09-18-17   CREATININE 1.38 (H) 08/19/2017   CREATININE 1.45 (H) 08/17/2017   CREATININE 1.14 11/17/2012   CREATININE 1.20 02/25/2012   CREATININE 1.08 12/02/2011     Recent Labs  Lab 08/17/17 1643 08/19/17 0354 09/18/2017 0444  NA 135 140 142   Recent Labs  Lab 08/17/17 1643 08/19/17 0354 2017/09/18 0444  K 5.5* 4.9 4.9    P: [place] point as he is approaching withdrawal of care  Acute metabolic encephalopathy This is resolved. Will start morphine drip appropriate time for terminal wean and extubation.  Hepatitis C: LFT essentially wnl. Tbili 1.8  Diabetes Mellitus CBG (last 3)  Recent Labs    08/19/17  2348 09-19-2017 0422 19-Sep-2017 0818  GLUCAP 78 130* 103*    P: CBG with SSI   Severe Protein Calorie Malnutrition  P: Trickle tube feeds are removed point at this time    Diet: Trickle tube feeds VTE ppx: Heparin SQ Dispo: ICU Code: Full DNR, 09-19-17 fianc is ready for withdrawal of care in the near future.  Family: Fiance is HCPOA. Some family supposedly unstable.  08/19/2017 fianc updated at bedside. CC Time: 30 minutes   Richardson Landry Minor ACNP Maryanna Shape PCCM Pager [phone] till 1 pm If no answer page [phone] 09/19/17, 10:48 AM

## 2017-09-19 NOTE — Progress Notes (Signed)
Patient terminally extubated. Family at bedside. RT will continue to monitor.

## 2017-09-19 NOTE — Progress Notes (Signed)
Extubated by Rt with RN Present with many family members present. Pt on Fentanyl and versed gtt for comfort.

## 2017-09-19 NOTE — Death Summary Note (Signed)
DEATH SUMMARY   Patient Details  Name: Jerry Terrell MRN: [id number] DOB: 1960-06-11  Admission/Discharge Information   Admit Date:  August 19, 2017  Date of Death: Date of Death: 08/23/2017  Time of Death: Time of Death: Apr 18, 1906  Length of Stay: 4  Referring Physician: Berdie Ogren, MD   Reason(s) for Hospitalization  Respiratory faiulre  Diagnoses  Preliminary cause of death:   Pancreatic cancer Secondary Diagnoses (including complications and co-morbidities):  Active Problems:   HCAP (healthcare-associated pneumonia)   Pressure injury of skin   Acute respiratory failure with hypoxia and hypercarbia (HCC)   Septic shock (HCC)   Protein-calorie malnutrition, severe   Pleural effusion   Pneumothorax on right   Brief Hospital Course (including significant findings, care, treatment, and services provided and events leading to death)  Jerry Terrell is a 57 y.o. year old male who had advanced metastatic cancer and was admitted to [facility] for worsening respiratory failure in the setting of an enlarging right-sided pleural effusion.  He had been treated with chemotherapy by an outside hospital and recently hospice had been recommended because of progression despite treatment.  He had known pulmonary metastasis.  He was admitted with respiratory failure requiring intubation.  A large right-sided pleural effusion was drained with a thoracentesis but this did not improve his respiratory status.  In consultation with the patient's family we withdrew care under full comfort measures and he died peacefully with family at the bedside.    Pertinent Labs and Studies  Significant Diagnostic Studies Ct Angio Chest Pe W Or Wo Contrast  Result Date: 08/17/2017 CLINICAL DATA:  Pancreatic cancer. Respiratory failure with shortness of breath, bilateral pleural effusions, and probable pneumonia. Intubated. EXAM: CT ANGIOGRAPHY CHEST CT ABDOMEN AND PELVIS WITH CONTRAST TECHNIQUE: Multidetector  CT imaging of the chest was performed using the standard protocol during bolus administration of intravenous contrast. Multiplanar CT image reconstructions and MIPs were obtained to evaluate the vascular anatomy. Multidetector CT imaging of the abdomen and pelvis was performed using the standard protocol during bolus administration of intravenous contrast. CONTRAST:  143mL ISOVUE-370 IOPAMIDOL (ISOVUE-370) INJECTION 76% COMPARISON:  CTA chest dated 08/28/2014. CT abdomen/pelvis dated 07/07/2013. FINDINGS: CTA CHEST FINDINGS Cardiovascular: Satisfactory opacification of the bilateral pulmonary artery to the lobar level. No evidence of pulmonary embolism. No evidence of thoracic aortic aneurysm or dissection. The heart is normal in size.  No pericardial effusion. Left chest port terminates at the cavoatrial junction. Mediastinum/Nodes: Due to extreme early arterial phase, there is poor soft tissue contrast of the mediastinum. As such, nodal status cannot be assessed. However, there is a 2.9 cm short axis left supraclavicular nodal mass (series 9/image 5). Thyroid is incompletely evaluated but grossly unremarkable. Lungs/Pleura: Endotracheal tube terminates at the thoracic inlet, 7 cm above the carina. Large bilateral pleural effusions. Suspected complexity on the right (series 9/image 70), suspicious for malignant pleural effusion. Suspected pleural-based metastases overlying the bilateral hemidiaphragms, better evaluated on the CT abdomen/pelvis study (series 7/images 11 and 24). Compressive atelectasis in the right middle lobe and left lower lobe. Possible compressive atelectasis of the right lower lobe, although underlying mass is suspected (series 9/image 90), worrisome for tumor. Additional multifocal patchy and ground-glass opacities in the bilateral upper lobes, right middle lobe, and superior segment left lower lobe, favoring multifocal pneumonia over interstitial edema. Technically speaking, some of this  appearance could also reflect tumor (for example, series 10/image 35 in the left upper lobe), although this is considered less likely given the overall  patchy appearance. No pneumothorax. Musculoskeletal: Visualized osseous structures are within normal limits. Review of the MIP images confirms the above findings. CT ABDOMEN and PELVIS FINDINGS Limited evaluation due to poor soft tissue contrast in the abdomen/pelvis. Hepatobiliary: Innumerable lesions throughout the liver, measuring up to 9 mm (series 7/image 21), suspicious for metastases. Status post cholecystectomy. No intrahepatic or extrahepatic ductal dilatation. Pancreas: Not visualized, likely surgically absent. Spleen: Surgically absent. Adrenals/Urinary Tract: Right adrenal gland is grossly unremarkable. Left adrenal gland is not discretely visualized. Right kidney is unremarkable. Infiltrating mass in the left kidney and extending into the left renal sinus, measuring approximately 8.9 x 4.9 cm. Indwelling left double-pigtail ureteral stent. Bladder is notable for an indwelling Foley catheter and nondependent gas. Stomach/Bowel: Suspected distal gastrectomy with gastrojejunostomy. Enteric tube terminates in jejunum. No evidence of bowel obstruction. Large volume colonic stool burden. Vascular/Lymphatic: No evidence of abdominal aortic aneurysm. Atherosclerotic calcifications of the abdominal aorta and branch vessels. 1.7 cm short axis right retrocrural node (series 7/image 13). 2.1 cm short axis gastrohepatic node (series 7/image 21). 5.8 x 3.9 cm nodal mass in the left para-aortic region along the course of the ureteral stent (series 7/image 33). Reproductive: Prostate is grossly unremarkable. Other: No definite abdominopelvic ascites, although evaluation is difficult due to poor soft tissue contrast. Musculoskeletal: Low density involving the left psoas muscle (relative to the right; series 7/image 41) may reflect fatty atrophy/denervation injury,  although additional tumor involvement is not excluded. Sclerotic metastasis along the posterior aspect of the L2 vertebral body (sagittal image 68). Review of the MIP images confirms the above findings. IMPRESSION: No evidence of pulmonary embolism. Multifocal patchy pulmonary opacities, favoring multifocal pneumonia, less likely interstitial edema. Associated large bilateral pleural effusions, likely malignant, with associated pleural-based metastases. Right lower lobe atelectasis/collapse with suspected underlying right lower lobe mass. Left supraclavicular nodal metastasis. Status post pancreatectomy, splenectomy, cholecystectomy, and suspected partial gastrectomy with gastrojejunostomy. Widespread hepatic metastases. Retrocrural, upper abdominal, and para-aortic nodal metastases. Tumor involvement of the left kidney and ureter, with indwelling ureteral stent. Sclerotic metastasis at L2. Electronically Signed   By: Julian Hy M.D.   On: 08/17/2017 01:25   Ct Abdomen Pelvis W Contrast  Result Date: 08/17/2017 CLINICAL DATA:  Pancreatic cancer. Respiratory failure with shortness of breath, bilateral pleural effusions, and probable pneumonia. Intubated. EXAM: CT ANGIOGRAPHY CHEST CT ABDOMEN AND PELVIS WITH CONTRAST TECHNIQUE: Multidetector CT imaging of the chest was performed using the standard protocol during bolus administration of intravenous contrast. Multiplanar CT image reconstructions and MIPs were obtained to evaluate the vascular anatomy. Multidetector CT imaging of the abdomen and pelvis was performed using the standard protocol during bolus administration of intravenous contrast. CONTRAST:  179mL ISOVUE-370 IOPAMIDOL (ISOVUE-370) INJECTION 76% COMPARISON:  CTA chest dated 08/28/2014. CT abdomen/pelvis dated 07/07/2013. FINDINGS: CTA CHEST FINDINGS Cardiovascular: Satisfactory opacification of the bilateral pulmonary artery to the lobar level. No evidence of pulmonary embolism. No evidence of  thoracic aortic aneurysm or dissection. The heart is normal in size.  No pericardial effusion. Left chest port terminates at the cavoatrial junction. Mediastinum/Nodes: Due to extreme early arterial phase, there is poor soft tissue contrast of the mediastinum. As such, nodal status cannot be assessed. However, there is a 2.9 cm short axis left supraclavicular nodal mass (series 9/image 5). Thyroid is incompletely evaluated but grossly unremarkable. Lungs/Pleura: Endotracheal tube terminates at the thoracic inlet, 7 cm above the carina. Large bilateral pleural effusions. Suspected complexity on the right (series 9/image 70), suspicious for malignant pleural effusion.  Suspected pleural-based metastases overlying the bilateral hemidiaphragms, better evaluated on the CT abdomen/pelvis study (series 7/images 11 and 24). Compressive atelectasis in the right middle lobe and left lower lobe. Possible compressive atelectasis of the right lower lobe, although underlying mass is suspected (series 9/image 90), worrisome for tumor. Additional multifocal patchy and ground-glass opacities in the bilateral upper lobes, right middle lobe, and superior segment left lower lobe, favoring multifocal pneumonia over interstitial edema. Technically speaking, some of this appearance could also reflect tumor (for example, series 10/image 35 in the left upper lobe), although this is considered less likely given the overall patchy appearance. No pneumothorax. Musculoskeletal: Visualized osseous structures are within normal limits. Review of the MIP images confirms the above findings. CT ABDOMEN and PELVIS FINDINGS Limited evaluation due to poor soft tissue contrast in the abdomen/pelvis. Hepatobiliary: Innumerable lesions throughout the liver, measuring up to 9 mm (series 7/image 21), suspicious for metastases. Status post cholecystectomy. No intrahepatic or extrahepatic ductal dilatation. Pancreas: Not visualized, likely surgically absent.  Spleen: Surgically absent. Adrenals/Urinary Tract: Right adrenal gland is grossly unremarkable. Left adrenal gland is not discretely visualized. Right kidney is unremarkable. Infiltrating mass in the left kidney and extending into the left renal sinus, measuring approximately 8.9 x 4.9 cm. Indwelling left double-pigtail ureteral stent. Bladder is notable for an indwelling Foley catheter and nondependent gas. Stomach/Bowel: Suspected distal gastrectomy with gastrojejunostomy. Enteric tube terminates in jejunum. No evidence of bowel obstruction. Large volume colonic stool burden. Vascular/Lymphatic: No evidence of abdominal aortic aneurysm. Atherosclerotic calcifications of the abdominal aorta and branch vessels. 1.7 cm short axis right retrocrural node (series 7/image 13). 2.1 cm short axis gastrohepatic node (series 7/image 21). 5.8 x 3.9 cm nodal mass in the left para-aortic region along the course of the ureteral stent (series 7/image 33). Reproductive: Prostate is grossly unremarkable. Other: No definite abdominopelvic ascites, although evaluation is difficult due to poor soft tissue contrast. Musculoskeletal: Low density involving the left psoas muscle (relative to the right; series 7/image 41) may reflect fatty atrophy/denervation injury, although additional tumor involvement is not excluded. Sclerotic metastasis along the posterior aspect of the L2 vertebral body (sagittal image 68). Review of the MIP images confirms the above findings. IMPRESSION: No evidence of pulmonary embolism. Multifocal patchy pulmonary opacities, favoring multifocal pneumonia, less likely interstitial edema. Associated large bilateral pleural effusions, likely malignant, with associated pleural-based metastases. Right lower lobe atelectasis/collapse with suspected underlying right lower lobe mass. Left supraclavicular nodal metastasis. Status post pancreatectomy, splenectomy, cholecystectomy, and suspected partial gastrectomy with  gastrojejunostomy. Widespread hepatic metastases. Retrocrural, upper abdominal, and para-aortic nodal metastases. Tumor involvement of the left kidney and ureter, with indwelling ureteral stent. Sclerotic metastasis at L2. Electronically Signed   By: Julian Hy M.D.   On: 08/17/2017 01:25   Dg Chest Port 1 View  Result Date: September 18, 2017 CLINICAL DATA:  Respiratory failure. EXAM: PORTABLE CHEST 1 VIEW COMPARISON:  One-view chest x-ray 08/19/2017. FINDINGS: Endotracheal tube terminates at the clavicles. Left subclavian Port-A-Cath is stable. Heart size is normal. Bilateral pleural effusions and airspace disease are similar the prior exam IMPRESSION: 1. Support apparatus is stable. 2. Bilateral effusions and airspace disease are stable. Electronically Signed   By: San Morelle M.D.   On: September 18, 2017 07:34   Dg Chest Port 1 View  Result Date: 08/19/2017 CLINICAL DATA:  Respiratory failure EXAM: PORTABLE CHEST 1 VIEW COMPARISON:  08/18/2017 FINDINGS: Cardiac shadow is within normal limits. Nasogastric catheter, endotracheal tube and left chest port are again seen and stable. Right-sided  pleural effusion is again identified. Patchy basilar infiltrates are again seen and stable. The right apical pneumothorax is somewhat obscured by overlying artifact but likely present. No new focal abnormality is seen. IMPRESSION: Stable appearance when compared with the prior exam. Electronically Signed   By: Inez Catalina M.D.   On: 08/19/2017 09:00   Dg Chest Portable 1 View  Result Date: 08/18/2017 CLINICAL DATA:  Follow-up right pneumothorax. EXAM: PORTABLE CHEST 1 VIEW COMPARISON:  Radiograph earlier this day.  Chest CT yesterday. FINDINGS: The endotracheal tube tip is 4.8 cm from the carina. Enteric tube in place with tip and side-port below the diaphragm. Left chest port remains unchanged with tip in the distal SVC. Small right apical pneumothorax is unchanged to minimally improved from prior exam. Bilateral  pleural effusions, right greater than left, unchanged. Slight improvement in bilateral patchy opacities throughout both lungs. Unchanged heart size and mediastinal contours. No evidence of left pneumothorax. IMPRESSION: 1. Unchanged to slightly improved right apical pneumothorax. 2. Unchanged bilateral pleural effusions. Slight improvement in bilateral patchy opacities throughout both lungs. Electronically Signed   By: Jeb Levering M.D.   On: 08/18/2017 21:07   Dg Chest Portable 1 View  Result Date: 08/18/2017 CLINICAL DATA:  Followup thoracentesis EXAM: PORTABLE CHEST 1 VIEW COMPARISON:  08/17/2017 FINDINGS: Endotracheal tube tip is 7 cm above the carina. Nasogastric tube enters the abdomen. Power port has its tip at the SVC RA junction. Thoracentesis performed presumably on the right. There is less pleural fluid on the right. There is a small pneumothorax, 5% or less. No tension. Pulmonary edema is improved but does persist. IMPRESSION: Less pleural fluid on the right. Small right pneumothorax, 5% or less. Improved but some residual edema. Electronically Signed   By: Nelson Chimes M.D.   On: 08/18/2017 16:36   Dg Chest Port 1 View  Result Date: 08/17/2017 CLINICAL DATA:  Follow-up pleural effusion EXAM: PORTABLE CHEST 1 VIEW COMPARISON:  CT from earlier in the same day. FINDINGS: Cardiac shadow is stable. Endotracheal tube, nasogastric catheter and left chest wall port are again seen and stable. Large right-sided pleural effusion is noted. The large left-sided pleural effusion is not as well appreciated on this more upright image. Patchy infiltrative changes are noted throughout both lung similar to that seen on recent chest CT. IMPRESSION: Stable bilateral infiltrates as well as large right-sided pleural effusion. The left effusion is not as well appreciated on today's exam. Electronically Signed   By: Inez Catalina M.D.   On: 08/17/2017 07:44   Dg Chest Portable 1 View  Result Date:  07/31/2017 CLINICAL DATA:  Endotracheal tube placement. EXAM: PORTABLE CHEST 1 VIEW COMPARISON:  08/11/2017 at 0547 hours FINDINGS: Endotracheal tube terminates at the clavicles, 6.5 cm above the carina. Left subclavian Port-A-Cath terminates near the superior cavoatrial junction. Enteric tube courses into the left upper abdomen with tip not imaged. Bilateral airspace consolidation and right larger than left pleural effusions are unchanged. No pneumothorax is identified. IMPRESSION: 1. Interval endotracheal tube placement as above. 2. Unchanged bilateral airspace disease and pleural effusions. Electronically Signed   By: Logan Bores M.D.   On: 07/22/2017 07:19   Dg Chest Portable 1 View  Result Date: 07/29/2017 CLINICAL DATA:  Shortness of breath. Hypoxic. History of gastric cancer. EXAM: PORTABLE CHEST 1 VIEW COMPARISON:  Chest radiograph September 30, 2014 FINDINGS: Cardiac silhouette is normal in size. Fullness of the hila with perihilar consolidation. Large RIGHT greater than LEFT pleural effusions. Trachea projects midline. Dual  lumen LEFT chest Port-A-Cath distal tip projects in distal superior vena cava. No pneumothorax. Catheter projecting LEFT upper quadrant. IMPRESSION: RIGHT greater LEFT pleural effusions with multifocal dense consolidation. Fullness of the hila, potential lymphadenopathy. Electronically Signed   By: Elon Alas M.D.   On: 08/13/2017 06:25    Microbiology Recent Results (from the past 240 hour(s))  Blood Culture (routine x 2)     Status: None   Collection Time: 08/17/2017  6:54 AM  Result Value Ref Range Status   Specimen Description BLOOD RIGHT ANTECUBITAL  Final   Special Requests   Final    BOTTLES DRAWN AEROBIC ONLY Blood Culture results may not be optimal due to an inadequate volume of blood received in culture bottles   Culture   Final    NO GROWTH 5 DAYS Performed at [facility], [place] [address], [place], [place] [address]    Report Status 08/21/2017  FINAL  Final  Blood Culture (routine x 2)     Status: None   Collection Time: 08/07/2017  6:54 AM  Result Value Ref Range Status   Specimen Description BLOOD RIGHT ANTECUBITAL  Final   Special Requests   Final    BOTTLES DRAWN AEROBIC AND ANAEROBIC Blood Culture adequate volume   Culture   Final    NO GROWTH 5 DAYS Performed at [facility], [place] [address] [place], [place] [address]    Report Status 08/21/2017 FINAL  Final  MRSA PCR Screening     Status: None   Collection Time: 08/05/2017 11:38 AM  Result Value Ref Range Status   MRSA by PCR NEGATIVE NEGATIVE Final    Comment:        The GeneXpert MRSA Assay (FDA approved for NASAL specimens only), is one component of a comprehensive MRSA colonization surveillance program. It is not intended to diagnose MRSA infection nor to guide or monitor treatment for MRSA infections. Performed at [facility], [place] [address] [place], [place] [address]   Body fluid culture     Status: None   Collection Time: 08/18/17  4:20 PM  Result Value Ref Range Status   Specimen Description FLUID RIGHT PLEURAL  Final   Special Requests NONE  Final   Gram Stain   Final    FEW WBC PRESENT,BOTH PMN AND MONONUCLEAR NO ORGANISMS SEEN    Culture   Final    NO GROWTH 3 DAYS Performed at [facility], [place] [address], [place], [place] [address]    Report Status 08/22/2017 FINAL  Final  Culture, respiratory (non-expectorated)     Status: None   Collection Time: 08/18/17  4:27 PM  Result Value Ref Range Status   Specimen Description TRACHEAL ASPIRATE  Final   Special Requests Immunocompromised  Final   Gram Stain   Final    MODERATE DEGENERATED CELLULAR MATERIAL PRESENT NO ORGANISMS SEEN Performed at [facility], [place] [address], [place], [place] [address]    Culture FEW CANDIDA ALBICANS  Final   Report Status 08/21/2017 FINAL  Final    Lab Basic Metabolic Panel: Recent Labs  Lab 08/17/17 1252  08/17/17 1643  08/18/17 0428 08/18/17 1651 08/19/17 0354 09/09/17 0444  NA 134*  --  135  --   --  140 142  K 5.3*  --  5.5*  --   --  4.9 4.9  CL 100  --  101  --   --  109 112*  CO2 16*  --  16*  --   --  23 17*  GLUCOSE 139*  --  142*  --   --  142* 144*  BUN 30*  --  31*  --   --  40* 43*  CREATININE 1.50*  --  1.45*  --   --  1.38* 1.34*  CALCIUM 8.1*  --  8.3*  --   --  8.2* 8.2*  MG  --    < > 2.1 2.2 2.0 2.1 2.2  PHOS  --    < > 3.9 4.1 2.6 2.8 2.2*   < > = values in this interval not displayed.   Liver Function Tests: No results for input(s): AST, ALT, ALKPHOS, BILITOT, PROT, ALBUMIN in the last 168 hours. No results for input(s): LIPASE, AMYLASE in the last 168 hours. No results for input(s): AMMONIA in the last 168 hours. CBC: No results for input(s): WBC, NEUTROABS, HGB, HCT, MCV, PLT in the last 168 hours. Cardiac Enzymes: No results for input(s): CKTOTAL, CKMB, CKMBINDEX, TROPONINI in the last 168 hours. Sepsis Labs: Recent Labs  Lab 08/18/17 0925 08/18/17 1806  LATICACIDVEN 6.3* 3.9*    Procedures/Operations  Right thoracentesis   Simonne Maffucci 08/24/2017, 8:13 AM

## 2017-09-19 NOTE — Progress Notes (Signed)
   09-17-2017 1537  Clinical Encounter Type  Visited With Patient and family together  Visit Type Patient actively dying;Spiritual support  Referral From Nurse  Consult/Referral To Chaplain  Spiritual Encounters  Spiritual Needs Grief support;Emotional;Prayer  Stress Factors  Family Stress Factors Major life changes   Responded to a SCC for EOL.  Family was present.  His fiance shared much about his journey over the last 4 years battling cancer.  The family knows he would not want to stay like this for long.  Fiance indicated they will extubate and move to comfort care later today.  We gathered the family at the bedside and prayed.  Will follow and support as needed. Chaplain Katherene Ponto

## 2017-09-19 DEATH — deceased

## 2019-06-07 IMAGING — CT CT ABD-PELV W/ CM
3 of 11 series · 12 of 46 positions shown, 16 images · IV contrast (Omni 300)
Comparison: CTA chest dated 08/28/2014. CT abdomen/pelvis dated
07/07/2013.

CLINICAL DATA: Pancreatic cancer. Respiratory failure with
shortness of breath, bilateral pleural effusions, and probable
pneumonia. Intubated.

EXAM:
CT ANGIOGRAPHY CHEST
CT ABDOMEN AND PELVIS WITH CONTRAST
TECHNIQUE: Multidetector CT imaging of the chest was performed using the
standard protocol during bolus administration of intravenous
contrast. Multiplanar CT image reconstructions and MIPs were
obtained to evaluate the vascular anatomy. Multidetector CT imaging
of the abdomen and pelvis was performed using the standard protocol
during bolus administration of intravenous contrast.
CONTRAST:  100mL EZON17-PDP IOPAMIDOL (EZON17-PDP) INJECTION 76%

[Series 7: a/p w/ 5mm · axial · 0.65mm/px · z∈[+934,+1059]mm · 2 of 76 slices shown, 5 images]
[im 26/76  soft-tissue]
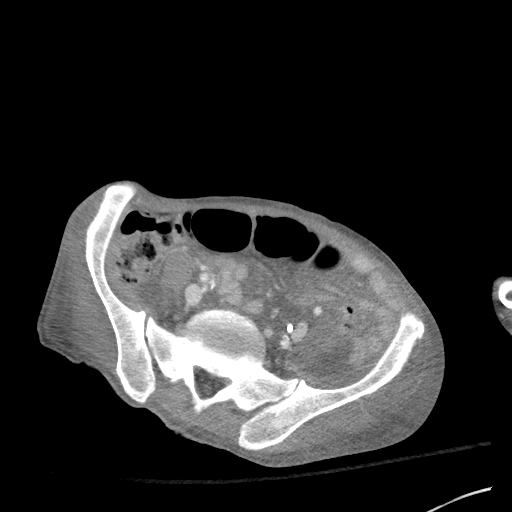
[im 26/76  lung]
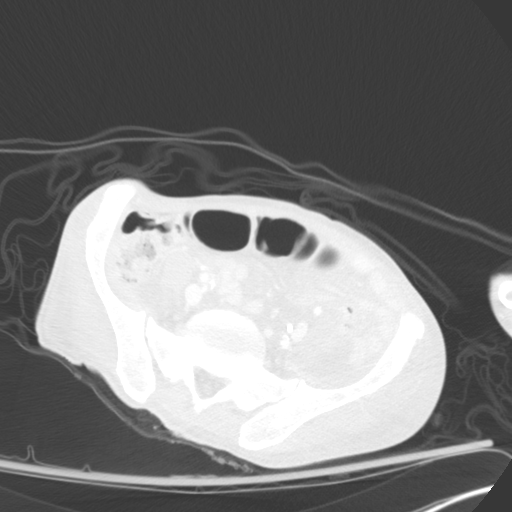
[im 26/76  bone]
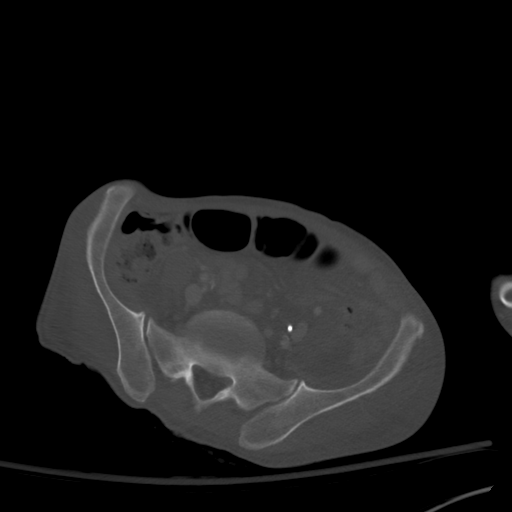
[im 51/76  soft-tissue]
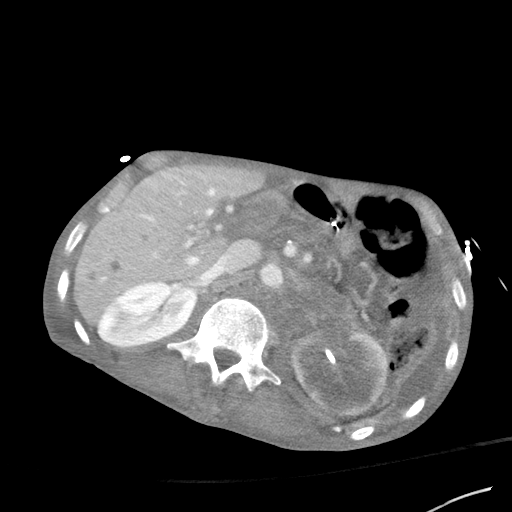
[im 51/76  lung]
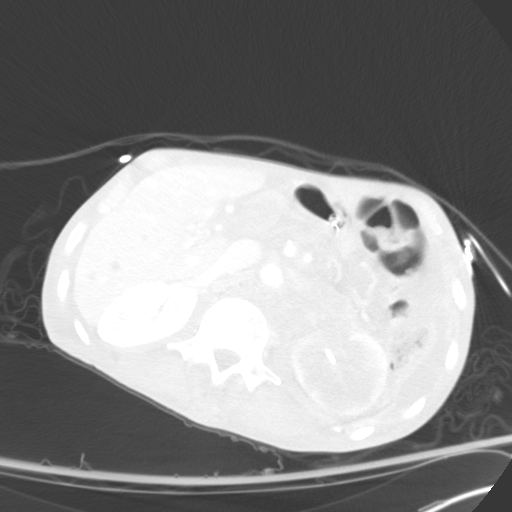

[Series 11: pe thins · axial · 0.68mm/px · z∈[+1108,+1328]mm · 8 of 254 slices shown]
[im 17/254  soft-tissue]
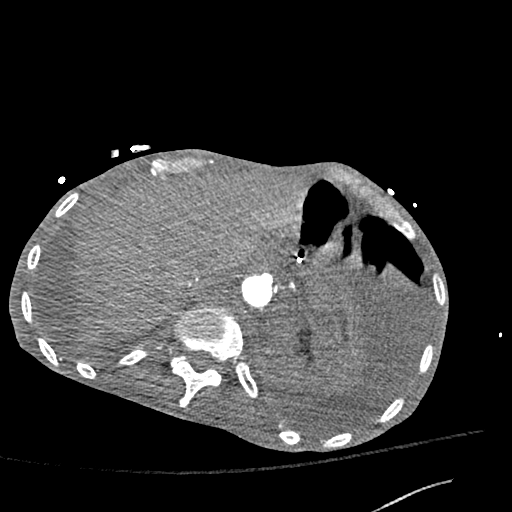
[im 51/254  soft-tissue]
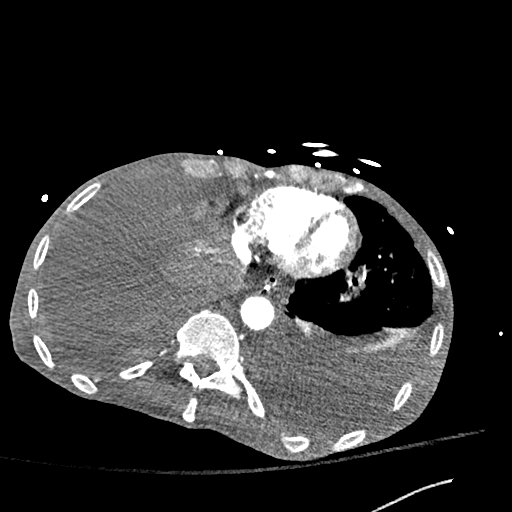
[im 85/254  soft-tissue]
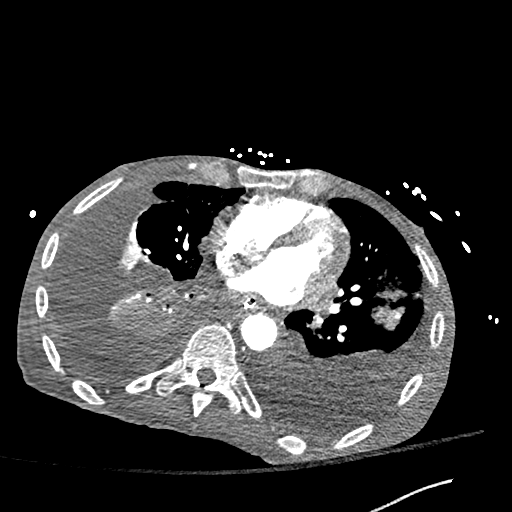
[im 119/254  soft-tissue]
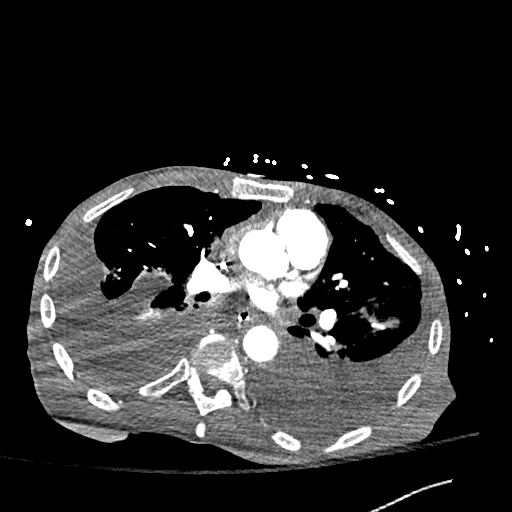
[im 135/254  soft-tissue]
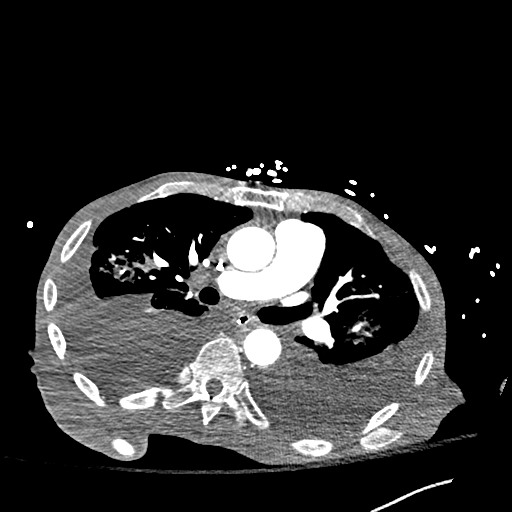
[im 169/254  soft-tissue]
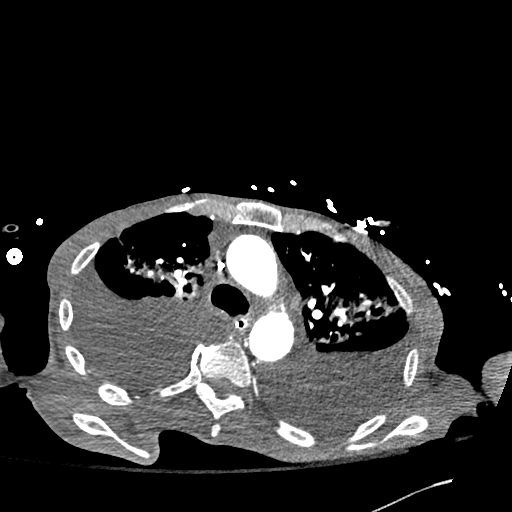
[im 203/254  soft-tissue]
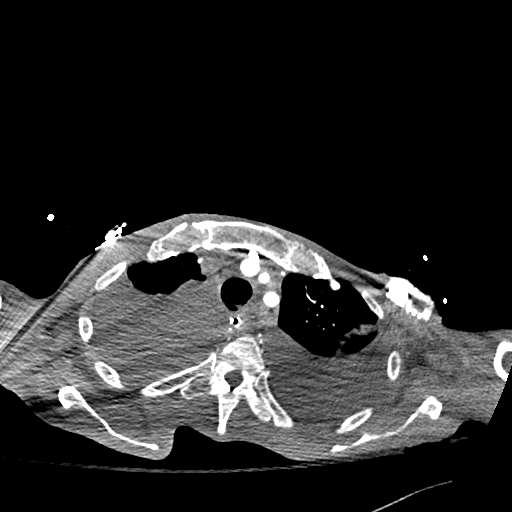
[im 237/254  soft-tissue]
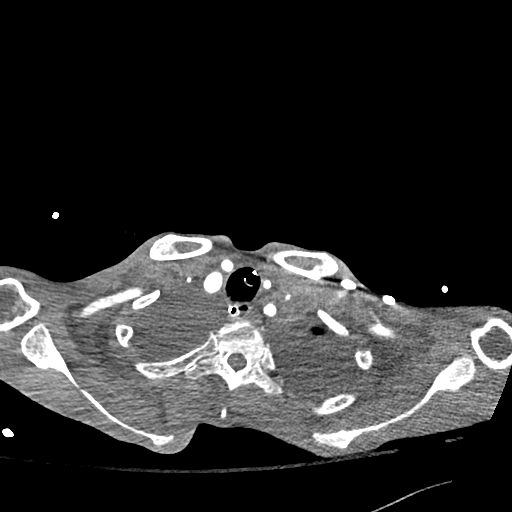

[Series 12: pe 2mm cor · coronal · 0.52mm/px · 2 of 151 slices shown, 3 images]
[im 51/151  soft-tissue]
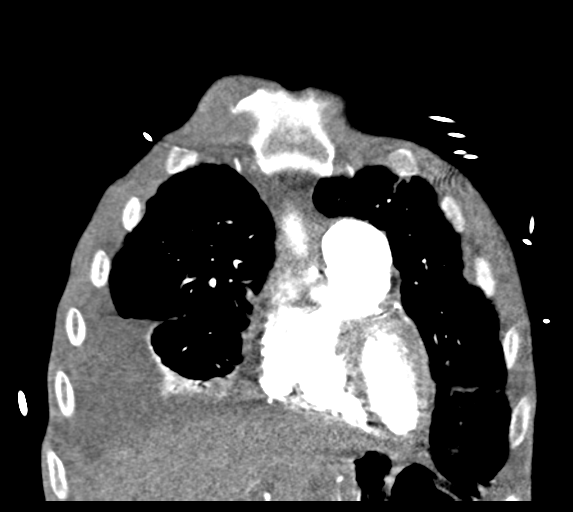
[im 51/151  bone]
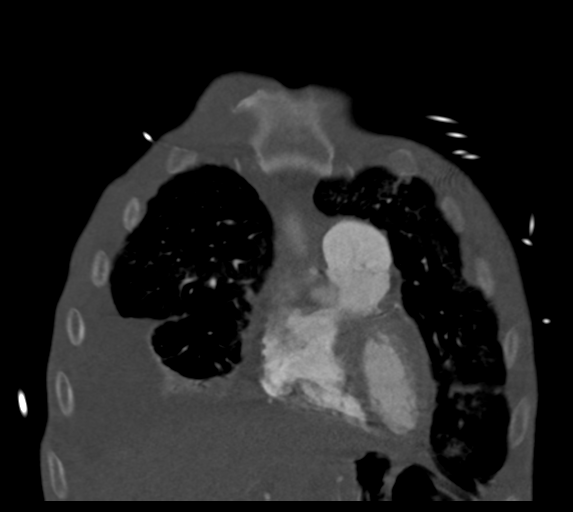
[im 101/151  soft-tissue]
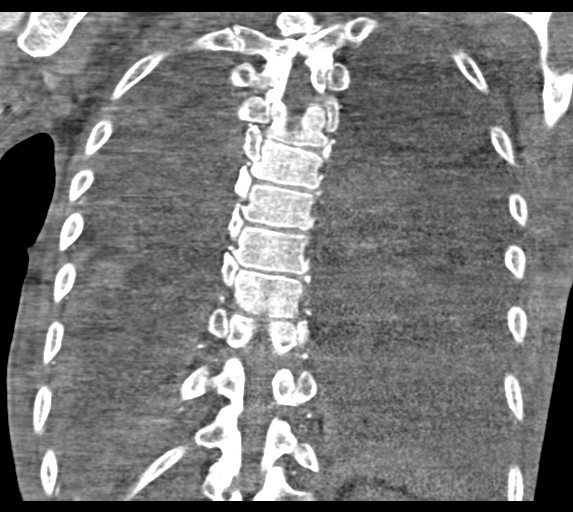

[12 of 46 positions shown; findings below may reference images not displayed]

FINDINGS: CTA CHEST FINDINGS

Cardiovascular: Satisfactory opacification of the bilateral
pulmonary artery to the lobar level. No evidence of pulmonary
embolism.

No evidence of thoracic aortic aneurysm or dissection.

The heart is normal in size.  No pericardial effusion.

Left chest port terminates at the cavoatrial junction.

Mediastinum/Nodes: Due to extreme early arterial phase, there is
poor soft tissue contrast of the mediastinum. As such, nodal status
cannot be assessed. However, there is a 2.9 cm short axis left
supraclavicular nodal mass (series 9/image 5).

Thyroid is incompletely evaluated but grossly unremarkable.

Lungs/Pleura: Endotracheal tube terminates at the thoracic inlet, 7
cm above the carina.

Large bilateral pleural effusions. Suspected complexity on the right
(series 9/image 70), suspicious for malignant pleural effusion.
Suspected pleural-based metastases overlying the bilateral
hemidiaphragms, better evaluated on the CT abdomen/pelvis study
(series 7/images 11 and 24).

Compressive atelectasis in the right middle lobe and left lower
lobe. Possible compressive atelectasis of the right lower lobe,
although underlying mass is suspected (series 9/image 90), worrisome
for tumor.

Additional multifocal patchy and ground-glass opacities in the
bilateral upper lobes, right middle lobe, and superior segment left
lower lobe, favoring multifocal pneumonia over interstitial edema.
Technically speaking, some of this appearance could also reflect
tumor (for example, series 10/image 35 in the left upper lobe),
although this is considered less likely given the overall patchy
appearance.

No pneumothorax.

Musculoskeletal: Visualized osseous structures are within normal
limits.

Review of the MIP images confirms the above findings.

CT ABDOMEN and PELVIS FINDINGS

Limited evaluation due to poor soft tissue contrast in the
abdomen/pelvis.

Hepatobiliary: Innumerable lesions throughout the liver, measuring
up to 9 mm (series 7/image 21), suspicious for metastases.

Status post cholecystectomy. No intrahepatic or extrahepatic ductal
dilatation.

Pancreas: Not visualized, likely surgically absent.

Spleen: Surgically absent.

Adrenals/Urinary Tract: Right adrenal gland is grossly unremarkable.
Left adrenal gland is not discretely visualized.

Right kidney is unremarkable. Infiltrating mass in the left kidney
and extending into the left renal sinus, measuring approximately
x 4.9 cm. Indwelling left double-pigtail ureteral stent.

Bladder is notable for an indwelling Foley catheter and nondependent
gas.

Stomach/Bowel: Suspected distal gastrectomy with gastrojejunostomy.
Enteric tube terminates in jejunum.

No evidence of bowel obstruction.

Large volume colonic stool burden.

Vascular/Lymphatic: No evidence of abdominal aortic aneurysm.

Atherosclerotic calcifications of the abdominal aorta and branch
vessels.

1.7 cm short axis right retrocrural node (series 7/image 13). 2.1 cm
short axis gastrohepatic node (series 7/image 21). 5.8 x 3.9 cm
nodal mass in the left para-aortic region along the course of the
ureteral stent (series 7/image 33).

Reproductive: Prostate is grossly unremarkable.

Other: No definite abdominopelvic ascites, although evaluation is
difficult due to poor soft tissue contrast.

Musculoskeletal: Low density involving the left psoas muscle
(relative to the right; series 7/image [DATE] reflect fatty
atrophy/denervation injury, although additional tumor involvement is
not excluded.

Sclerotic metastasis along the posterior aspect of the L2 vertebral
body (sagittal image 68).

Review of the MIP images confirms the above findings.
IMPRESSION: No evidence of pulmonary embolism.

Multifocal patchy pulmonary opacities, favoring multifocal
pneumonia, less likely interstitial edema. Associated large
bilateral pleural effusions, likely malignant, with associated
pleural-based metastases.

Right lower lobe atelectasis/collapse with suspected underlying
right lower lobe mass. Left supraclavicular nodal metastasis.

Status post pancreatectomy, splenectomy, cholecystectomy, and
suspected partial gastrectomy with gastrojejunostomy.

Widespread hepatic metastases. Retrocrural, upper abdominal, and
para-aortic nodal metastases. Tumor involvement of the left kidney
and ureter, with indwelling ureteral stent.

Sclerotic metastasis at L2.

## 2019-06-09 IMAGING — DX DG CHEST 1V PORT
1 series · 1 of 1 positions shown · non-contrast
Comparison: 08/18/2017

CLINICAL DATA: Respiratory failure

EXAM:
PORTABLE CHEST 1 VIEW

[chest]
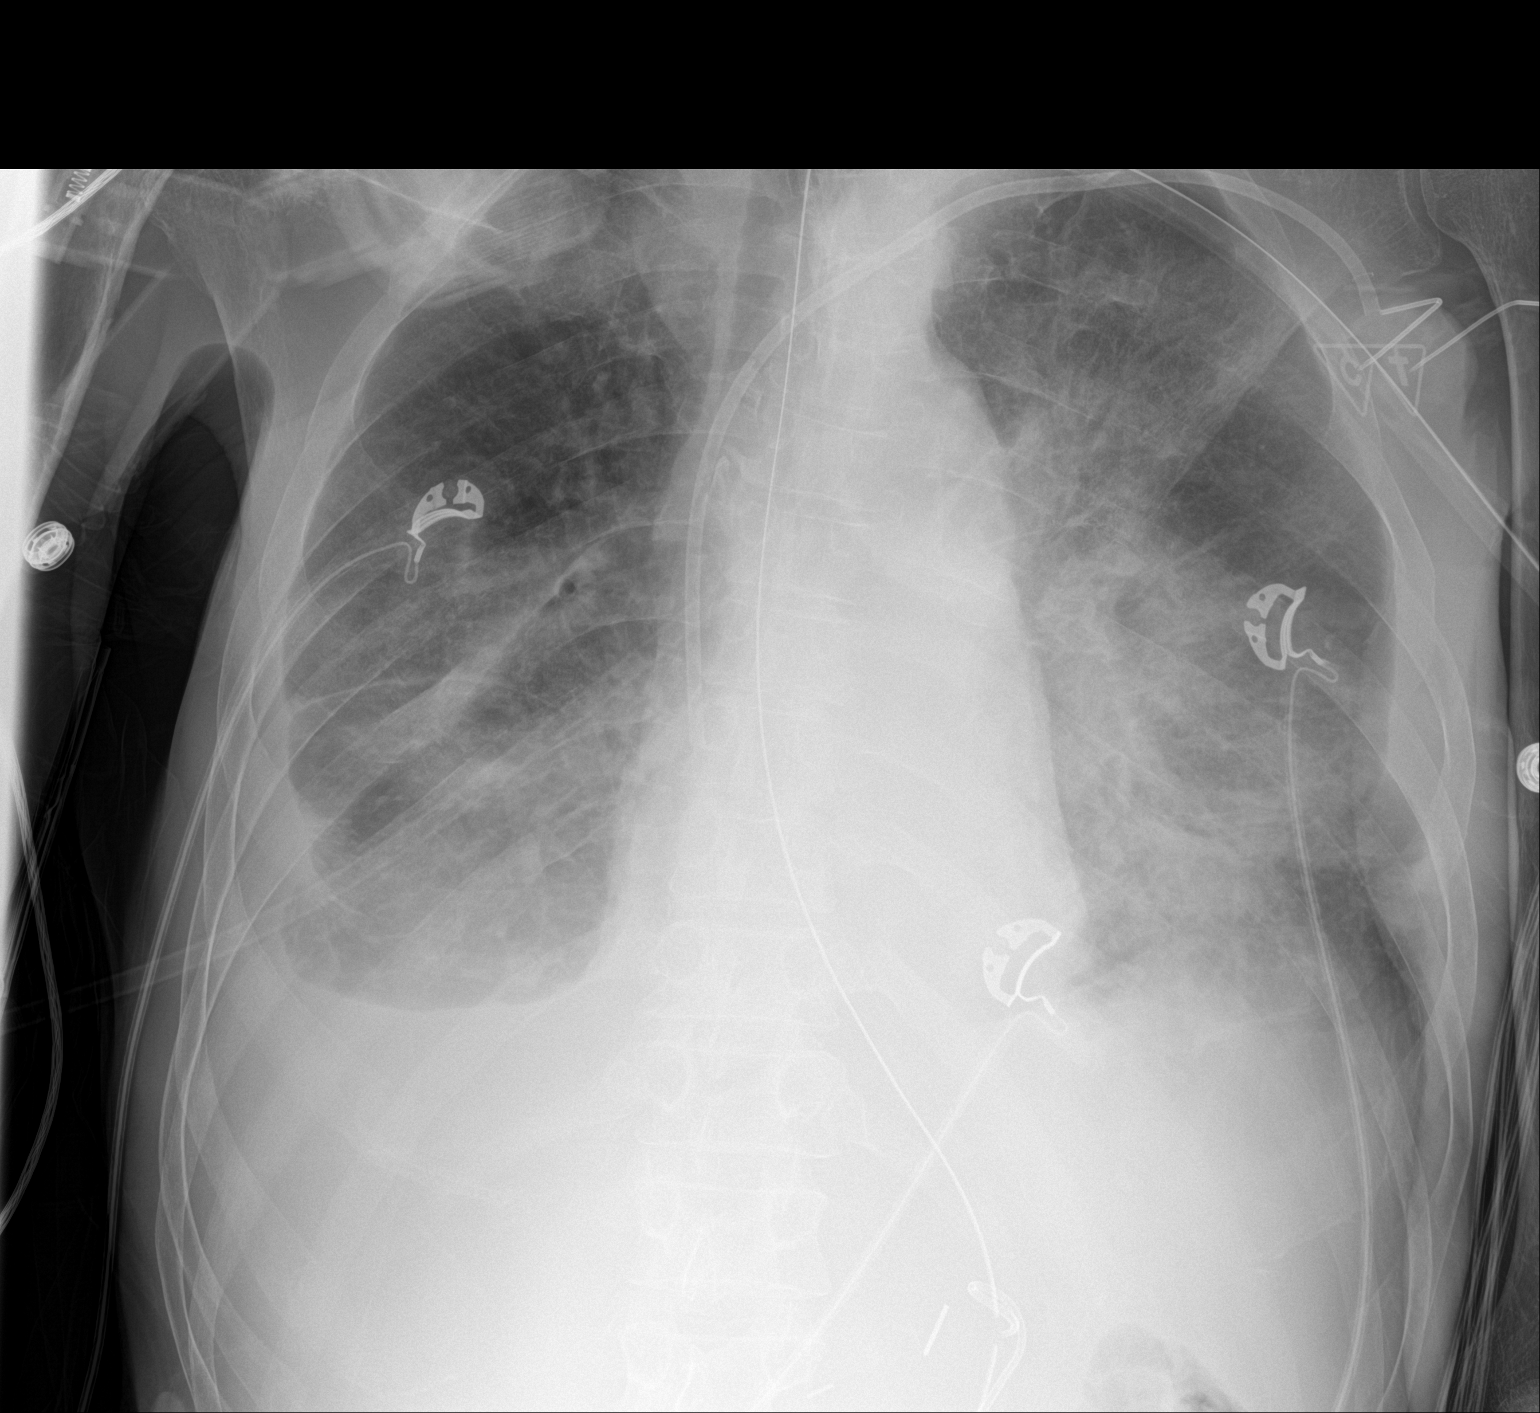

[1 of 1 positions shown; findings below may reference images not displayed]

FINDINGS: Cardiac shadow is within normal limits. Nasogastric catheter,
endotracheal tube and left chest port are again seen and stable.
Right-sided pleural effusion is again identified. Patchy basilar
infiltrates are again seen and stable. The right apical pneumothorax
is somewhat obscured by overlying artifact but likely present. No
new focal abnormality is seen.
IMPRESSION: Stable appearance when compared with the prior exam.
